# Patient Record
Sex: Male | Born: 1935
Health system: Southern US, Community
[De-identification: ages and names within clinical notes are randomized; demographics above are authoritative.]

## PROBLEM LIST (undated history)

## (undated) ENCOUNTER — Emergency Department (HOSPITAL_COMMUNITY): Payer: Medicare Other | Source: Home / Self Care

## (undated) DIAGNOSIS — R220 Localized swelling, mass and lump, head: Secondary | ICD-10-CM

## (undated) DIAGNOSIS — I4891 Unspecified atrial fibrillation: Secondary | ICD-10-CM

## (undated) DIAGNOSIS — K602 Anal fissure, unspecified: Secondary | ICD-10-CM

## (undated) DIAGNOSIS — C61 Malignant neoplasm of prostate: Secondary | ICD-10-CM

## (undated) DIAGNOSIS — R221 Localized swelling, mass and lump, neck: Secondary | ICD-10-CM

## (undated) DIAGNOSIS — I10 Essential (primary) hypertension: Secondary | ICD-10-CM

## (undated) HISTORY — DX: Localized swelling, mass and lump, head: R22.0

## (undated) HISTORY — PX: PROSTATE BIOPSY: SHX241

## (undated) HISTORY — DX: Localized swelling, mass and lump, neck: R22.1

## (undated) HISTORY — DX: Anal fissure, unspecified: K60.2

## (undated) HISTORY — DX: Essential (primary) hypertension: I10

## (undated) HISTORY — DX: Unspecified atrial fibrillation: I48.91

## (undated) HISTORY — PX: CARPAL TUNNEL RELEASE: SHX101

## (undated) HISTORY — PX: CYST REMOVAL HAND: SHX6279

---

## 1998-10-17 HISTORY — PX: CYSTOSCOPY: SUR368

## 2007-08-13 ENCOUNTER — Encounter: Payer: Self-pay | Admitting: Family Medicine

## 2009-06-11 ENCOUNTER — Ambulatory Visit: Payer: Self-pay | Admitting: Family Medicine

## 2009-06-11 DIAGNOSIS — K602 Anal fissure, unspecified: Secondary | ICD-10-CM | POA: Insufficient documentation

## 2009-06-11 DIAGNOSIS — I10 Essential (primary) hypertension: Secondary | ICD-10-CM

## 2009-06-11 DIAGNOSIS — I4891 Unspecified atrial fibrillation: Secondary | ICD-10-CM | POA: Insufficient documentation

## 2009-06-11 HISTORY — DX: Unspecified atrial fibrillation: I48.91

## 2009-06-11 HISTORY — DX: Essential (primary) hypertension: I10

## 2009-06-11 HISTORY — DX: Anal fissure, unspecified: K60.2

## 2009-06-17 ENCOUNTER — Encounter: Payer: Self-pay | Admitting: Family Medicine

## 2009-06-29 ENCOUNTER — Ambulatory Visit: Payer: Self-pay | Admitting: Family Medicine

## 2009-07-27 ENCOUNTER — Ambulatory Visit: Payer: Self-pay | Admitting: Family Medicine

## 2009-07-27 LAB — CONVERTED CEMR LAB: Prothrombin Time: 20.7 s

## 2009-08-11 ENCOUNTER — Ambulatory Visit: Payer: Self-pay | Admitting: Family Medicine

## 2009-08-24 ENCOUNTER — Ambulatory Visit: Payer: Self-pay | Admitting: Family Medicine

## 2009-08-24 LAB — CONVERTED CEMR LAB
INR: 2
Prothrombin Time: 17.6 s

## 2009-09-21 ENCOUNTER — Ambulatory Visit: Payer: Self-pay | Admitting: Family Medicine

## 2009-09-21 LAB — CONVERTED CEMR LAB: Prothrombin Time: 17.9 s

## 2009-10-19 ENCOUNTER — Ambulatory Visit: Payer: Self-pay | Admitting: Family Medicine

## 2009-10-19 LAB — CONVERTED CEMR LAB: INR: 1.9

## 2009-11-19 ENCOUNTER — Ambulatory Visit: Payer: Self-pay | Admitting: Family Medicine

## 2009-11-19 LAB — CONVERTED CEMR LAB
INR: 1.8
Prothrombin Time: 16.4 s

## 2009-12-01 ENCOUNTER — Telehealth: Payer: Self-pay | Admitting: Family Medicine

## 2009-12-16 ENCOUNTER — Ambulatory Visit: Payer: Self-pay | Admitting: Family Medicine

## 2009-12-16 LAB — CONVERTED CEMR LAB: Prothrombin Time: 16.7 s

## 2009-12-31 ENCOUNTER — Encounter: Payer: Self-pay | Admitting: Family Medicine

## 2010-01-20 ENCOUNTER — Ambulatory Visit: Payer: Self-pay | Admitting: Family Medicine

## 2010-01-20 LAB — CONVERTED CEMR LAB: INR: 2

## 2010-02-19 ENCOUNTER — Ambulatory Visit: Payer: Self-pay | Admitting: Family Medicine

## 2010-03-24 ENCOUNTER — Ambulatory Visit: Payer: Self-pay | Admitting: Family Medicine

## 2010-04-20 ENCOUNTER — Telehealth: Payer: Self-pay | Admitting: Family Medicine

## 2010-05-03 ENCOUNTER — Ambulatory Visit: Payer: Self-pay | Admitting: Family Medicine

## 2010-05-31 ENCOUNTER — Ambulatory Visit: Payer: Self-pay | Admitting: Family Medicine

## 2010-05-31 LAB — CONVERTED CEMR LAB: INR: 2.1

## 2010-07-01 ENCOUNTER — Ambulatory Visit: Payer: Self-pay | Admitting: Family Medicine

## 2010-07-01 LAB — CONVERTED CEMR LAB: INR: 2.5

## 2010-07-26 ENCOUNTER — Encounter: Payer: Self-pay | Admitting: Family Medicine

## 2010-08-04 ENCOUNTER — Ambulatory Visit: Payer: Self-pay | Admitting: Family Medicine

## 2010-08-04 LAB — CONVERTED CEMR LAB

## 2010-09-02 ENCOUNTER — Ambulatory Visit: Payer: Self-pay | Admitting: Family Medicine

## 2010-09-15 ENCOUNTER — Encounter: Payer: Self-pay | Admitting: Family Medicine

## 2010-09-20 ENCOUNTER — Encounter: Payer: Self-pay | Admitting: Family Medicine

## 2010-09-20 ENCOUNTER — Encounter: Admission: RE | Admit: 2010-09-20 | Discharge: 2010-09-20 | Payer: Self-pay | Admitting: Family Medicine

## 2010-09-22 ENCOUNTER — Encounter
Admission: RE | Admit: 2010-09-22 | Discharge: 2010-09-22 | Payer: Self-pay | Source: Home / Self Care | Admitting: Family Medicine

## 2010-09-22 ENCOUNTER — Encounter: Payer: Self-pay | Admitting: Family Medicine

## 2010-10-01 ENCOUNTER — Ambulatory Visit: Payer: Self-pay | Admitting: Family Medicine

## 2010-10-01 DIAGNOSIS — R221 Localized swelling, mass and lump, neck: Secondary | ICD-10-CM

## 2010-10-01 DIAGNOSIS — R220 Localized swelling, mass and lump, head: Secondary | ICD-10-CM

## 2010-10-01 HISTORY — DX: Localized swelling, mass and lump, head: R22.0

## 2010-10-05 ENCOUNTER — Ambulatory Visit: Payer: Self-pay | Admitting: Family Medicine

## 2010-10-07 ENCOUNTER — Telehealth: Payer: Self-pay | Admitting: Family Medicine

## 2010-10-12 ENCOUNTER — Encounter: Payer: Self-pay | Admitting: Family Medicine

## 2010-10-12 ENCOUNTER — Ambulatory Visit (HOSPITAL_COMMUNITY)
Admission: RE | Admit: 2010-10-12 | Discharge: 2010-10-12 | Payer: Self-pay | Source: Home / Self Care | Attending: Family Medicine | Admitting: Family Medicine

## 2010-10-14 ENCOUNTER — Telehealth: Payer: Self-pay | Admitting: Family Medicine

## 2010-10-27 ENCOUNTER — Ambulatory Visit
Admission: RE | Admit: 2010-10-27 | Discharge: 2010-10-27 | Payer: Self-pay | Source: Home / Self Care | Attending: Family Medicine | Admitting: Family Medicine

## 2010-11-16 NOTE — Assessment & Plan Note (Signed)
Summary: pt/njr------PT [facility] // RS  Nurse Visit   Allergies: No Known Drug Allergies Laboratory Results   Blood Tests   Date/Time Received: January 20, 2010 11:24 AM Date/Time Reported: January 20, 2010 11:24 AM  PT: 17.5 s   (Normal Range: 10.6-13.4)  INR: 2.0   (Normal Range: 0.88-1.12   Therap INR: 2.0-3.5) Comments: Wynona Canes, CMA  January 20, 2010 11:24 AM    Orders Added: 1)  Est. Patient Level II [99212] 2)  Protime [[id number]]   ANTICOAGULATION RECORD PREVIOUS REGIMEN & LAB RESULTS Anticoagulation Diagnosis:  v58.83,58.61,427.31 on  12/16/2009 Previous INR Goal Range:  2.0-3.0 on  12/16/2009 Previous INR:  1.8 on  12/16/2009 Previous Coumadin Dose(mg):  2.5mg  qd on  12/16/2009 Previous Regimen:  same on  11/19/2009  NEW REGIMEN & LAB RESULTS Current INR: 2.0 Current Coumadin Dose(mg): 2.5 mg qd Regimen: same  (no change)       Repeat testing in: 4 wks MEDICATIONS TOPROL XL 50 MG XR24H-TAB (METOPROLOL SUCCINATE) once daily COUMADIN 2.5 MG TABS (WARFARIN SODIUM) one tab at bedtime  NO generic substitutions   Anticoagulation Visit Questionnaire      Coumadin dose missed/changed:  No      Abnormal Bleeding Symptoms:  No   Any diet changes including alcohol intake, vegetables or greens since the last visit:  No Any illnesses or hospitalizations since the last visit:  No Any signs of clotting since the last visit (including chest discomfort, dizziness, shortness of breath, arm tingling, slurred speech, swelling or redness in leg):  No    Laboratory Results   Blood Tests     PT: 17.5 s   (Normal Range: 10.6-13.4)  INR: 2.0   (Normal Range: 0.88-1.12   Therap INR: 2.0-3.5) Comments: Wynona Canes, CMA  January 20, 2010 11:24 AM

## 2010-11-16 NOTE — Assessment & Plan Note (Signed)
Summary: pt//ccm/pt rsc/cjr  Nurse Visit   Allergies: No Known Drug Allergies  Comments:  Provider: pt states missed two doses of coumadin recently. Laboratory Results   Blood Tests   Date/Time Received: December 16, 2009 1:47 PM  Date/Time Reported: December 16, 2009 1:47 PM   PT: 16.7 s   (Normal Range: 10.6-13.4)  INR: 1.8   (Normal Range: 0.88-1.12   Therap INR: 2.0-3.5) Comments: Wynona Canes, CMA  December 16, 2009 1:48 PM     Orders Added: 1)  Est. Patient Level I [84132] 2)  Protime [[id number]]  Laboratory Results   Blood Tests     PT: 16.7 s   (Normal Range: 10.6-13.4)  INR: 1.8   (Normal Range: 0.88-1.12   Therap INR: 2.0-3.5) Comments: Wynona Canes, CMA  December 16, 2009 1:48 PM       ANTICOAGULATION RECORD PREVIOUS REGIMEN & LAB RESULTS   Previous INR:  1.8 on  11/19/2009 Previous Coumadin Dose(mg):  same on  09/21/2009 Previous Regimen:  same on  11/19/2009  NEW REGIMEN & LAB RESULTS Anticoag. Dx: v58.83,58.61,427.31 Current INR Goal Range: 2.0-3.0 Current INR: 1.8 Current Coumadin Dose(mg): 2.5mg  qd Regimen: same  (no change)       Repeat testing in: 4 weeks MEDICATIONS TOPROL XL 50 MG XR24H-TAB (METOPROLOL SUCCINATE) once daily COUMADIN 2.5 MG TABS (WARFARIN SODIUM) one tab at bedtime  NO generic substitutions   Anticoagulation Visit Questionnaire      Coumadin dose missed/changed:  Yes      Coumadin Dose Comments:  one or more missed dose(s)      Abnormal Bleeding Symptoms:  No   Any diet changes including alcohol intake, vegetables or greens since the last visit:  No Any illnesses or hospitalizations since the last visit:  No Any signs of clotting since the last visit (including chest discomfort, dizziness, shortness of breath, arm tingling, slurred speech, swelling or redness in leg):  No

## 2010-11-16 NOTE — Assessment & Plan Note (Signed)
Summary: PT/CJR  Nurse Visit   Allergies: No Known Drug Allergies Laboratory Results   Blood Tests     PT: 16.4 s   (Normal Range: 10.6-13.4)  INR: 1.8   (Normal Range: 0.88-1.12   Therap INR: 2.0-3.5) Comments: Rita Ohara  November 19, 2009 11:17 AM     Orders Added: 1)  Est. Patient Level I [99211] 2)  Protime [[id number]]   ANTICOAGULATION RECORD PREVIOUS REGIMEN & LAB RESULTS   Previous INR:  1.9 on  10/19/2009 Previous Coumadin Dose(mg):  same on  09/21/2009 Previous Regimen:  same on  07/27/2009  NEW REGIMEN & LAB RESULTS Current INR: 1.8 Regimen: same  Repeat testing in: 1 month  Anticoagulation Visit Questionnaire Coumadin dose missed/changed:  Yes Coumadin Dose Comments:  one or more missed dose(s) Abnormal Bleeding Symptoms:  No  Any diet changes including alcohol intake, vegetables or greens since the last visit:  No Any illnesses or hospitalizations since the last visit:  No Any signs of clotting since the last visit (including chest discomfort, dizziness, shortness of breath, arm tingling, slurred speech, swelling or redness in leg):  No  MEDICATIONS TOPROL XL 50 MG XR24H-TAB (METOPROLOL SUCCINATE) once daily COUMADIN 2.5 MG TABS (WARFARIN SODIUM) one tab at bedtime  NO generic substitutions

## 2010-11-16 NOTE — Letter (Signed)
Summary: [facility]  [facility]   Imported By: Maryln Gottron 07/29/2010 14:59:58  _____________________________________________________________________  External Attachment:    Type:   Image     Comment:   External Document

## 2010-11-16 NOTE — Assessment & Plan Note (Signed)
Summary: pt/njr  Nurse Visit   Allergies: No Known Drug Allergies Laboratory Results   Blood Tests      INR: 2.5   (Normal Range: 0.88-1.12   Therap INR: 2.0-3.5) Comments: Rita Ohara  July 01, 2010 10:18 AM     Orders Added: 1)  Est. Patient Level I [99211] 2)  Protime [[id number]]   ANTICOAGULATION RECORD PREVIOUS REGIMEN & LAB RESULTS Anticoagulation Diagnosis:  v58.83,58.61,427.31 on  12/16/2009 Previous INR Goal Range:  2.0-3.0 on  12/16/2009 Previous INR:  2.1 on  05/31/2010 Previous Coumadin Dose(mg):  2.5 mg qd on  01/20/2010 Previous Regimen:  same on  05/31/2010  NEW REGIMEN & LAB RESULTS Current INR: 2.5 Regimen: same  Repeat testing in: 4 weeks  Anticoagulation Visit Questionnaire Coumadin dose missed/changed:  No Abnormal Bleeding Symptoms:  No  Any diet changes including alcohol intake, vegetables or greens since the last visit:  No Any illnesses or hospitalizations since the last visit:  No Any signs of clotting since the last visit (including chest discomfort, dizziness, shortness of breath, arm tingling, slurred speech, swelling or redness in leg):  No  MEDICATIONS TOPROL XL 50 MG XR24H-TAB (METOPROLOL SUCCINATE) once daily COUMADIN 2.5 MG TABS (WARFARIN SODIUM) one tab at bedtime  NO generic substitutions

## 2010-11-16 NOTE — Assessment & Plan Note (Signed)
Summary: pt will come in fasting/also pt/njr   Vital Signs:  Patient profile:   77 year old male Height:      67.25 inches Weight:      179 pounds BMI:     27.93 Temp:     98.0 degrees F oral Pulse rate:   60 / minute Pulse rhythm:   regular Resp:     12 per minute BP sitting:   130 / 82  (left arm) Cuff size:   regular  Vitals Entered By: Sid Falcon LPN (August 04, 2010 10:16 AM)  Nutrition Counseling: Patient's BMI is greater than 25 and therefore counseled on weight management options.  History of Present Illness: Patient here for Medicare wellness visit and followup chronic medical problems. History of atrial fibrillation and hypertension. Chronic Coumadin therapy. No recent bleeding complications. compliant with all medications. Did have one recent fall couple months ago when he tripped on his feet. No instability. No recent chest pains, dizziness, headaches, or dyspnea  Here for Medicare AWV:  1.   Risk factors based on Past M, S, F history:  Hx hypertension and a fib on chronic coumadin therapy.  Hx macular degeneration.  Remote hx of kidney stones.  Ex-smoker.  Hx elevated PSA followed by urology.  Pos FH heart disease but no premature CAD. 2.   Physical Activities: very active,  active gardener. 3.   Depression/mood: No mood issues.  No depression or anxiety issues. 4.   Hearing:  scheduled to see audiologist next month in [place].  Subjective hearing loss. 6.   Fall Risk: No risk factors for fall.  No orthopedic problems.  Does have some macular degeneration and followed by ophthalmology. 7.   Home Safety: No risks identified.  No throw rugs. 8.   Height, weight, &visual acuity:  height and weight stable. Vision followed very closely by ophthalmologist so no rechecked today. 9.   Counseling: Needs flu vaccine.  He states prior pneumovax after age 49. 10.   Labs ordered based on risk factors: protime 11.           Referral Coordination  No referrals needed at this time.   Needs to continue with eye dr follow up. 12.           Care Plan  PVX up to date.  Cont monthly protimes and repeat today.  Flu vaccine. 13.            Cognitive Assessment  No defecits in short or long term memory.  No problems with reasoning skills.   Hypertension History:      He denies headache, chest pain, palpitations, dyspnea with exertion, orthopnea, PND, peripheral edema, visual symptoms, neurologic problems, syncope, and side effects from treatment.        Positive major cardiovascular risk factors include male age 91 years old or older and hypertension.  Negative major cardiovascular risk factors include non-tobacco-user status.     Clinical Review Panels:  Prevention   Last Colonoscopy:  normal (05/17/1994)  Immunizations   Last Flu Vaccine:  Fluvax 3+ (08/04/2010)  Diabetes Management   Last Flu Vaccine:  Fluvax 3+ (08/04/2010)   Allergies (verified): No Known Drug Allergies  Past History:  Past Medical History: Last updated: 06/11/2009 Blood in stool Chicken pox A fib Hypertension kidney stones  Past Surgical History: Last updated: 06/11/2009 Left hand cyst 2000  Family History: Last updated: 08/04/2010 Heart disease  Social History: Last updated: 06/11/2009 Retired  [employer] Married Previous  Smoker Alcohol use-no  Risk Factors: Smoking Status: never (06/11/2009) PMH-FH-SH reviewed for relevance  Family History: Heart disease  Review of Systems  The patient denies anorexia, fever, weight loss, weight gain, vision loss, decreased hearing, chest pain, syncope, dyspnea on exertion, peripheral edema, prolonged cough, headaches, hemoptysis, abdominal pain, melena, hematochezia, severe indigestion/heartburn, hematuria, muscle weakness, difficulty walking, and depression.    Physical Exam  General:  Well-developed,well-nourished,in no acute distress; alert,appropriate and cooperative throughout examination Head:  Normocephalic and  atraumatic without obvious abnormalities. No apparent alopecia or balding. Eyes:  pupils equal, pupils round, and pupils reactive to light.   Ears:  cerumen impaction bil. Mouth:  Oral mucosa and oropharynx without lesions or exudates.  Teeth in good repair. Neck:  No deformities, masses, or tenderness noted. Lungs:  Normal respiratory effort, chest expands symmetrically. Lungs are clear to auscultation, no crackles or wheezes. Heart:  Normal rate and regular rhythm. S1 and S2 normal without gallop, murmur, click, rub or other extra sounds. Abdomen:  Bowel sounds positive,abdomen soft and non-tender without masses, organomegaly or hernias noted. Rectal:  per urology last month. Msk:  No deformity or scoliosis noted of thoracic or lumbar spine.   Extremities:  No clubbing, cyanosis, edema, or deformity noted with normal full range of motion of all joints.   Neurologic:  alert & oriented X3, cranial nerves II-XII intact, and strength normal in all extremities.   Skin:  no rashes and no suspicious lesions.   Cervical Nodes:  No lymphadenopathy noted Psych:  normally interactive, good eye contact, not anxious appearing, and not depressed appearing.     Impression & Recommendations:  Problem # 1:  Preventive Health Care (ICD-V70.0) flu vaccine given.  Problem # 2:  HYPERTENSION (ICD-401.9)  His updated medication list for this problem includes:    Toprol Xl 50 Mg Xr24h-tab (Metoprolol succinate) ..... Once daily  Problem # 3:  ATRIAL FIBRILLATION (ICD-427.31)  His updated medication list for this problem includes:    Toprol Xl 50 Mg Xr24h-tab (Metoprolol succinate) ..... Once daily    Coumadin 2.5 Mg Tabs (Warfarin sodium) ..... One tab at bedtime  no generic substitutions  Orders: Protime (74259DG)  Complete Medication List: 1)  Toprol Xl 50 Mg Xr24h-tab (Metoprolol succinate) .... Once daily 2)  Coumadin 2.5 Mg Tabs (Warfarin sodium) .... One tab at bedtime  no generic  substitutions  Other Orders: Medicare -1st Annual Wellness Visit [phone]) Flu Vaccine 1yrs + MEDICARE PATIENTS (E3329) Administration Flu vaccine - MCR (G0008)  Hypertension Assessment/Plan:      The patient's hypertensive risk group is category B: At least one risk factor (excluding diabetes) with no target organ damage.  Today's blood pressure is 130/82.    Patient Instructions: 1)  remember to continue with monthly protimes   Orders Added: 1)  Medicare -1st Annual Wellness Visit [G0438] 2)  Est. Patient Level III [51884] 3)  Protime [[id number]] 4)  Flu Vaccine 23yrs + MEDICARE PATIENTS [Q2039] 5)  Administration Flu vaccine - MCR [G0008]      Flu Vaccine Consent Questions     Do you have a history of severe allergic reactions to this vaccine? no    Any prior history of allergic reactions to egg and/or gelatin? no    Do you have a sensitivity to the preservative Thimersol? no    Do you have a past history of Guillan-Barre Syndrome? no    Do you currently have an acute febrile illness? no    Have you ever  had a severe reaction to latex? no    Vaccine information given and explained to patient? yes    Are you currently pregnant? no    Lot Number:[id number]   Exp Date:04/16/2011   Site Given  Left Deltoid [email or web address]   ANTICOAGULATION RECORD PREVIOUS REGIMEN & LAB RESULTS Anticoagulation Diagnosis:  v58.83,58.61,427.31 on  12/16/2009 Previous INR Goal Range:  2.0-3.0 on  12/16/2009 Previous INR:  2.5 on  07/01/2010 Previous Coumadin Dose(mg):  2.5 mg qd on  01/20/2010 Previous Regimen:  same on  07/01/2010  NEW REGIMEN & LAB RESULTS Current INR: 2.2. Regimen: same  Repeat testing in: 4 weeks  Anticoagulation Visit Questionnaire Coumadin dose missed/changed:  No Abnormal Bleeding Symptoms:  No  Any diet changes including alcohol intake, vegetables or greens since the last visit:  No Any illnesses or hospitalizations since the last visit:  No Any signs of clotting since  the last visit (including chest discomfort, dizziness, shortness of breath, arm tingling, slurred speech, swelling or redness in leg):  No  MEDICATIONS TOPROL XL 50 MG XR24H-TAB (METOPROLOL SUCCINATE) once daily COUMADIN 2.5 MG TABS (WARFARIN SODIUM) one tab at bedtime  NO generic substitutions    Laboratory Results   Blood Tests      INR: 2.2.   (Normal Range: 0.88-1.12   Therap INR: 2.0-3.5)

## 2010-11-16 NOTE — Assessment & Plan Note (Signed)
Summary: PT/NJR  Nurse Visit   Allergies: No Known Drug Allergies Laboratory Results   Blood Tests     PT: 16.8 s   (Normal Range: 10.6-13.4)  INR: 1.9   (Normal Range: 0.88-1.12   Therap INR: 2.0-3.5) Comments: Joanne Chars CMA  October 19, 2009 10:56 AM     Orders Added: 1)  Est. Patient Level I [99211] 2)  Protime [[id number]]   ANTICOAGULATION RECORD PREVIOUS REGIMEN & LAB RESULTS   Previous INR:  2.1 on  09/21/2009 Previous Coumadin Dose(mg):  same on  09/21/2009 Previous Regimen:  same on  07/27/2009  NEW REGIMEN & LAB RESULTS Current INR: 1.9 Regimen: same  (no change)   Anticoagulation Visit Questionnaire Coumadin dose missed/changed:  Yes Coumadin Dose Comments:  one or more missed dose(s) Abnormal Bleeding Symptoms:  No  Any diet changes including alcohol intake, vegetables or greens since the last visit:  No Any illnesses or hospitalizations since the last visit:  Yes      Recent Illness/Hospitalizations:  "eye injections" Pt. has been taken Warfarin and not coumadin since 07/31/09 until 10/21/09 due to mail-order mix up. Any signs of clotting since the last visit (including chest discomfort, dizziness, shortness of breath, arm tingling, slurred speech, swelling or redness in leg):  No  MEDICATIONS TOPROL XL 50 MG XR24H-TAB (METOPROLOL SUCCINATE) once daily COUMADIN 2.5 MG TABS (WARFARIN SODIUM) one tab at bedtime  NO generic substitutions

## 2010-11-16 NOTE — Letter (Signed)
Summary: [facility]  [facility]   Imported By: Maryln Gottron 01/06/2010 15:33:44  _____________________________________________________________________  External Attachment:    Type:   Image     Comment:   External Document

## 2010-11-16 NOTE — Progress Notes (Signed)
Summary: Pt changed mail order RX to [facility] - refill of Toprol  Phone Note Call from Patient Call back at Home Phone [phone]   Caller: Patient Summary of Call: Pt called and is changing mail order pharmacies to [facility]. Pt is req refill of Toprol 50mg . Please send to [facility].  Initial call taken by: Lucy Antigua,  December 01, 2009 11:48 AM    Prescriptions: TOPROL XL 50 MG XR24H-TAB (METOPROLOL SUCCINATE) once daily  #90 x 3   Entered by:   Sid Falcon LPN   Authorized by:   Evelena Peat MD   Signed by:   Sid Falcon LPN on 14/78/2956   Method used:   Faxed to ...       [facility] Environmental education officer)       [address] [address]       [place], [place]  [address]       Ph: [phone]       Fax: [phone]   RxID:   [id number]

## 2010-11-16 NOTE — Assessment & Plan Note (Signed)
Summary: pt/njr  Nurse Visit   Allergies: No Known Drug Allergies Laboratory Results   Blood Tests      INR: 2.6   (Normal Range: 0.88-1.12   Therap INR: 2.0-3.5)    Orders Added: 1)  Est. Patient Level I [32671] 2)  Protime [[id number]]   ANTICOAGULATION RECORD PREVIOUS REGIMEN & LAB RESULTS Anticoagulation Diagnosis:  v58.83,58.61,427.31 on  12/16/2009 Previous INR Goal Range:  2.0-3.0 on  12/16/2009 Previous INR:  2.1 on  02/19/2010 Previous Coumadin Dose(mg):  2.5 mg qd on  01/20/2010 Previous Regimen:  same on  11/19/2009  NEW REGIMEN & LAB RESULTS Current INR: 2.6 Regimen: same  (no change)       Repeat testing in: 4 weeks MEDICATIONS TOPROL XL 50 MG XR24H-TAB (METOPROLOL SUCCINATE) once daily COUMADIN 2.5 MG TABS (WARFARIN SODIUM) one tab at bedtime  NO generic substitutions   Anticoagulation Visit Questionnaire      Coumadin dose missed/changed:  No      Abnormal Bleeding Symptoms:  No   Any diet changes including alcohol intake, vegetables or greens since the last visit:  No Any illnesses or hospitalizations since the last visit:  No Any signs of clotting since the last visit (including chest discomfort, dizziness, shortness of breath, arm tingling, slurred speech, swelling or redness in leg):  No

## 2010-11-16 NOTE — Assessment & Plan Note (Signed)
Summary: PT/NJR  Nurse Visit   Allergies: No Known Drug Allergies Laboratory Results   Blood Tests   Date/Time Received: Feb 19, 2010 1:37 PM  Date/Time Reported: Feb 19, 2010 1:37 PM   PT: 17.9 s   (Normal Range: 10.6-13.4)  INR: 2.1   (Normal Range: 0.88-1.12   Therap INR: 2.0-3.5) Comments: Wynona Canes, CMA  Feb 19, 2010 1:37 PM     Orders Added: 1)  Est. Patient Level I [61607] 2)  Protime [[id number]]  Laboratory Results   Blood Tests     PT: 17.9 s   (Normal Range: 10.6-13.4)  INR: 2.1   (Normal Range: 0.88-1.12   Therap INR: 2.0-3.5) Comments: Wynona Canes, CMA  Feb 19, 2010 1:37 PM       ANTICOAGULATION RECORD PREVIOUS REGIMEN & LAB RESULTS Anticoagulation Diagnosis:  v58.83,58.61,427.31 on  12/16/2009 Previous INR Goal Range:  2.0-3.0 on  12/16/2009 Previous INR:  2.0 on  01/20/2010 Previous Coumadin Dose(mg):  2.5 mg qd on  01/20/2010 Previous Regimen:  same on  11/19/2009  NEW REGIMEN & LAB RESULTS Current INR: 2.1 Regimen: same  (no change)       Repeat testing in: 4 weeks MEDICATIONS TOPROL XL 50 MG XR24H-TAB (METOPROLOL SUCCINATE) once daily COUMADIN 2.5 MG TABS (WARFARIN SODIUM) one tab at bedtime  NO generic substitutions   Anticoagulation Visit Questionnaire      Coumadin dose missed/changed:  No      Abnormal Bleeding Symptoms:  No   Any diet changes including alcohol intake, vegetables or greens since the last visit:  No Any illnesses or hospitalizations since the last visit:  No Any signs of clotting since the last visit (including chest discomfort, dizziness, shortness of breath, arm tingling, slurred speech, swelling or redness in leg):  No

## 2010-11-16 NOTE — Assessment & Plan Note (Signed)
Summary: pt//ccm wife rsc/njr  Nurse Visit   Allergies: No Known Drug Allergies Laboratory Results   Blood Tests      INR: 2.1   (Normal Range: 0.88-1.12   Therap INR: 2.0-3.5) Comments: Rita Ohara  May 03, 2010 9:59 AM     Orders Added: 1)  Est. Patient Level I [99211] 2)  Protime [[id number]]   ANTICOAGULATION RECORD PREVIOUS REGIMEN & LAB RESULTS Anticoagulation Diagnosis:  v58.83,58.61,427.31 on  12/16/2009 Previous INR Goal Range:  2.0-3.0 on  12/16/2009 Previous INR:  2.6 on  03/24/2010 Previous Coumadin Dose(mg):  2.5 mg qd on  01/20/2010 Previous Regimen:  same on  11/19/2009  NEW REGIMEN & LAB RESULTS Current INR: 2.1 Regimen: same  Repeat testing in: 4 weeks  Anticoagulation Visit Questionnaire Coumadin dose missed/changed:  No Abnormal Bleeding Symptoms:  No  Any diet changes including alcohol intake, vegetables or greens since the last visit:  No Any illnesses or hospitalizations since the last visit:  No Any signs of clotting since the last visit (including chest discomfort, dizziness, shortness of breath, arm tingling, slurred speech, swelling or redness in leg):  No  MEDICATIONS TOPROL XL 50 MG XR24H-TAB (METOPROLOL SUCCINATE) once daily COUMADIN 2.5 MG TABS (WARFARIN SODIUM) one tab at bedtime  NO generic substitutions

## 2010-11-16 NOTE — Assessment & Plan Note (Signed)
Summary: pt labs//ccm  Nurse Visit   Allergies: No Known Drug Allergies Laboratory Results   Blood Tests      INR: 2.1   (Normal Range: 0.88-1.12   Therap INR: 2.0-3.5) Comments: Kathrynn Speed CMA  May 31, 2010 10:30 AM     Orders Added: 1)  Est. Patient Level I [99211] 2)  Protime [[id number]]   ANTICOAGULATION RECORD PREVIOUS REGIMEN & LAB RESULTS Anticoagulation Diagnosis:  v58.83,58.61,427.31 on  12/16/2009 Previous INR Goal Range:  2.0-3.0 on  12/16/2009 Previous INR:  2.1 on  05/03/2010 Previous Coumadin Dose(mg):  2.5 mg qd on  01/20/2010 Previous Regimen:  same on  05/03/2010  NEW REGIMEN & LAB RESULTS Current INR: 2.1 Regimen: same  Repeat testing in: 4 weeks  Anticoagulation Visit Questionnaire Coumadin dose missed/changed:  Yes Coumadin Dose Comments:  Maybe one Abnormal Bleeding Symptoms:  No  Any diet changes including alcohol intake, vegetables or greens since the last visit:  No Any illnesses or hospitalizations since the last visit:  No Any signs of clotting since the last visit (including chest discomfort, dizziness, shortness of breath, arm tingling, slurred speech, swelling or redness in leg):  No  MEDICATIONS TOPROL XL 50 MG XR24H-TAB (METOPROLOL SUCCINATE) once daily COUMADIN 2.5 MG TABS (WARFARIN SODIUM) one tab at bedtime  NO generic substitutions

## 2010-11-16 NOTE — Progress Notes (Signed)
Summary: Pt req Brand Name Only Coumadin  Phone Note Refill Request   Refills Requested: Medication #1:  COUMADIN 2.5 MG TABS one tab at bedtime  NO generic substitutions.   Dosage confirmed as above?Dosage Confirmed   Brand Name Necessary? Yes Pt called and wants to be sure that Coumadin is Brand Name Only. Please call in to [facility] in [place] [phone]    Method Requested: Telephone to Pharmacy Initial call taken by: Lucy Antigua,  April 20, 2010 10:28 AM    Prescriptions: COUMADIN 2.5 MG TABS (WARFARIN SODIUM) one tab at bedtime  NO generic substitutions  #30 x 6   Entered by:   Sid Falcon LPN   Authorized by:   Evelena Peat MD   Signed by:   Sid Falcon LPN on 09/81/1914   Method used:   Telephoned to ...       [facility]* (retail)       [address]       [place], [place]  [address]       Ph: [phone]       Fax: [phone]   RxID:   [id number]

## 2010-11-18 NOTE — Assessment & Plan Note (Signed)
Summary: lump on shoulder/njr   Vital Signs:  Patient profile:   77 year old male Weight:      179 pounds Temp:     97.7 degrees F oral BP sitting:   150 / 80  (left arm) Cuff size:   regular  Vitals Entered By: Sid Falcon LPN (October 01, 2010 10:07 AM)  History of Present Illness: Patient seen with left lower neck and supraclavicular mass.  History is that he fell on 09/15/2000 against the door frame. Had left shoulder pain. Plain x-rays revealed degenerative arthritis at the a.c. joint but no acute finding. Patient was noted to incidentally have mass as above. Was sent for ultrasound of the neck which revealed 4 cm cystic and solid left neck mass. MRI recommended with and without contrast to further evaluate.  MRI scan revealed probably cystic mass which is indeterminate. Differential included lymphocele, atypical brachial cleft cyst, cystic hygroma or cystic neoplasm. Fine-needle aspiration for cytology recommended to further evaluate.  Patient not had any other adenopathy or other masses. No appetite or weight changes. No fevers, chills, or night sweats. No cough.  Allergies (verified): No Known Drug Allergies  Past History:  Past Medical History: Last updated: 06/11/2009 Blood in stool Chicken pox A fib Hypertension kidney stones  Past Surgical History: Last updated: 06/11/2009 Left hand cyst 2000  Family History: Last updated: 08/04/2010 Heart disease  Social History: Last updated: 06/11/2009 Retired  [employer] Married Previous Smoker Alcohol use-no  Risk Factors: Smoking Status: never (06/11/2009)  Review of Systems  The patient denies anorexia, fever, weight loss, hoarseness, chest pain, syncope, dyspnea on exertion, peripheral edema, prolonged cough, headaches, hemoptysis, abdominal pain, melena, hematochezia, severe indigestion/heartburn, and muscle weakness.    Physical Exam  General:  Well-developed,well-nourished,in no acute  distress; alert,appropriate and cooperative throughout examination Mouth:  Oral mucosa and oropharynx without lesions or exudates.  Teeth in good repair. Neck:  no adenopathy appreciated.  approximately 4 cm movable nontender well rounded mass left lower neck Lungs:  Normal respiratory effort, chest expands symmetrically. Lungs are clear to auscultation, no crackles or wheezes. Heart:  Normal rate and regular rhythm. S1 and S2 normal without gallop, murmur, click, rub or other extra sounds. Abdomen:  soft and non-tender.   Extremities:  no edema   Impression & Recommendations:  Problem # 1:  NECK MASS (ICD-784.2) Assessment New Hopefully benign.  Since indeterminate from MRI, set up FNA. set up fine needle aspiration for cytology  Orders: Radiology Referral (Radiology)  Problem # 2:  ATRIAL FIBRILLATION (ICD-427.31)  His updated medication list for this problem includes:    Toprol Xl 50 Mg Xr24h-tab (Metoprolol succinate) ..... Once daily    Coumadin 2.5 Mg Tabs (Warfarin sodium) ..... One tab at bedtime  no generic substitutions  Complete Medication List: 1)  Toprol Xl 50 Mg Xr24h-tab (Metoprolol succinate) .... Once daily 2)  Coumadin 2.5 Mg Tabs (Warfarin sodium) .... One tab at bedtime  no generic substitutions  Patient Instructions: 1)  We will call you regarding tissue biopsy of L neck mass.   Orders Added: 1)  Radiology Referral [Radiology] 2)  Est. Patient Level IV [04540]

## 2010-11-18 NOTE — Progress Notes (Signed)
  Phone Note Outgoing Call   Summary of Call: spoke with pt.  L supraclav cyst bxed and path received.  No malignant cells. pt notified.  He is back on Coumadin at this time and will repeat INR 2 weeks. Initial call taken by: Evelena Peat MD,  October 14, 2010 5:20 PM

## 2010-11-18 NOTE — Progress Notes (Signed)
Summary: D/C coumadin order request  Phone Note From Other Clinic   Caller: Referral Coordinator Call For: Burchette Summary of Call: Anna from [facility] called, tenatively has scheduled left neck mas tissue sampling for Tues, 12/27.  Pt needs to be off Coumadin X 4 days.  If approved, requesting written order faxed to her today.  Pt is scheduled for coumadin lab check Tuesday 12/27.  Should this be cancelled for now? Stop Coumadin 12/23, may resume 12/27 evening (pt reports taking coumadin before bedtime)  Fax # [phone], to talk to [name], call [phone] Initial call taken by: Sid Falcon LPN,  October 07, 2010 11:41 AM  Follow-up for Phone Call        written to stop 4 days prior. Follow-up by: Evelena Peat MD,  October 07, 2010 11:54 AM  Additional Follow-up for Phone Call Additional follow up Details #1::        Faxed, confirmation received. Pt informed he is not to come to Lab appt on 12/27 for coumadin check.  He must wait 2 weeks and then check-it Additional Follow-up by: Sid Falcon LPN,  October 07, 2010 5:41 PM

## 2010-11-18 NOTE — Assessment & Plan Note (Signed)
Summary: pt/njr  Nurse Visit   Allergies: No Known Drug Allergies Laboratory Results   Blood Tests   Date/Time Received: October 27, 2010 11:09 AM  Date/Time Reported: October 27, 2010 11:09 AM    INR: 2.0   (Normal Range: 0.88-1.12   Therap INR: 2.0-3.5) Comments: Wynona Canes, CMA  October 27, 2010 11:10 AM     Orders Added: 1)  New Patient Level I [99201] 2)  Protime [60454UJ]  Laboratory Results   Blood Tests      INR: 2.0   (Normal Range: 0.88-1.12   Therap INR: 2.0-3.5) Comments: Wynona Canes, CMA  October 27, 2010 11:10 AM       ANTICOAGULATION RECORD PREVIOUS REGIMEN & LAB RESULTS Anticoagulation Diagnosis:  v58.83,58.61,427.31 on  12/16/2009 Previous INR Goal Range:  2.0-3.0 on  12/16/2009 Previous INR:  1.9 on  09/02/2010 Previous Coumadin Dose(mg):  2.5 mg qd on  01/20/2010 Previous Regimen:  same on  09/02/2010  NEW REGIMEN & LAB RESULTS Current INR: 2.0 Regimen: same  (no change) Coagulation Comments: Missed 5 days due to a procedure performed.      Repeat testing in: 4 weeks MEDICATIONS TOPROL XL 50 MG XR24H-TAB (METOPROLOL SUCCINATE) once daily COUMADIN 2.5 MG TABS (WARFARIN SODIUM) one tab at bedtime  NO generic substitutions   Anticoagulation Visit Questionnaire      Coumadin dose missed/changed:  Yes      Coumadin Dose Comments:  one or more missed dose(s)      Abnormal Bleeding Symptoms:  No   Any diet changes including alcohol intake, vegetables or greens since the last visit:  No Any illnesses or hospitalizations since the last visit:  No Any signs of clotting since the last visit (including chest discomfort, dizziness, shortness of breath, arm tingling, slurred speech, swelling or redness in leg):  No

## 2010-11-18 NOTE — Letter (Signed)
Summary: [facility]  [facility]   Imported By: Maryln Gottron 10/22/2010 15:30:09  _____________________________________________________________________  External Attachment:    Type:   Image     Comment:   External Document

## 2010-11-26 ENCOUNTER — Other Ambulatory Visit: Payer: Self-pay

## 2010-12-01 ENCOUNTER — Other Ambulatory Visit (INDEPENDENT_AMBULATORY_CARE_PROVIDER_SITE_OTHER): Payer: Medicare Other | Admitting: Family Medicine

## 2010-12-01 DIAGNOSIS — I4891 Unspecified atrial fibrillation: Secondary | ICD-10-CM

## 2010-12-01 NOTE — Patient Instructions (Signed)
.  aticoag

## 2010-12-27 LAB — CBC
HCT: 38.3 % — ABNORMAL LOW (ref 39.0–52.0)
Hemoglobin: 12.6 g/dL — ABNORMAL LOW (ref 13.0–17.0)
MCH: 26.2 pg (ref 26.0–34.0)
RBC: 4.81 MIL/uL (ref 4.22–5.81)
WBC: 7 10*3/uL (ref 4.0–10.5)

## 2010-12-27 LAB — APTT: aPTT: 33 seconds (ref 24–37)

## 2010-12-29 ENCOUNTER — Ambulatory Visit: Payer: Medicare Other

## 2010-12-29 ENCOUNTER — Other Ambulatory Visit: Payer: Self-pay | Admitting: *Deleted

## 2010-12-29 DIAGNOSIS — I4891 Unspecified atrial fibrillation: Secondary | ICD-10-CM

## 2010-12-29 DIAGNOSIS — I10 Essential (primary) hypertension: Secondary | ICD-10-CM

## 2010-12-29 LAB — POCT INR: INR: 1.8

## 2010-12-29 MED ORDER — METOPROLOL SUCCINATE ER 50 MG PO TB24: 50.0000 mg | ORAL_TABLET | Freq: Every day | ORAL | Status: DC

## 2010-12-30 ENCOUNTER — Other Ambulatory Visit: Payer: Self-pay | Admitting: *Deleted

## 2010-12-30 DIAGNOSIS — I4891 Unspecified atrial fibrillation: Secondary | ICD-10-CM

## 2010-12-30 MED ORDER — WARFARIN SODIUM 2.5 MG PO TABS: 2.5000 mg | ORAL_TABLET | Freq: Every day | ORAL | Status: DC

## 2010-12-31 ENCOUNTER — Other Ambulatory Visit: Payer: Self-pay | Admitting: Family Medicine

## 2010-12-31 ENCOUNTER — Telehealth: Payer: Self-pay | Admitting: Family Medicine

## 2010-12-31 NOTE — Telephone Encounter (Signed)
Refill coumadin at [facility] pharmacy.

## 2010-12-31 NOTE — Telephone Encounter (Signed)
Rx was sent electronically yesterday, Rx called in today as well, pt informed

## 2011-01-31 ENCOUNTER — Other Ambulatory Visit: Payer: Self-pay | Admitting: *Deleted

## 2011-01-31 ENCOUNTER — Other Ambulatory Visit (INDEPENDENT_AMBULATORY_CARE_PROVIDER_SITE_OTHER): Payer: Medicare Other

## 2011-01-31 DIAGNOSIS — I4891 Unspecified atrial fibrillation: Secondary | ICD-10-CM

## 2011-01-31 LAB — POCT INR: INR: 2.2

## 2011-01-31 MED ORDER — WARFARIN SODIUM 2.5 MG PO TABS: 2.5000 mg | ORAL_TABLET | Freq: Every day | ORAL | Status: DC

## 2011-01-31 MED ORDER — COUMADIN 2.5 MG PO TABS: 2.5000 mg | ORAL_TABLET | Freq: Every day | ORAL | Status: DC

## 2011-01-31 NOTE — Patient Instructions (Signed)
Same dose, 2.5 mg Everyday, check in 4 weeks

## 2011-01-31 NOTE — Telephone Encounter (Signed)
Pt here for PT/INR. Requesting non generic Coumadin 2.5 from now on, sent new Rx for Coumadin DAW per pt request sent to [facility]

## 2011-03-02 ENCOUNTER — Ambulatory Visit (INDEPENDENT_AMBULATORY_CARE_PROVIDER_SITE_OTHER): Payer: Medicare Other | Admitting: Family Medicine

## 2011-03-02 DIAGNOSIS — I4891 Unspecified atrial fibrillation: Secondary | ICD-10-CM

## 2011-03-02 NOTE — Patient Instructions (Signed)
Same dose, 2.5 mg Everyday, check in 4 weeks  

## 2011-03-30 ENCOUNTER — Ambulatory Visit (INDEPENDENT_AMBULATORY_CARE_PROVIDER_SITE_OTHER): Payer: Medicare Other | Admitting: Family Medicine

## 2011-03-30 DIAGNOSIS — I4891 Unspecified atrial fibrillation: Secondary | ICD-10-CM

## 2011-03-30 LAB — POCT INR: INR: 1.8

## 2011-03-30 NOTE — Patient Instructions (Signed)
Same dose 

## 2011-04-26 ENCOUNTER — Ambulatory Visit (INDEPENDENT_AMBULATORY_CARE_PROVIDER_SITE_OTHER): Payer: Medicare Other | Admitting: Family Medicine

## 2011-04-26 DIAGNOSIS — I4891 Unspecified atrial fibrillation: Secondary | ICD-10-CM

## 2011-04-26 NOTE — Patient Instructions (Signed)
Same dose Check 4 weeks

## 2011-05-27 ENCOUNTER — Ambulatory Visit (INDEPENDENT_AMBULATORY_CARE_PROVIDER_SITE_OTHER): Payer: Medicare Other | Admitting: Family Medicine

## 2011-05-27 DIAGNOSIS — I4891 Unspecified atrial fibrillation: Secondary | ICD-10-CM

## 2011-05-27 MED ORDER — WARFARIN SODIUM 2.5 MG PO TABS: 2.5000 mg | ORAL_TABLET | Freq: Every day | ORAL | Status: DC

## 2011-06-27 ENCOUNTER — Ambulatory Visit: Payer: Medicare Other

## 2011-06-28 ENCOUNTER — Ambulatory Visit: Payer: Medicare Other

## 2011-06-28 DIAGNOSIS — I4891 Unspecified atrial fibrillation: Secondary | ICD-10-CM

## 2011-06-28 LAB — POCT INR: INR: 2.9

## 2011-06-28 NOTE — Patient Instructions (Signed)
Same dose, 2.5 mg Everyday, check in 4 weeks

## 2011-08-01 ENCOUNTER — Ambulatory Visit: Payer: Medicare Other

## 2011-08-08 ENCOUNTER — Ambulatory Visit (INDEPENDENT_AMBULATORY_CARE_PROVIDER_SITE_OTHER): Payer: Medicare Other | Admitting: Family Medicine

## 2011-08-08 ENCOUNTER — Encounter: Payer: Self-pay | Admitting: Family Medicine

## 2011-08-08 VITALS — BP 140/82 | HR 60 | Temp 98.4°F | Resp 12 | Ht 67.0 in | Wt 182.0 lb

## 2011-08-08 DIAGNOSIS — Z0000 Encounter for general adult medical examination without abnormal findings: Secondary | ICD-10-CM

## 2011-08-08 DIAGNOSIS — I4891 Unspecified atrial fibrillation: Secondary | ICD-10-CM

## 2011-08-08 DIAGNOSIS — Z23 Encounter for immunization: Secondary | ICD-10-CM

## 2011-08-08 DIAGNOSIS — I10 Essential (primary) hypertension: Secondary | ICD-10-CM

## 2011-08-08 DIAGNOSIS — E785 Hyperlipidemia, unspecified: Secondary | ICD-10-CM

## 2011-08-08 DIAGNOSIS — H9190 Unspecified hearing loss, unspecified ear: Secondary | ICD-10-CM | POA: Insufficient documentation

## 2011-08-08 DIAGNOSIS — H3530 Unspecified macular degeneration: Secondary | ICD-10-CM | POA: Insufficient documentation

## 2011-08-08 LAB — CBC WITH DIFFERENTIAL/PLATELET
Basophils Relative: 0.3 % (ref 0.0–3.0)
Eosinophils Absolute: 0.2 10*3/uL (ref 0.0–0.7)
Eosinophils Relative: 3.4 % (ref 0.0–5.0)
HCT: 38.4 % — ABNORMAL LOW (ref 39.0–52.0)
Hemoglobin: 12.6 g/dL — ABNORMAL LOW (ref 13.0–17.0)
MCHC: 32.8 g/dL (ref 30.0–36.0)
MCV: 79.6 fl (ref 78.0–100.0)
Monocytes Absolute: 0.7 10*3/uL (ref 0.1–1.0)
Neutro Abs: 3.4 10*3/uL (ref 1.4–7.7)
RBC: 4.82 Mil/uL (ref 4.22–5.81)
WBC: 5.8 10*3/uL (ref 4.5–10.5)

## 2011-08-08 LAB — BASIC METABOLIC PANEL
BUN: 12 mg/dL (ref 6–23)
Calcium: 9 mg/dL (ref 8.4–10.5)
Creatinine, Ser: 1.2 mg/dL (ref 0.4–1.5)
GFR: 62.66 mL/min (ref >60.00)
Potassium: 4.8 mEq/L (ref 3.5–5.1)

## 2011-08-08 LAB — LIPID PANEL: Cholesterol: 141 mg/dL (ref 0–200)

## 2011-08-08 MED ORDER — CLOTRIMAZOLE-BETAMETHASONE 1-0.05 % EX CREA: TOPICAL_CREAM | Freq: Two times a day (BID) | CUTANEOUS | Status: DC

## 2011-08-08 NOTE — Patient Instructions (Signed)
  Latest dosing instructions   Total Sun Mon Tue Wed Thu Fri Sat   17.5 2.5 mg 2.5 mg 2.5 mg 2.5 mg 2.5 mg 2.5 mg 2.5 mg    (2.5 mg1) (2.5 mg1) (2.5 mg1) (2.5 mg1) (2.5 mg1) (2.5 mg1) (2.5 mg1)        

## 2011-08-08 NOTE — Progress Notes (Signed)
Subjective:    Patient ID: Mark Wu, male    DOB: [birth year], 77 y.o.   MRN: [id number]  HPI  Patient here for Medicare wellness exam and followup chronic medical problems. Past medical history, social history, and family history reviewed as below. He has history of atrial fibrillation maintained on chronic Coumadin. Takes metoprolol 50 mg daily. No recent bleeding problems. Needs flu vaccine. Pneumovax he estimates 7 years ago. Colonoscopy back in the 1990s and he does not wish to have repeated this time. Prior history of elevated PSA followed by urology. Macular degeneration followed by ophthalmology. Recent hearing aids which are helping.  1.  Risk factors based on Past Medical , Social, and Family history  reviewed and as below 2.  Limitations in physical activities  very active physically. Low risk for falls 3.  Depression/mood no depression or anxiety issues 4.  Hearing chronic bilateral sensorineural hearing loss. Recent hearing aids and has seen the audiologist 5.  ADLs fully independent in all 6.  Cognitive function (orientation to time and place, language, writing, speech,memory) no short or long term memory impairment. Judgment intact. 7.  Home Safety no issues identified 8.  Height, weight, and visual acuity. Height and weight stable. Vision followed closely by ophthalmologist. He sees ophthalmologist every 5 weeks for his macular degeneration 9.  Counseling discussed the importance of ongoing exercise 10. Recommendation of preventive services. Flu vaccine given. Discussed pros and cons of repeat colonoscopy. He is aware he would have to be off Coumadin for this. He declines at this time 25. Labs based on risk factors lipid panel, basic metabolic panel, CBC, and INR 12. Care Plan as above  Past Medical History  Diagnosis Date  . HYPERTENSION 06/11/2009  . Atrial fibrillation 06/11/2009  . Anal fissure 06/11/2009  . NECK MASS 10/01/2010   Past Surgical History  Procedure  Date  . Cystoscopy 2000    hand    reports that he quit smoking about 20 years ago. His smoking use included Cigars. He does not have any smokeless tobacco history on file. His alcohol and drug histories not on file. family history is not on file. No Known Allergies     Review of Systems  Constitutional: Negative for fever, chills, appetite change, fatigue and unexpected weight change.  HENT: Positive for hearing loss. Negative for ear pain, trouble swallowing and tinnitus.   Eyes: Negative for pain.  Respiratory: Negative for cough and shortness of breath.   Cardiovascular: Negative for chest pain, palpitations and leg swelling.  Gastrointestinal: Negative for nausea, vomiting, abdominal pain, diarrhea and blood in stool.  Genitourinary: Negative for dysuria.  Musculoskeletal: Negative for back pain.  Skin: Negative for rash.  Neurological: Negative for dizziness, weakness and headaches.  Hematological: Negative for adenopathy. Does not bruise/bleed easily.  Psychiatric/Behavioral: Negative for confusion and dysphoric mood.       Objective:   Physical Exam  Constitutional: He is oriented to person, place, and time. He appears well-developed and well-nourished.  HENT:  Mouth/Throat: Oropharynx is clear and moist.       Moderate cerumen right canal. Left is clear  Eyes: Right eye exhibits no discharge. Left eye exhibits no discharge.  Neck: Neck supple. No thyromegaly present.  Cardiovascular: Normal rate and regular rhythm.  Exam reveals no gallop.        Appears to be in sinus rhythm today with regular rhythm  Pulmonary/Chest: Effort normal and breath sounds normal. No respiratory distress. He has no wheezes. He has no  rales.  Abdominal: Soft. He exhibits no distension. There is no tenderness. There is no rebound and no guarding.  Genitourinary:       Per urology  Musculoskeletal: He exhibits no edema.  Lymphadenopathy:    He has no cervical adenopathy.  Neurological: He  is alert and oriented to person, place, and time. No cranial nerve deficit.  Skin: No rash noted.  Psychiatric: He has a normal mood and affect. His behavior is normal.          Assessment & Plan:  #1 health maintenance. Flu vaccine given. Confirm date of last tetanus. Discussed colonoscopy and patient declines. #2 history of atrial fibrillation on Coumadin. Recheck INR. #3 hypertension stable continue metoprolol.

## 2011-08-10 NOTE — Progress Notes (Signed)
Quick Note:  Pt informed ______ 

## 2011-09-13 ENCOUNTER — Ambulatory Visit (INDEPENDENT_AMBULATORY_CARE_PROVIDER_SITE_OTHER): Payer: Medicare Other | Admitting: Family Medicine

## 2011-09-13 DIAGNOSIS — I4891 Unspecified atrial fibrillation: Secondary | ICD-10-CM

## 2011-09-13 NOTE — Patient Instructions (Signed)
  Latest dosing instructions   Total Sun Mon Tue Wed Thu Fri Sat   17.5 2.5 mg 2.5 mg 2.5 mg 2.5 mg 2.5 mg 2.5 mg 2.5 mg    (2.5 mg1) (2.5 mg1) (2.5 mg1) (2.5 mg1) (2.5 mg1) (2.5 mg1) (2.5 mg1)        

## 2011-10-13 ENCOUNTER — Ambulatory Visit: Payer: Medicare Other

## 2011-10-13 DIAGNOSIS — I4891 Unspecified atrial fibrillation: Secondary | ICD-10-CM

## 2011-10-13 NOTE — Patient Instructions (Signed)
  Latest dosing instructions   Total Sun Mon Tue Wed Thu Fri Sat   17.5 2.5 mg 2.5 mg 2.5 mg 2.5 mg 2.5 mg 2.5 mg 2.5 mg    (2.5 mg1) (2.5 mg1) (2.5 mg1) (2.5 mg1) (2.5 mg1) (2.5 mg1) (2.5 mg1)        

## 2011-10-19 ENCOUNTER — Ambulatory Visit: Payer: Medicare Other

## 2011-10-19 DIAGNOSIS — H35059 Retinal neovascularization, unspecified, unspecified eye: Secondary | ICD-10-CM | POA: Diagnosis not present

## 2011-10-19 DIAGNOSIS — H353290 Exudative age-related macular degeneration, unspecified eye, stage unspecified: Secondary | ICD-10-CM | POA: Diagnosis not present

## 2011-11-14 ENCOUNTER — Other Ambulatory Visit: Payer: Self-pay | Admitting: *Deleted

## 2011-11-14 ENCOUNTER — Ambulatory Visit (INDEPENDENT_AMBULATORY_CARE_PROVIDER_SITE_OTHER): Payer: Medicare Other | Admitting: *Deleted

## 2011-11-14 DIAGNOSIS — I4891 Unspecified atrial fibrillation: Secondary | ICD-10-CM

## 2011-11-14 DIAGNOSIS — I10 Essential (primary) hypertension: Secondary | ICD-10-CM

## 2011-11-14 DIAGNOSIS — Z7901 Long term (current) use of anticoagulants: Secondary | ICD-10-CM

## 2011-11-14 MED ORDER — METOPROLOL SUCCINATE ER 50 MG PO TB24: 50.0000 mg | ORAL_TABLET | Freq: Every day | ORAL | Status: DC

## 2011-11-24 DIAGNOSIS — H35059 Retinal neovascularization, unspecified, unspecified eye: Secondary | ICD-10-CM | POA: Diagnosis not present

## 2011-11-24 DIAGNOSIS — H353290 Exudative age-related macular degeneration, unspecified eye, stage unspecified: Secondary | ICD-10-CM | POA: Diagnosis not present

## 2011-12-12 ENCOUNTER — Ambulatory Visit (INDEPENDENT_AMBULATORY_CARE_PROVIDER_SITE_OTHER): Payer: Medicare Other | Admitting: *Deleted

## 2011-12-12 ENCOUNTER — Telehealth: Payer: Self-pay | Admitting: *Deleted

## 2011-12-12 DIAGNOSIS — Z7901 Long term (current) use of anticoagulants: Secondary | ICD-10-CM | POA: Diagnosis not present

## 2011-12-12 DIAGNOSIS — I4891 Unspecified atrial fibrillation: Secondary | ICD-10-CM

## 2011-12-12 LAB — POCT INR: INR: 2.3

## 2011-12-12 NOTE — Telephone Encounter (Signed)
Pt here for PT/INR, he had a question about coumadin.  He is having 4 teeth extracted on Friday, March 8.  When should he stop taking coumadin? When to resume?

## 2011-12-12 NOTE — Patient Instructions (Signed)
  Latest dosing instructions   Total Sun Mon Tue Wed Thu Fri Sat   17.5 2.5 mg 2.5 mg 2.5 mg 2.5 mg 2.5 mg 2.5 mg 2.5 mg    (2.5 mg1) (2.5 mg1) (2.5 mg1) (2.5 mg1) (2.5 mg1) (2.5 mg1) (2.5 mg1)        

## 2011-12-12 NOTE — Telephone Encounter (Signed)
Pt informed on home personally identified VM 

## 2011-12-12 NOTE — Telephone Encounter (Signed)
Stop 2 days prior to dental work and start back as soon as possible after teeth pulled if approved by dentist

## 2011-12-27 DIAGNOSIS — H35059 Retinal neovascularization, unspecified, unspecified eye: Secondary | ICD-10-CM | POA: Diagnosis not present

## 2011-12-27 DIAGNOSIS — H353290 Exudative age-related macular degeneration, unspecified eye, stage unspecified: Secondary | ICD-10-CM | POA: Diagnosis not present

## 2012-01-10 ENCOUNTER — Ambulatory Visit: Payer: Medicare Other

## 2012-01-11 ENCOUNTER — Ambulatory Visit (INDEPENDENT_AMBULATORY_CARE_PROVIDER_SITE_OTHER): Payer: Medicare Other

## 2012-01-11 DIAGNOSIS — I4891 Unspecified atrial fibrillation: Secondary | ICD-10-CM

## 2012-01-11 DIAGNOSIS — Z7901 Long term (current) use of anticoagulants: Secondary | ICD-10-CM | POA: Diagnosis not present

## 2012-01-11 LAB — POCT INR: INR: 2.3

## 2012-01-11 NOTE — Patient Instructions (Signed)
  Latest dosing instructions   Total Sun Mon Tue Wed Thu Fri Sat   17.5 2.5 mg 2.5 mg 2.5 mg 2.5 mg 2.5 mg 2.5 mg 2.5 mg    (2.5 mg1) (2.5 mg1) (2.5 mg1) (2.5 mg1) (2.5 mg1) (2.5 mg1) (2.5 mg1)        

## 2012-01-31 DIAGNOSIS — H353290 Exudative age-related macular degeneration, unspecified eye, stage unspecified: Secondary | ICD-10-CM | POA: Diagnosis not present

## 2012-01-31 DIAGNOSIS — H35059 Retinal neovascularization, unspecified, unspecified eye: Secondary | ICD-10-CM | POA: Diagnosis not present

## 2012-02-10 ENCOUNTER — Ambulatory Visit: Payer: Medicare Other

## 2012-02-15 ENCOUNTER — Ambulatory Visit (INDEPENDENT_AMBULATORY_CARE_PROVIDER_SITE_OTHER): Payer: Medicare Other | Admitting: Family Medicine

## 2012-02-15 DIAGNOSIS — I4891 Unspecified atrial fibrillation: Secondary | ICD-10-CM | POA: Diagnosis not present

## 2012-02-15 LAB — POCT INR: INR: 2.2

## 2012-02-15 NOTE — Patient Instructions (Signed)
  Latest dosing instructions   Total Sun Mon Tue Wed Thu Fri Sat   17.5 2.5 mg 2.5 mg 2.5 mg 2.5 mg 2.5 mg 2.5 mg 2.5 mg    (2.5 mg1) (2.5 mg1) (2.5 mg1) (2.5 mg1) (2.5 mg1) (2.5 mg1) (2.5 mg1)        

## 2012-03-14 DIAGNOSIS — H35059 Retinal neovascularization, unspecified, unspecified eye: Secondary | ICD-10-CM | POA: Diagnosis not present

## 2012-03-14 DIAGNOSIS — H353290 Exudative age-related macular degeneration, unspecified eye, stage unspecified: Secondary | ICD-10-CM | POA: Diagnosis not present

## 2012-03-20 ENCOUNTER — Ambulatory Visit (INDEPENDENT_AMBULATORY_CARE_PROVIDER_SITE_OTHER): Payer: Medicare Other | Admitting: Family

## 2012-03-20 DIAGNOSIS — I4891 Unspecified atrial fibrillation: Secondary | ICD-10-CM

## 2012-03-20 NOTE — Patient Instructions (Signed)
  Latest dosing instructions   Total Sun Mon Tue Wed Thu Fri Sat   17.5 2.5 mg 2.5 mg 2.5 mg 2.5 mg 2.5 mg 2.5 mg 2.5 mg    (2.5 mg1) (2.5 mg1) (2.5 mg1) (2.5 mg1) (2.5 mg1) (2.5 mg1) (2.5 mg1)        

## 2012-04-12 DIAGNOSIS — L821 Other seborrheic keratosis: Secondary | ICD-10-CM | POA: Diagnosis not present

## 2012-04-12 DIAGNOSIS — D235 Other benign neoplasm of skin of trunk: Secondary | ICD-10-CM | POA: Diagnosis not present

## 2012-04-12 DIAGNOSIS — L570 Actinic keratosis: Secondary | ICD-10-CM | POA: Diagnosis not present

## 2012-04-12 DIAGNOSIS — Z85828 Personal history of other malignant neoplasm of skin: Secondary | ICD-10-CM | POA: Diagnosis not present

## 2012-04-17 ENCOUNTER — Ambulatory Visit (INDEPENDENT_AMBULATORY_CARE_PROVIDER_SITE_OTHER): Payer: Medicare Other | Admitting: Family

## 2012-04-17 DIAGNOSIS — I4891 Unspecified atrial fibrillation: Secondary | ICD-10-CM | POA: Diagnosis not present

## 2012-04-17 LAB — POCT INR: INR: 2.9

## 2012-04-17 NOTE — Patient Instructions (Addendum)
Continue same dose, 2.5 mg everyday, check in 6 weeks.    Latest dosing instructions   Total Sun Mon Tue Wed Thu Fri Sat   17.5 2.5 mg 2.5 mg 2.5 mg 2.5 mg 2.5 mg 2.5 mg 2.5 mg    (2.5 mg1) (2.5 mg1) (2.5 mg1) (2.5 mg1) (2.5 mg1) (2.5 mg1) (2.5 mg1)        

## 2012-04-24 DIAGNOSIS — H353290 Exudative age-related macular degeneration, unspecified eye, stage unspecified: Secondary | ICD-10-CM | POA: Diagnosis not present

## 2012-04-24 DIAGNOSIS — H35059 Retinal neovascularization, unspecified, unspecified eye: Secondary | ICD-10-CM | POA: Diagnosis not present

## 2012-04-24 DIAGNOSIS — H35359 Cystoid macular degeneration, unspecified eye: Secondary | ICD-10-CM | POA: Diagnosis not present

## 2012-05-29 ENCOUNTER — Ambulatory Visit (INDEPENDENT_AMBULATORY_CARE_PROVIDER_SITE_OTHER): Payer: Medicare Other | Admitting: Family

## 2012-05-29 DIAGNOSIS — I4891 Unspecified atrial fibrillation: Secondary | ICD-10-CM | POA: Diagnosis not present

## 2012-05-29 MED ORDER — COUMADIN 2.5 MG PO TABS: 2.5000 mg | ORAL_TABLET | Freq: Every day | ORAL | Status: DC

## 2012-05-29 NOTE — Patient Instructions (Addendum)
Continue same dose, 2.5 mg everyday, check in 6 weeks.    Latest dosing instructions   Total Sun Mon Tue Wed Thu Fri Sat   17.5 2.5 mg 2.5 mg 2.5 mg 2.5 mg 2.5 mg 2.5 mg 2.5 mg    (2.5 mg1) (2.5 mg1) (2.5 mg1) (2.5 mg1) (2.5 mg1) (2.5 mg1) (2.5 mg1)        

## 2012-06-05 DIAGNOSIS — H353290 Exudative age-related macular degeneration, unspecified eye, stage unspecified: Secondary | ICD-10-CM | POA: Diagnosis not present

## 2012-06-05 DIAGNOSIS — H35059 Retinal neovascularization, unspecified, unspecified eye: Secondary | ICD-10-CM | POA: Diagnosis not present

## 2012-06-26 DIAGNOSIS — H43819 Vitreous degeneration, unspecified eye: Secondary | ICD-10-CM | POA: Diagnosis not present

## 2012-06-26 DIAGNOSIS — Z961 Presence of intraocular lens: Secondary | ICD-10-CM | POA: Diagnosis not present

## 2012-06-26 DIAGNOSIS — H3530 Unspecified macular degeneration: Secondary | ICD-10-CM | POA: Diagnosis not present

## 2012-07-11 ENCOUNTER — Ambulatory Visit (INDEPENDENT_AMBULATORY_CARE_PROVIDER_SITE_OTHER): Payer: Medicare Other | Admitting: Family

## 2012-07-11 DIAGNOSIS — I4891 Unspecified atrial fibrillation: Secondary | ICD-10-CM | POA: Diagnosis not present

## 2012-07-11 NOTE — Patient Instructions (Signed)
Continue same dose, 2.5 mg everyday, check in 6 weeks.    Latest dosing instructions   Total Sun Mon Tue Wed Thu Fri Sat   17.5 2.5 mg 2.5 mg 2.5 mg 2.5 mg 2.5 mg 2.5 mg 2.5 mg    (2.5 mg1) (2.5 mg1) (2.5 mg1) (2.5 mg1) (2.5 mg1) (2.5 mg1) (2.5 mg1)

## 2012-07-20 DIAGNOSIS — H353290 Exudative age-related macular degeneration, unspecified eye, stage unspecified: Secondary | ICD-10-CM | POA: Diagnosis not present

## 2012-07-20 DIAGNOSIS — H35059 Retinal neovascularization, unspecified, unspecified eye: Secondary | ICD-10-CM | POA: Diagnosis not present

## 2012-07-28 ENCOUNTER — Other Ambulatory Visit: Payer: Self-pay | Admitting: Family

## 2012-08-17 DIAGNOSIS — L738 Other specified follicular disorders: Secondary | ICD-10-CM | POA: Diagnosis not present

## 2012-08-17 DIAGNOSIS — H6120 Impacted cerumen, unspecified ear: Secondary | ICD-10-CM | POA: Diagnosis not present

## 2012-08-21 ENCOUNTER — Encounter: Payer: Medicare Other | Admitting: Family

## 2012-08-24 ENCOUNTER — Other Ambulatory Visit: Payer: Self-pay | Admitting: Otolaryngology

## 2012-08-24 DIAGNOSIS — D485 Neoplasm of uncertain behavior of skin: Secondary | ICD-10-CM | POA: Diagnosis not present

## 2012-08-24 DIAGNOSIS — C44319 Basal cell carcinoma of skin of other parts of face: Secondary | ICD-10-CM | POA: Diagnosis not present

## 2012-08-28 DIAGNOSIS — H353290 Exudative age-related macular degeneration, unspecified eye, stage unspecified: Secondary | ICD-10-CM | POA: Diagnosis not present

## 2012-08-28 DIAGNOSIS — H35059 Retinal neovascularization, unspecified, unspecified eye: Secondary | ICD-10-CM | POA: Diagnosis not present

## 2012-08-29 ENCOUNTER — Ambulatory Visit (INDEPENDENT_AMBULATORY_CARE_PROVIDER_SITE_OTHER): Payer: Medicare Other | Admitting: Family

## 2012-08-29 ENCOUNTER — Encounter: Payer: Self-pay | Admitting: Family Medicine

## 2012-08-29 ENCOUNTER — Ambulatory Visit (INDEPENDENT_AMBULATORY_CARE_PROVIDER_SITE_OTHER): Payer: Medicare Other | Admitting: Family Medicine

## 2012-08-29 VITALS — BP 150/70 | Temp 97.6°F | Wt 184.0 lb

## 2012-08-29 DIAGNOSIS — Z23 Encounter for immunization: Secondary | ICD-10-CM

## 2012-08-29 DIAGNOSIS — E785 Hyperlipidemia, unspecified: Secondary | ICD-10-CM | POA: Diagnosis not present

## 2012-08-29 DIAGNOSIS — I4891 Unspecified atrial fibrillation: Secondary | ICD-10-CM

## 2012-08-29 DIAGNOSIS — I10 Essential (primary) hypertension: Secondary | ICD-10-CM | POA: Diagnosis not present

## 2012-08-29 DIAGNOSIS — Z0000 Encounter for general adult medical examination without abnormal findings: Secondary | ICD-10-CM | POA: Diagnosis not present

## 2012-08-29 DIAGNOSIS — H3530 Unspecified macular degeneration: Secondary | ICD-10-CM

## 2012-08-29 LAB — LIPID PANEL
Total CHOL/HDL Ratio: 4
Triglycerides: 69 mg/dL (ref 0.0–149.0)

## 2012-08-29 LAB — BASIC METABOLIC PANEL
CO2: 25 mEq/L (ref 19–32)
Calcium: 8.9 mg/dL (ref 8.4–10.5)
Creatinine, Ser: 1.1 mg/dL (ref 0.4–1.5)

## 2012-08-29 LAB — POCT INR: INR: 1.7

## 2012-08-29 NOTE — Patient Instructions (Addendum)
Continue with yearly flu vaccine 

## 2012-08-29 NOTE — Progress Notes (Signed)
Subjective:    Patient ID: Mark Wu, male    DOB: 10-30-1925, 78 y.o.   MRN: [id number]  HPI  Patient here for Medicare wellness exam and followup medical problems  .  He has history of hypertension, atrial fibrillation, chronic bilateral hearing loss, and macular degeneration.He remains on Coumadin for atrial fibrillation. No recent dizziness or chest pains. No bleeding complications. Getting Coumadin monitored regularly. Takes metoprolol for hypertension. Not monitoring blood pressures recently. No recent chest pains.  Patient estimates tetanus less than 10 years ago. Needs flu vaccine. He has hearing aids. He is followed regularly by retina specialist  Past Medical History  Diagnosis Date  . HYPERTENSION 06/11/2009  . Atrial fibrillation 06/11/2009  . Anal fissure 06/11/2009  . NECK MASS 10/01/2010   Past Surgical History  Procedure Date  . Cystoscopy 2000    hand    reports that he quit smoking about 21 years ago. His smoking use included Cigars. He does not have any smokeless tobacco history on file. His alcohol and drug histories not on file. family history is not on file. No Known Allergies  1.  Risk factors based on Past Medical , Social, and Family history reviewed and as indicated above 2.  Limitations in physical activities low risk for fall. 3.  Depression/mood no depression or anxiety issues 4.  Hearing chronic bilateral hearing loss with hearing aids 5.  ADLs independent in all 6.  Cognitive function (orientation to time and place, language, writing, speech,memory) no memory deficits. Judgment and language intact 7.  Home Safety no issues 8.  Height, weight, and visual acuity. Height and weight stable. Vision followed closely by ophthalmologist 9.  Counseling discussed regular exercise 10. Recommendation of preventive services. Discussed colonoscopy and is not interested at this time. Confirm date of last tetanus shot. Flu vaccine given 11. Labs based on risk  factors lipid panel and basic metabolic panel 12. Care Plan as above    Review of Systems  Constitutional: Negative for fever, activity change, appetite change, fatigue and unexpected weight change.  HENT: Negative for ear pain, congestion and trouble swallowing.   Eyes: Negative for pain and visual disturbance.  Respiratory: Negative for cough, shortness of breath and wheezing.   Cardiovascular: Negative for chest pain and palpitations.  Gastrointestinal: Negative for nausea, vomiting, abdominal pain, diarrhea, constipation, blood in stool, abdominal distention and rectal pain.  Genitourinary: Negative for dysuria, hematuria and testicular pain.  Musculoskeletal: Negative for joint swelling and arthralgias.  Skin: Negative for rash.  Neurological: Negative for dizziness, syncope and headaches.  Hematological: Negative for adenopathy.  Psychiatric/Behavioral: Negative for confusion and dysphoric mood.       Objective:   Physical Exam  Constitutional: He is oriented to person, place, and time. He appears well-developed and well-nourished. No distress.  HENT:  Head: Normocephalic and atraumatic.  Right Ear: External ear normal.  Left Ear: External ear normal.  Mouth/Throat: Oropharynx is clear and moist.  Eyes: Conjunctivae normal and EOM are normal. Pupils are equal, round, and reactive to light.  Neck: Normal range of motion. Neck supple. No thyromegaly present.  Cardiovascular: Normal rate, regular rhythm and normal heart sounds.   No murmur heard. Pulmonary/Chest: No respiratory distress. He has no wheezes. He has no rales.  Abdominal: Soft. Bowel sounds are normal. He exhibits no distension and no mass. There is no tenderness. There is no rebound and no guarding.  Musculoskeletal: He exhibits no edema.  Lymphadenopathy:    He has no cervical adenopathy.  Neurological: He is alert and oriented to person, place, and time. He displays normal reflexes. No cranial nerve deficit.    Skin: No rash noted.  Psychiatric: He has a normal mood and affect.         Assessment & Plan:   #1 health maintenance. Flu vaccine given. Discussed colonoscopy and not interested at this time. Confirm date of last tetanus. Patient will consider checking on coverage for shingles vaccine #2 history of atrial fibrillation. Appears to be in sinus rhythm today. Continue Coumadin through Coumadin clinic #3 hypertension. Mildly elevated reading today. Check closely at home. If consistently over 140/90 touch base next month or 2

## 2012-08-29 NOTE — Patient Instructions (Addendum)
Take an extra 1/2 tablet today only. Recheck in 4 weeks.     Latest dosing instructions   Total Sun Mon Tue Wed Thu Fri Sat   17.5 2.5 mg 2.5 mg 2.5 mg 2.5 mg 2.5 mg 2.5 mg 2.5 mg    (2.5 mg1) (2.5 mg1) (2.5 mg1) (2.5 mg1) (2.5 mg1) (2.5 mg1) (2.5 mg1)

## 2012-09-24 ENCOUNTER — Other Ambulatory Visit: Payer: Self-pay | Admitting: Family Medicine

## 2012-09-26 ENCOUNTER — Encounter: Payer: Medicare Other | Admitting: Family

## 2012-09-28 ENCOUNTER — Ambulatory Visit (INDEPENDENT_AMBULATORY_CARE_PROVIDER_SITE_OTHER): Payer: Medicare Other | Admitting: Family

## 2012-09-28 DIAGNOSIS — I4891 Unspecified atrial fibrillation: Secondary | ICD-10-CM

## 2012-09-28 NOTE — Patient Instructions (Signed)
Recheck in 4 weeks.     Latest dosing instructions   Total Sun Mon Tue Wed Thu Fri Sat   17.5 2.5 mg 2.5 mg 2.5 mg 2.5 mg 2.5 mg 2.5 mg 2.5 mg    (2.5 mg1) (2.5 mg1) (2.5 mg1) (2.5 mg1) (2.5 mg1) (2.5 mg1) (2.5 mg1)

## 2012-09-29 ENCOUNTER — Other Ambulatory Visit: Payer: Self-pay | Admitting: Family

## 2012-10-03 DIAGNOSIS — H353290 Exudative age-related macular degeneration, unspecified eye, stage unspecified: Secondary | ICD-10-CM | POA: Diagnosis not present

## 2012-10-03 DIAGNOSIS — H35059 Retinal neovascularization, unspecified, unspecified eye: Secondary | ICD-10-CM | POA: Diagnosis not present

## 2012-10-25 ENCOUNTER — Ambulatory Visit (INDEPENDENT_AMBULATORY_CARE_PROVIDER_SITE_OTHER): Payer: Medicare Other | Admitting: Family

## 2012-10-25 DIAGNOSIS — I4891 Unspecified atrial fibrillation: Secondary | ICD-10-CM | POA: Diagnosis not present

## 2012-10-25 NOTE — Patient Instructions (Addendum)
Recheck in 4 weeks.     Latest dosing instructions   Total Sun Mon Tue Wed Thu Fri Sat   17.5 2.5 mg 2.5 mg 2.5 mg 2.5 mg 2.5 mg 2.5 mg 2.5 mg    (2.5 mg1) (2.5 mg1) (2.5 mg1) (2.5 mg1) (2.5 mg1) (2.5 mg1) (2.5 mg1)       

## 2012-11-08 DIAGNOSIS — H353290 Exudative age-related macular degeneration, unspecified eye, stage unspecified: Secondary | ICD-10-CM | POA: Diagnosis not present

## 2012-11-08 DIAGNOSIS — H35059 Retinal neovascularization, unspecified, unspecified eye: Secondary | ICD-10-CM | POA: Diagnosis not present

## 2012-11-08 DIAGNOSIS — H35359 Cystoid macular degeneration, unspecified eye: Secondary | ICD-10-CM | POA: Diagnosis not present

## 2012-11-22 ENCOUNTER — Ambulatory Visit (INDEPENDENT_AMBULATORY_CARE_PROVIDER_SITE_OTHER): Payer: Medicare Other | Admitting: Family

## 2012-11-22 DIAGNOSIS — I4891 Unspecified atrial fibrillation: Secondary | ICD-10-CM

## 2012-11-22 LAB — POCT INR: INR: 2.4

## 2012-11-22 NOTE — Patient Instructions (Addendum)
Recheck in 6 weeks.     Latest dosing instructions   Total Sun Mon Tue Wed Thu Fri Sat   17.5 2.5 mg 2.5 mg 2.5 mg 2.5 mg 2.5 mg 2.5 mg 2.5 mg    (2.5 mg1) (2.5 mg1) (2.5 mg1) (2.5 mg1) (2.5 mg1) (2.5 mg1) (2.5 mg1)

## 2012-12-13 DIAGNOSIS — H353290 Exudative age-related macular degeneration, unspecified eye, stage unspecified: Secondary | ICD-10-CM | POA: Diagnosis not present

## 2012-12-13 DIAGNOSIS — H35359 Cystoid macular degeneration, unspecified eye: Secondary | ICD-10-CM | POA: Diagnosis not present

## 2012-12-13 DIAGNOSIS — H35059 Retinal neovascularization, unspecified, unspecified eye: Secondary | ICD-10-CM | POA: Diagnosis not present

## 2013-01-02 ENCOUNTER — Ambulatory Visit (INDEPENDENT_AMBULATORY_CARE_PROVIDER_SITE_OTHER): Payer: Medicare Other | Admitting: Family

## 2013-01-02 DIAGNOSIS — I4891 Unspecified atrial fibrillation: Secondary | ICD-10-CM | POA: Diagnosis not present

## 2013-01-02 MED ORDER — COUMADIN 2.5 MG PO TABS: 2.5000 mg | ORAL_TABLET | Freq: Every day | ORAL | Status: DC

## 2013-01-02 NOTE — Patient Instructions (Signed)
2.5mg  once a day. Recheck in 6 weeks.   Anticoagulation Dose Instructions as of 01/02/2013     Mark Wu Tue Wed Thu Fri Sat   New Dose 2.5 mg 2.5 mg 2.5 mg 2.5 mg 2.5 mg 2.5 mg 2.5 mg    Description       2.5mg  once a day. Recheck in 6 weeks.

## 2013-01-23 DIAGNOSIS — H353290 Exudative age-related macular degeneration, unspecified eye, stage unspecified: Secondary | ICD-10-CM | POA: Diagnosis not present

## 2013-01-23 DIAGNOSIS — H35059 Retinal neovascularization, unspecified, unspecified eye: Secondary | ICD-10-CM | POA: Diagnosis not present

## 2013-02-13 ENCOUNTER — Encounter: Payer: Medicare Other | Admitting: Family

## 2013-02-14 ENCOUNTER — Ambulatory Visit (INDEPENDENT_AMBULATORY_CARE_PROVIDER_SITE_OTHER): Payer: Medicare Other | Admitting: Family

## 2013-02-14 DIAGNOSIS — I4891 Unspecified atrial fibrillation: Secondary | ICD-10-CM

## 2013-02-14 NOTE — Patient Instructions (Addendum)
2.5mg  once a day. Recheck in 6 weeks.  Anticoagulation Dose Instructions as of 02/14/2013     Mark Wu Tue Wed Thu Fri Sat   New Dose 2.5 mg 2.5 mg 2.5 mg 2.5 mg 2.5 mg 2.5 mg 2.5 mg    Description       2.5mg  once a day. Recheck in 6 weeks.

## 2013-02-26 ENCOUNTER — Ambulatory Visit: Payer: Medicare Other | Admitting: Family Medicine

## 2013-03-01 DIAGNOSIS — H353290 Exudative age-related macular degeneration, unspecified eye, stage unspecified: Secondary | ICD-10-CM | POA: Diagnosis not present

## 2013-03-01 DIAGNOSIS — H35059 Retinal neovascularization, unspecified, unspecified eye: Secondary | ICD-10-CM | POA: Diagnosis not present

## 2013-03-06 ENCOUNTER — Encounter: Payer: Self-pay | Admitting: Family Medicine

## 2013-03-06 ENCOUNTER — Ambulatory Visit (INDEPENDENT_AMBULATORY_CARE_PROVIDER_SITE_OTHER): Payer: Medicare Other | Admitting: Family Medicine

## 2013-03-06 VITALS — BP 139/74 | Temp 97.5°F | Wt 186.0 lb

## 2013-03-06 DIAGNOSIS — I4891 Unspecified atrial fibrillation: Secondary | ICD-10-CM

## 2013-03-06 DIAGNOSIS — I10 Essential (primary) hypertension: Secondary | ICD-10-CM

## 2013-03-06 MED ORDER — METOPROLOL SUCCINATE ER 50 MG PO TB24: 50.0000 mg | ORAL_TABLET | Freq: Every day | ORAL | Status: DC

## 2013-03-06 NOTE — Progress Notes (Signed)
  Subjective:    Patient ID: Mark Wu, male    DOB: 10-31-1925, 79 y.o.   MRN: [id number]  HPI  Long history of atrial fibrillation For many years he has been on Coumadin and also takes metoprolol Denies any recent chest pains, dyspnea, dizziness, or palpitations. Protimes been stable. No recent bleeding complications.  He has chronic hearing loss and also macular degeneration followed closely by ophthalmology He is declined preventative issues such as shingles vaccine and colonoscopy. Stays quite active with farming  Past Medical History  Diagnosis Date  . HYPERTENSION 06/11/2009  . Atrial fibrillation 06/11/2009  . Anal fissure 06/11/2009  . NECK MASS 10/01/2010   Past Surgical History  Procedure Laterality Date  . Cystoscopy  2000    hand    reports that he quit smoking about 21 years ago. His smoking use included Cigars. He does not have any smokeless tobacco history on file. His alcohol and drug histories are not on file. family history is not on file. No Known Allergies   Review of Systems  Constitutional: Negative for fatigue and unexpected weight change.  Eyes: Negative for visual disturbance.  Respiratory: Negative for cough, chest tightness and shortness of breath.   Cardiovascular: Negative for chest pain, palpitations and leg swelling.  Neurological: Negative for dizziness, syncope, weakness, light-headedness and headaches.       Objective:   Physical Exam  Constitutional: He appears well-developed and well-nourished.  Cardiovascular: Normal rate and regular rhythm.   Pulmonary/Chest: Effort normal and breath sounds normal. No respiratory distress. He has no wheezes. He has no rales.  Musculoskeletal: He exhibits no edema.          Assessment & Plan:  #1 hypertension. Stable. Refill metoprolol for one year #2 history of atrial fibrillation. Clinically, appears to be in sinus rhythm today. Continue close followup with Coumadin clinic. Routine followup  6 months

## 2013-03-27 ENCOUNTER — Ambulatory Visit (INDEPENDENT_AMBULATORY_CARE_PROVIDER_SITE_OTHER): Payer: Medicare Other | Admitting: Family

## 2013-03-27 DIAGNOSIS — I4891 Unspecified atrial fibrillation: Secondary | ICD-10-CM

## 2013-03-27 LAB — POCT INR: INR: 2.7

## 2013-03-27 NOTE — Patient Instructions (Addendum)
2.5mg  once a day. Recheck in 6 weeks.  Anticoagulation Dose Instructions as of 03/27/2013     Glynis Smiles Tue Wed Thu Fri Sat   New Dose 2.5 mg 2.5 mg 2.5 mg 2.5 mg 2.5 mg 2.5 mg 2.5 mg    Description       2.5mg  once a day. Recheck in 6 weeks.

## 2013-04-04 DIAGNOSIS — L821 Other seborrheic keratosis: Secondary | ICD-10-CM | POA: Diagnosis not present

## 2013-04-04 DIAGNOSIS — D235 Other benign neoplasm of skin of trunk: Secondary | ICD-10-CM | POA: Diagnosis not present

## 2013-04-04 DIAGNOSIS — Z85828 Personal history of other malignant neoplasm of skin: Secondary | ICD-10-CM | POA: Diagnosis not present

## 2013-04-06 ENCOUNTER — Other Ambulatory Visit: Payer: Self-pay | Admitting: Family

## 2013-04-11 DIAGNOSIS — H353290 Exudative age-related macular degeneration, unspecified eye, stage unspecified: Secondary | ICD-10-CM | POA: Diagnosis not present

## 2013-04-11 DIAGNOSIS — H35059 Retinal neovascularization, unspecified, unspecified eye: Secondary | ICD-10-CM | POA: Diagnosis not present

## 2013-04-25 ENCOUNTER — Ambulatory Visit (INDEPENDENT_AMBULATORY_CARE_PROVIDER_SITE_OTHER): Payer: Medicare Other | Admitting: Family Medicine

## 2013-04-25 ENCOUNTER — Encounter: Payer: Self-pay | Admitting: Family Medicine

## 2013-04-25 VITALS — BP 140/68 | HR 60 | Temp 97.9°F | Ht 69.0 in | Wt 188.0 lb

## 2013-04-25 DIAGNOSIS — S3992XA Unspecified injury of lower back, initial encounter: Secondary | ICD-10-CM

## 2013-04-25 DIAGNOSIS — IMO0002 Reserved for concepts with insufficient information to code with codable children: Secondary | ICD-10-CM | POA: Diagnosis not present

## 2013-04-25 MED ORDER — CYCLOBENZAPRINE HCL 5 MG PO TABS: 5.0000 mg | ORAL_TABLET | Freq: Three times a day (TID) | ORAL | Status: DC | PRN

## 2013-04-25 NOTE — Progress Notes (Signed)
  Subjective:    Patient ID: Mark Wu, male    DOB: 1926-08-06, 79 y.o.   MRN: [id number]  HPI Acute visit for low back pain. Onset 2 days ago after bending over to dry off after getting out of shower. Pain is bilateral lower lumbar without radiation. Quality is dull ache. Severity is mild. Worse with position change but not at rest. No associated radiculopathy symptoms. No numbness or weakness. Taken some Tylenol with minimal relief. No heat or ice. No history of any chronic back problems. Avoids nonsteroidals because of chronic Coumadin.  Past Medical History  Diagnosis Date  . HYPERTENSION 06/11/2009  . Atrial fibrillation 06/11/2009  . Anal fissure 06/11/2009  . NECK MASS 10/01/2010   Past Surgical History  Procedure Laterality Date  . Cystoscopy  2000    hand    reports that he quit smoking about 21 years ago. His smoking use included Cigars. He does not have any smokeless tobacco history on file. His alcohol and drug histories are not on file. family history is not on file. No Known Allergies    Review of Systems  Constitutional: Negative for fever, activity change, appetite change and unexpected weight change.  Respiratory: Negative for cough and shortness of breath.   Cardiovascular: Negative for chest pain and leg swelling.  Gastrointestinal: Negative for vomiting and abdominal pain.  Genitourinary: Negative for dysuria, hematuria and flank pain.  Musculoskeletal: Positive for back pain. Negative for joint swelling.  Neurological: Negative for weakness and numbness.       Objective:   Physical Exam  Constitutional: He appears well-developed and well-nourished.  Cardiovascular: Normal rate.   Pulmonary/Chest: Effort normal and breath sounds normal. No respiratory distress. He has no wheezes. He has no rales.  Musculoskeletal:  Straight leg raises are negative No edema  Neurological:  Full-strength lower extremities. Trace knee and trace Achilles bilaterally.  Normal sensory function          Assessment & Plan:  Acute lumbar back strain. Recommend trial of heat or ice and topical rubs. Trial of Flexeril 5 mg each bedtime as needed. Continue Tylenol. Consider physical therapy if no better in 2-3 weeks

## 2013-04-25 NOTE — Patient Instructions (Addendum)

## 2013-05-09 ENCOUNTER — Ambulatory Visit (INDEPENDENT_AMBULATORY_CARE_PROVIDER_SITE_OTHER): Payer: Medicare Other | Admitting: General Practice

## 2013-05-09 ENCOUNTER — Ambulatory Visit: Payer: Medicare Other

## 2013-05-09 ENCOUNTER — Encounter: Payer: Medicare Other | Admitting: Family

## 2013-05-09 DIAGNOSIS — I4891 Unspecified atrial fibrillation: Secondary | ICD-10-CM

## 2013-05-09 DIAGNOSIS — Z7901 Long term (current) use of anticoagulants: Secondary | ICD-10-CM

## 2013-05-09 LAB — POCT INR: INR: 2.6

## 2013-05-17 DIAGNOSIS — H903 Sensorineural hearing loss, bilateral: Secondary | ICD-10-CM | POA: Diagnosis not present

## 2013-05-22 DIAGNOSIS — H35059 Retinal neovascularization, unspecified, unspecified eye: Secondary | ICD-10-CM | POA: Diagnosis not present

## 2013-05-22 DIAGNOSIS — H353290 Exudative age-related macular degeneration, unspecified eye, stage unspecified: Secondary | ICD-10-CM | POA: Diagnosis not present

## 2013-06-18 ENCOUNTER — Other Ambulatory Visit: Payer: Self-pay | Admitting: Family

## 2013-06-20 ENCOUNTER — Ambulatory Visit (INDEPENDENT_AMBULATORY_CARE_PROVIDER_SITE_OTHER): Payer: Medicare Other | Admitting: General Practice

## 2013-06-20 ENCOUNTER — Ambulatory Visit: Payer: Medicare Other

## 2013-06-20 DIAGNOSIS — I4891 Unspecified atrial fibrillation: Secondary | ICD-10-CM | POA: Diagnosis not present

## 2013-07-04 DIAGNOSIS — H353290 Exudative age-related macular degeneration, unspecified eye, stage unspecified: Secondary | ICD-10-CM | POA: Diagnosis not present

## 2013-07-04 DIAGNOSIS — H35059 Retinal neovascularization, unspecified, unspecified eye: Secondary | ICD-10-CM | POA: Diagnosis not present

## 2013-07-11 ENCOUNTER — Encounter: Payer: Self-pay | Admitting: Family Medicine

## 2013-07-11 ENCOUNTER — Ambulatory Visit (INDEPENDENT_AMBULATORY_CARE_PROVIDER_SITE_OTHER): Payer: Medicare Other | Admitting: Family Medicine

## 2013-07-11 VITALS — BP 140/70 | HR 58 | Temp 98.0°F | Wt 183.0 lb

## 2013-07-11 DIAGNOSIS — Z23 Encounter for immunization: Secondary | ICD-10-CM | POA: Diagnosis not present

## 2013-07-11 DIAGNOSIS — I10 Essential (primary) hypertension: Secondary | ICD-10-CM

## 2013-07-11 DIAGNOSIS — K5900 Constipation, unspecified: Secondary | ICD-10-CM

## 2013-07-11 LAB — TSH: TSH: 0.52 u[IU]/mL (ref 0.35–5.50)

## 2013-07-11 NOTE — Patient Instructions (Addendum)
Constipation, Adult Constipation is when a person has fewer than 3 bowel movements a week; has difficulty having a bowel movement; or has stools that are dry, hard, or larger than normal. As people grow older, constipation is more common. If you try to fix constipation with medicines that make you have a bowel movement (laxatives), the problem may get worse. Long-term laxative use may cause the muscles of the colon to become weak. A low-fiber diet, not taking in enough fluids, and taking certain medicines may make constipation worse. CAUSES   Certain medicines, such as antidepressants, pain medicine, iron supplements, antacids, and water pills.   Certain diseases, such as diabetes, irritable bowel syndrome (IBS), thyroid disease, or depression.   Not drinking enough water.   Not eating enough fiber-rich foods.   Stress or travel.  Lack of physical activity or exercise.  Not going to the restroom when there is the urge to have a bowel movement.  Ignoring the urge to have a bowel movement.  Using laxatives too much. SYMPTOMS   Having fewer than 3 bowel movements a week.   Straining to have a bowel movement.   Having hard, dry, or larger than normal stools.   Feeling full or bloated.   Pain in the lower abdomen.  Not feeling relief after having a bowel movement. DIAGNOSIS  Your caregiver will take a medical history and perform a physical exam. Further testing may be done for severe constipation. Some tests may include:   A barium enema X-ray to examine your rectum, colon, and sometimes, your small intestine.  A sigmoidoscopy to examine your lower colon.  A colonoscopy to examine your entire colon. TREATMENT  Treatment will depend on the severity of your constipation and what is causing it. Some dietary treatments include drinking more fluids and eating more fiber-rich foods. Lifestyle treatments may include regular exercise. If these diet and lifestyle recommendations  do not help, your caregiver may recommend taking over-the-counter laxative medicines to help you have bowel movements. Prescription medicines may be prescribed if over-the-counter medicines do not work.  HOME CARE INSTRUCTIONS   Increase dietary fiber in your diet, such as fruits, vegetables, whole grains, and beans. Limit high-fat and processed sugars in your diet, such as [place] fries, hamburgers, cookies, candies, and soda.   A fiber supplement may be added to your diet if you cannot get enough fiber from foods.   Drink enough fluids to keep your urine clear or pale yellow.   Exercise regularly or as directed by your caregiver.   Go to the restroom when you have the urge to go. Do not hold it.  Only take medicines as directed by your caregiver. Do not take other medicines for constipation without talking to your caregiver first. SEEK IMMEDIATE MEDICAL CARE IF:   You have bright red blood in your stool.   Your constipation lasts for more than 4 days or gets worse.   You have abdominal or rectal pain.   You have thin, pencil-like stools.  You have unexplained weight loss. MAKE SURE YOU:   Understand these instructions.  Will watch your condition.  Will get help right away if you are not doing well or get worse. Document Released: 07/01/2004 Document Revised: 12/26/2011 Document Reviewed: 09/06/2011 [facility] Patient Information 2014 [place], [place].  Drink more water Try to get back on stool softener. If no luck with that, try Miralax (but don't take regularly).

## 2013-07-11 NOTE — Progress Notes (Signed)
  Subjective:    Patient ID: Mark Wu, male    DOB: 1926-02-14, 79 y.o.   MRN: [id number]  HPI Patient seen with chief complaint of constipation Noted for approximately one month. He still goes fairly regular had to strain more. Has not noted any blood. He had remote history of colonoscopy this was way back in 1995. Has not noted a change in shape of stools. Has previously use stool softener which helps somewhat.  Denies any appetite or weight changes. No cold intolerance. Has never had thyroid assessed.  Past Medical History  Diagnosis Date  . HYPERTENSION 06/11/2009  . Atrial fibrillation 06/11/2009  . Anal fissure 06/11/2009  . NECK MASS 10/01/2010   Past Surgical History  Procedure Laterality Date  . Cystoscopy  2000    hand    reports that he quit smoking about 21 years ago. His smoking use included Cigars. He does not have any smokeless tobacco history on file. His alcohol and drug histories are not on file. family history is not on file. No Known Allergies    Review of Systems  Constitutional: Negative for appetite change and unexpected weight change.  Respiratory: Negative for shortness of breath.   Cardiovascular: Negative for chest pain.  Gastrointestinal: Positive for constipation. Negative for nausea, vomiting, abdominal pain and blood in stool.  Endocrine: Negative for cold intolerance.       Objective:   Physical Exam  Constitutional: He appears well-developed and well-nourished.  Cardiovascular: Normal rate.   Pulmonary/Chest: Effort normal and breath sounds normal. No respiratory distress. He has no wheezes. He has no rales.  Abdominal: Soft. Bowel sounds are normal. He exhibits no distension and no mass. There is no tenderness. There is no rebound and no guarding.          Assessment & Plan:  Constipation. Check TSH. Drink more water. 25-30 g of fiber per day. We discussed the importance of regular exercise such as walking. Try stool softener one  to 2 daily. If no luck with that, consider MiraLax. Touch base if none of the above working

## 2013-07-12 ENCOUNTER — Encounter: Payer: Self-pay | Admitting: Family Medicine

## 2013-08-01 ENCOUNTER — Ambulatory Visit (INDEPENDENT_AMBULATORY_CARE_PROVIDER_SITE_OTHER): Payer: Medicare Other | Admitting: General Practice

## 2013-08-01 DIAGNOSIS — I4891 Unspecified atrial fibrillation: Secondary | ICD-10-CM | POA: Diagnosis not present

## 2013-08-01 LAB — POCT INR: INR: 2.3

## 2013-08-15 DIAGNOSIS — H35369 Drusen (degenerative) of macula, unspecified eye: Secondary | ICD-10-CM | POA: Diagnosis not present

## 2013-08-15 DIAGNOSIS — H35359 Cystoid macular degeneration, unspecified eye: Secondary | ICD-10-CM | POA: Diagnosis not present

## 2013-08-15 DIAGNOSIS — H353190 Nonexudative age-related macular degeneration, unspecified eye, stage unspecified: Secondary | ICD-10-CM | POA: Diagnosis not present

## 2013-08-15 DIAGNOSIS — H353290 Exudative age-related macular degeneration, unspecified eye, stage unspecified: Secondary | ICD-10-CM | POA: Diagnosis not present

## 2013-08-26 ENCOUNTER — Ambulatory Visit (INDEPENDENT_AMBULATORY_CARE_PROVIDER_SITE_OTHER): Payer: Medicare Other | Admitting: Family Medicine

## 2013-08-26 ENCOUNTER — Encounter: Payer: Self-pay | Admitting: Family Medicine

## 2013-08-26 VITALS — BP 140/68 | HR 60 | Temp 97.4°F | Wt 183.0 lb

## 2013-08-26 DIAGNOSIS — N390 Urinary tract infection, site not specified: Secondary | ICD-10-CM | POA: Diagnosis not present

## 2013-08-26 LAB — POCT URINALYSIS DIPSTICK
Nitrite, UA: NEGATIVE
pH, UA: 5

## 2013-08-26 MED ORDER — CIPROFLOXACIN HCL 500 MG PO TABS: 500.0000 mg | ORAL_TABLET | Freq: Two times a day (BID) | ORAL | Status: DC

## 2013-08-26 NOTE — Progress Notes (Signed)
  Subjective:    Patient ID: Mark Wu, male    DOB: 09-21-1926, 79 y.o.   MRN: [id number]  HPI Acute visit Patient 2 days ago developed burning with urination and a little bit of gross hematuria. Possible low-grade fever Saturday but none since then. He also has some urine frequency. Does have prior history of kidney stones. He also takes Coumadin. He's not had any similar pain with prior kidney stones. No flank pain. No abdominal pain. Denies any nausea or vomiting. No known drug allergies.  Past Medical History  Diagnosis Date  . HYPERTENSION 06/11/2009  . Atrial fibrillation 06/11/2009  . Anal fissure 06/11/2009  . NECK MASS 10/01/2010   Past Surgical History  Procedure Laterality Date  . Cystoscopy  2000    hand    reports that he quit smoking about 22 years ago. His smoking use included Cigars. He does not have any smokeless tobacco history on file. His alcohol and drug histories are not on file. family history is not on file. No Known Allergies    Review of Systems  Constitutional: Positive for fatigue. Negative for chills.  Gastrointestinal: Negative for abdominal pain.  Genitourinary: Positive for dysuria, frequency and hematuria. Negative for flank pain.       Objective:   Physical Exam  Constitutional: He appears well-developed and well-nourished.  HENT:  Mouth/Throat: Oropharynx is clear and moist.  Cardiovascular: Normal rate.   Pulmonary/Chest: Effort normal and breath sounds normal. No respiratory distress. He has no wheezes. He has no rales.  Abdominal: Soft. There is no tenderness.          Assessment & Plan:  Probable UTI.  Urine cx sent.  Start Cipro 500 mg po bid for 7 days.  He already has coumadin check in 10 days and repeat UA then.

## 2013-08-26 NOTE — Patient Instructions (Signed)
Urinary Tract Infection  Urinary tract infections (UTIs) can develop anywhere along your urinary tract. Your urinary tract is your body's drainage system for removing wastes and extra water. Your urinary tract includes two kidneys, two ureters, a bladder, and a urethra. Your kidneys are a pair of bean-shaped organs. Each kidney is about the size of your fist. They are located below your ribs, one on each side of your spine.  CAUSES  Infections are caused by microbes, which are microscopic organisms, including fungi, viruses, and bacteria. These organisms are so small that they can only be seen through a microscope. Bacteria are the microbes that most commonly cause UTIs.  SYMPTOMS   Symptoms of UTIs may vary by age and gender of the patient and by the location of the infection. Symptoms in young women typically include a frequent and intense urge to urinate and a painful, burning feeling in the bladder or urethra during urination. Older women and men are more likely to be tired, shaky, and weak and have muscle aches and abdominal pain. A fever may mean the infection is in your kidneys. Other symptoms of a kidney infection include pain in your back or sides below the ribs, nausea, and vomiting.  DIAGNOSIS  To diagnose a UTI, your caregiver will ask you about your symptoms. Your caregiver also will ask to provide a urine sample. The urine sample will be tested for bacteria and white blood cells. White blood cells are made by your body to help fight infection.  TREATMENT   Typically, UTIs can be treated with medication. Because most UTIs are caused by a bacterial infection, they usually can be treated with the use of antibiotics. The choice of antibiotic and length of treatment depend on your symptoms and the type of bacteria causing your infection.  HOME CARE INSTRUCTIONS   If you were prescribed antibiotics, take them exactly as your caregiver instructs you. Finish the medication even if you feel better after you  have only taken some of the medication.   Drink enough water and fluids to keep your urine clear or pale yellow.   Avoid caffeine, tea, and carbonated beverages. They tend to irritate your bladder.   Empty your bladder often. Avoid holding urine for long periods of time.   Empty your bladder before and after sexual intercourse.   After a bowel movement, women should cleanse from front to back. Use each tissue only once.  SEEK MEDICAL CARE IF:    You have back pain.   You develop a fever.   Your symptoms do not begin to resolve within 3 days.  SEEK IMMEDIATE MEDICAL CARE IF:    You have severe back pain or lower abdominal pain.   You develop chills.   You have nausea or vomiting.   You have continued burning or discomfort with urination.  MAKE SURE YOU:    Understand these instructions.   Will watch your condition.   Will get help right away if you are not doing well or get worse.  Document Released: 07/13/2005 Document Revised: 04/03/2012 Document Reviewed: 11/11/2011  [employer] Patient Information 2014 [employer].

## 2013-08-29 ENCOUNTER — Other Ambulatory Visit: Payer: Self-pay | Admitting: Family

## 2013-08-29 LAB — URINE CULTURE: Culture: >=100000

## 2013-09-05 ENCOUNTER — Ambulatory Visit (INDEPENDENT_AMBULATORY_CARE_PROVIDER_SITE_OTHER): Payer: Medicare Other | Admitting: General Practice

## 2013-09-05 ENCOUNTER — Encounter: Payer: Self-pay | Admitting: Family Medicine

## 2013-09-05 ENCOUNTER — Ambulatory Visit (INDEPENDENT_AMBULATORY_CARE_PROVIDER_SITE_OTHER): Payer: Medicare Other | Admitting: Family Medicine

## 2013-09-05 VITALS — BP 138/70 | HR 73 | Temp 97.3°F | Wt 180.0 lb

## 2013-09-05 DIAGNOSIS — Z23 Encounter for immunization: Secondary | ICD-10-CM | POA: Diagnosis not present

## 2013-09-05 DIAGNOSIS — I4891 Unspecified atrial fibrillation: Secondary | ICD-10-CM | POA: Diagnosis not present

## 2013-09-05 DIAGNOSIS — K5900 Constipation, unspecified: Secondary | ICD-10-CM | POA: Diagnosis not present

## 2013-09-05 DIAGNOSIS — I10 Essential (primary) hypertension: Secondary | ICD-10-CM | POA: Diagnosis not present

## 2013-09-05 DIAGNOSIS — N390 Urinary tract infection, site not specified: Secondary | ICD-10-CM

## 2013-09-05 LAB — POCT URINALYSIS DIPSTICK
Blood, UA: NEGATIVE
Ketones, UA: NEGATIVE
Protein, UA: NEGATIVE
Spec Grav, UA: 1.015
Urobilinogen, UA: 0.2
pH, UA: 6.5

## 2013-09-05 NOTE — Progress Notes (Signed)
Pre visit review using our clinic review tool, if applicable. No additional management support is needed unless otherwise documented below in the visit note. 

## 2013-09-05 NOTE — Patient Instructions (Signed)

## 2013-09-05 NOTE — Progress Notes (Signed)
  Subjective:    Patient ID: Mark Wu, male    DOB: Oct 05, 1926, 79 y.o.   MRN: [id number]  HPI Patient seen for routine medical follow up He was seen just last week with acute issue of dysuria and urine culture grew out Escherichia coli. Treated with Cipro and is asymptomatic at this time.  He is having new problems with constipation. Generally good fluid intake. No abdominal pain. Last bowel movement 2 days ago. He tried Dulcolax with minimal improvement. He does not take any anti-cholinergic medications.  Atrial fibrillation history. Remains on Coumadin and metoprolol. No recent dizziness. No bleeding issues. No chest pains.  Patient has had previous pneumococcal polysaccharide vaccine but not Prevnar.  Past Medical History  Diagnosis Date  . HYPERTENSION 06/11/2009  . Atrial fibrillation 06/11/2009  . Anal fissure 06/11/2009  . NECK MASS 10/01/2010   Past Surgical History  Procedure Laterality Date  . Cystoscopy  2000    hand    reports that he quit smoking about 22 years ago. His smoking use included Cigars. He does not have any smokeless tobacco history on file. His alcohol and drug histories are not on file. family history is not on file. No Known Allergies    Review of Systems  Constitutional: Negative for fatigue.  Eyes: Negative for visual disturbance.  Respiratory: Negative for cough, chest tightness and shortness of breath.   Cardiovascular: Negative for chest pain, palpitations and leg swelling.  Gastrointestinal: Positive for constipation. Negative for abdominal pain.  Endocrine: Negative for polydipsia and polyuria.  Genitourinary: Negative for dysuria.  Neurological: Negative for dizziness, syncope, weakness, light-headedness and headaches.       Objective:   Physical Exam  Constitutional: He appears well-developed and well-nourished.  HENT:  Right Ear: External ear normal.  Left Ear: External ear normal.  Neck: Neck supple.  Cardiovascular: Normal  rate and regular rhythm.   Pulmonary/Chest: Effort normal and breath sounds normal. No respiratory distress. He has no wheezes. He has no rales.  Abdominal: Soft. There is no tenderness.  Musculoskeletal: He exhibits no edema.          Assessment & Plan:  #1 history of atrial fibrillation. Clinically, he is in sinus rhythm today. Continue metoprolol. INR 2.1. Continue monthly followup for that #2 recent UTI. Symptomatically improved after treatment with Cipro. Repeat urinalysis today #3 constipation. We discussed measures to reduce. Trial of MiraLax short-term #4 health maintenance. Prevnar given

## 2013-09-05 NOTE — Addendum Note (Signed)
Addended by: Thomasena Edis on: 09/05/2013 10:22 AM   Modules accepted: Orders

## 2013-09-05 NOTE — Progress Notes (Signed)
Pre-visit discussion using our clinic review tool. No additional management support is needed unless otherwise documented below in the visit note.  

## 2013-09-06 ENCOUNTER — Ambulatory Visit: Payer: Medicare Other | Admitting: Family Medicine

## 2013-10-03 DIAGNOSIS — H353290 Exudative age-related macular degeneration, unspecified eye, stage unspecified: Secondary | ICD-10-CM | POA: Diagnosis not present

## 2013-10-03 DIAGNOSIS — H35059 Retinal neovascularization, unspecified, unspecified eye: Secondary | ICD-10-CM | POA: Diagnosis not present

## 2013-10-21 ENCOUNTER — Ambulatory Visit: Payer: Medicare Other

## 2013-10-24 ENCOUNTER — Ambulatory Visit (INDEPENDENT_AMBULATORY_CARE_PROVIDER_SITE_OTHER): Payer: Medicare Other | Admitting: General Practice

## 2013-10-24 DIAGNOSIS — I4891 Unspecified atrial fibrillation: Secondary | ICD-10-CM | POA: Diagnosis not present

## 2013-10-24 LAB — POCT INR: INR: 2.3

## 2013-10-24 NOTE — Progress Notes (Signed)
Pre-visit discussion using our clinic review tool. No additional management support is needed unless otherwise documented below in the visit note.  

## 2013-11-09 ENCOUNTER — Other Ambulatory Visit: Payer: Self-pay | Admitting: Family Medicine

## 2013-11-11 ENCOUNTER — Other Ambulatory Visit: Payer: Self-pay | Admitting: General Practice

## 2013-11-11 MED ORDER — WARFARIN SODIUM 2.5 MG PO TABS: ORAL_TABLET | ORAL | Status: DC

## 2013-11-11 MED ORDER — COUMADIN 2.5 MG PO TABS: ORAL_TABLET | ORAL | Status: DC

## 2013-11-12 ENCOUNTER — Other Ambulatory Visit: Payer: Self-pay | Admitting: General Practice

## 2013-11-12 MED ORDER — WARFARIN SODIUM 2.5 MG PO TABS: ORAL_TABLET | ORAL | Status: DC

## 2013-11-21 DIAGNOSIS — H353290 Exudative age-related macular degeneration, unspecified eye, stage unspecified: Secondary | ICD-10-CM | POA: Diagnosis not present

## 2013-11-21 DIAGNOSIS — H35059 Retinal neovascularization, unspecified, unspecified eye: Secondary | ICD-10-CM | POA: Diagnosis not present

## 2013-12-05 ENCOUNTER — Ambulatory Visit: Payer: Medicare Other

## 2013-12-12 ENCOUNTER — Ambulatory Visit: Payer: Medicare Other

## 2013-12-16 ENCOUNTER — Ambulatory Visit (INDEPENDENT_AMBULATORY_CARE_PROVIDER_SITE_OTHER): Payer: Medicare Other | Admitting: Family

## 2013-12-16 DIAGNOSIS — I4891 Unspecified atrial fibrillation: Secondary | ICD-10-CM | POA: Diagnosis not present

## 2013-12-16 LAB — POCT INR: INR: 2.2

## 2013-12-16 NOTE — Patient Instructions (Signed)
2.5mg  once a day. Recheck in 6 weeks   Anticoagulation Dose Instructions as of 12/16/2013     Mark Wu Tue Wed Thu Fri Sat   New Dose 2.5 mg 2.5 mg 2.5 mg 2.5 mg 2.5 mg 2.5 mg 2.5 mg    Description       2.5mg  once a day. Recheck in 6 weeks.

## 2013-12-16 NOTE — Progress Notes (Signed)
Pre visit review using our clinic review tool, if applicable. No additional management support is needed unless otherwise documented below in the visit note. 

## 2014-01-07 ENCOUNTER — Other Ambulatory Visit: Payer: Self-pay | Admitting: General Practice

## 2014-01-07 MED ORDER — WARFARIN SODIUM 2.5 MG PO TABS: ORAL_TABLET | ORAL | Status: DC

## 2014-01-16 DIAGNOSIS — H353290 Exudative age-related macular degeneration, unspecified eye, stage unspecified: Secondary | ICD-10-CM | POA: Diagnosis not present

## 2014-01-16 DIAGNOSIS — H35059 Retinal neovascularization, unspecified, unspecified eye: Secondary | ICD-10-CM | POA: Diagnosis not present

## 2014-01-22 ENCOUNTER — Other Ambulatory Visit: Payer: Self-pay | Admitting: Family Medicine

## 2014-01-27 ENCOUNTER — Ambulatory Visit (INDEPENDENT_AMBULATORY_CARE_PROVIDER_SITE_OTHER): Payer: Medicare Other | Admitting: General Practice

## 2014-01-27 DIAGNOSIS — I4891 Unspecified atrial fibrillation: Secondary | ICD-10-CM | POA: Diagnosis not present

## 2014-01-27 LAB — POCT INR: INR: 2.1

## 2014-01-27 NOTE — Progress Notes (Signed)
Pre visit review using our clinic review tool, if applicable. No additional management support is needed unless otherwise documented below in the visit note. 

## 2014-03-10 ENCOUNTER — Ambulatory Visit: Payer: Medicare Other

## 2014-03-13 ENCOUNTER — Ambulatory Visit (INDEPENDENT_AMBULATORY_CARE_PROVIDER_SITE_OTHER): Payer: Medicare Other | Admitting: Family

## 2014-03-13 DIAGNOSIS — I4891 Unspecified atrial fibrillation: Secondary | ICD-10-CM

## 2014-03-13 LAB — POCT INR: INR: 2.7

## 2014-03-13 NOTE — Patient Instructions (Signed)
2.5mg  once a day. Recheck in 6 weeks.   Anticoagulation Dose Instructions as of 03/13/2014     Dorene Grebe Tue Wed Thu Fri Sat   New Dose 2.5 mg 2.5 mg 2.5 mg 2.5 mg 2.5 mg 2.5 mg 2.5 mg    Description       2.5mg  once a day. Recheck in 6 weeks.

## 2014-03-19 DIAGNOSIS — H35059 Retinal neovascularization, unspecified, unspecified eye: Secondary | ICD-10-CM | POA: Diagnosis not present

## 2014-03-19 DIAGNOSIS — H353290 Exudative age-related macular degeneration, unspecified eye, stage unspecified: Secondary | ICD-10-CM | POA: Diagnosis not present

## 2014-04-21 ENCOUNTER — Ambulatory Visit (INDEPENDENT_AMBULATORY_CARE_PROVIDER_SITE_OTHER): Payer: Medicare Other | Admitting: General Practice

## 2014-04-21 DIAGNOSIS — I4891 Unspecified atrial fibrillation: Secondary | ICD-10-CM

## 2014-04-21 LAB — POCT INR: INR: 2.5

## 2014-04-21 NOTE — Progress Notes (Signed)
Pre visit review using our clinic review tool, if applicable. No additional management support is needed unless otherwise documented below in the visit note. 

## 2014-04-22 DIAGNOSIS — H353190 Nonexudative age-related macular degeneration, unspecified eye, stage unspecified: Secondary | ICD-10-CM | POA: Diagnosis not present

## 2014-04-22 DIAGNOSIS — Z961 Presence of intraocular lens: Secondary | ICD-10-CM | POA: Diagnosis not present

## 2014-04-22 DIAGNOSIS — H353290 Exudative age-related macular degeneration, unspecified eye, stage unspecified: Secondary | ICD-10-CM | POA: Diagnosis not present

## 2014-05-01 ENCOUNTER — Encounter: Payer: Self-pay | Admitting: Family Medicine

## 2014-05-01 ENCOUNTER — Ambulatory Visit (INDEPENDENT_AMBULATORY_CARE_PROVIDER_SITE_OTHER): Payer: Medicare Other | Admitting: Family Medicine

## 2014-05-01 VITALS — BP 132/72 | HR 60 | Temp 97.6°F | Wt 186.0 lb

## 2014-05-01 DIAGNOSIS — T63443A Toxic effect of venom of bees, assault, initial encounter: Secondary | ICD-10-CM

## 2014-05-01 DIAGNOSIS — T6391XA Toxic effect of contact with unspecified venomous animal, accidental (unintentional), initial encounter: Secondary | ICD-10-CM

## 2014-05-01 DIAGNOSIS — T65893A Toxic effect of other specified substances, assault, initial encounter: Secondary | ICD-10-CM

## 2014-05-01 NOTE — Patient Instructions (Signed)

## 2014-05-01 NOTE — Progress Notes (Signed)
Pre visit review using our clinic review tool, if applicable. No additional management support is needed unless otherwise documented below in the visit note. 

## 2014-05-01 NOTE — Progress Notes (Signed)
   Subjective:    Patient ID: Mark Wu, male    DOB: 11/09/1925, 80 y.o.   MRN: [id number]  HPI Several bee stings yesterday. These were yellow jackets. Sustained to right shoulder, left abdomen, and left hand. No shortness of breath. No generalized hives. He took some Benadryl last night. These are pruritic and nonpainful. No fevers or chills.  Past Medical History  Diagnosis Date  . HYPERTENSION 06/11/2009  . Atrial fibrillation 06/11/2009  . Anal fissure 06/11/2009  . NECK MASS 10/01/2010   Past Surgical History  Procedure Laterality Date  . Cystoscopy  2000    hand    reports that he quit smoking about 22 years ago. His smoking use included Cigars. He does not have any smokeless tobacco history on file. His alcohol and drug histories are not on file. family history is not on file. No Known Allergies    Review of Systems  Constitutional: Negative for fever and chills.  Neurological: Negative for dizziness.       Objective:   Physical Exam  Constitutional: He appears well-developed and well-nourished.  HENT:  Mouth/Throat: Oropharynx is clear and moist.  Cardiovascular: Normal rate.   Pulmonary/Chest: Effort normal and breath sounds normal. No respiratory distress. He has no wheezes. He has no rales.  Skin:  Patient has some local erythema involving dorsum left hand, left lower abdomen and right shoulder. These correspond to sites of bee sting. No tenderness. Minimal warmth.          Assessment & Plan:  Bee sting with yellow jacket. Multiple sites as above with local allergic reaction. Continue Benadryl at night and consider Allegra or Claritin for day use.

## 2014-05-21 DIAGNOSIS — H353290 Exudative age-related macular degeneration, unspecified eye, stage unspecified: Secondary | ICD-10-CM | POA: Diagnosis not present

## 2014-05-21 DIAGNOSIS — H35059 Retinal neovascularization, unspecified, unspecified eye: Secondary | ICD-10-CM | POA: Diagnosis not present

## 2014-06-02 ENCOUNTER — Ambulatory Visit: Payer: Medicare Other

## 2014-06-06 ENCOUNTER — Other Ambulatory Visit: Payer: Self-pay | Admitting: Family Medicine

## 2014-06-09 ENCOUNTER — Telehealth: Payer: Self-pay | Admitting: Family

## 2014-06-09 ENCOUNTER — Ambulatory Visit (INDEPENDENT_AMBULATORY_CARE_PROVIDER_SITE_OTHER): Payer: Medicare Other | Admitting: Family

## 2014-06-09 DIAGNOSIS — I4891 Unspecified atrial fibrillation: Secondary | ICD-10-CM

## 2014-06-09 LAB — POCT INR: INR: 2.4

## 2014-06-09 NOTE — Patient Instructions (Signed)
2.5mg  once a day. Recheck in 6 weeks.   Anticoagulation Dose Instructions as of 06/09/2014     Mark Wu Tue Wed Thu Fri Sat   New Dose 2.5 mg 2.5 mg 2.5 mg 2.5 mg 2.5 mg 2.5 mg 2.5 mg    Description       2.5mg  once a day. Recheck in 6 weeks.

## 2014-06-09 NOTE — Telephone Encounter (Signed)
Left message on personally identified voicemail to advise pt of note and asked to call office to reschedule appt if it does not work for him.

## 2014-06-09 NOTE — Telephone Encounter (Signed)
2.5mg  once a day. Recheck in 6 weeks. I have scheduled for Oct 5, at 9:30. Please see if this is ok with him.

## 2014-07-01 DIAGNOSIS — H6120 Impacted cerumen, unspecified ear: Secondary | ICD-10-CM | POA: Diagnosis not present

## 2014-07-21 ENCOUNTER — Ambulatory Visit (INDEPENDENT_AMBULATORY_CARE_PROVIDER_SITE_OTHER): Payer: Medicare Other | Admitting: Family Medicine

## 2014-07-21 ENCOUNTER — Ambulatory Visit: Payer: Medicare Other | Admitting: Family

## 2014-07-21 ENCOUNTER — Encounter: Payer: Self-pay | Admitting: Family Medicine

## 2014-07-21 ENCOUNTER — Ambulatory Visit: Payer: Medicare Other

## 2014-07-21 VITALS — BP 154/80 | HR 58 | Temp 97.4°F | Wt 181.0 lb

## 2014-07-21 DIAGNOSIS — I482 Chronic atrial fibrillation, unspecified: Secondary | ICD-10-CM

## 2014-07-21 DIAGNOSIS — Z23 Encounter for immunization: Secondary | ICD-10-CM | POA: Diagnosis not present

## 2014-07-21 DIAGNOSIS — I10 Essential (primary) hypertension: Secondary | ICD-10-CM | POA: Diagnosis not present

## 2014-07-21 NOTE — Patient Instructions (Signed)
Monitor blood pressure and be in touch if consistently > 150/90 

## 2014-07-21 NOTE — Progress Notes (Signed)
Pre visit review using our clinic review tool, if applicable. No additional management support is needed unless otherwise documented below in the visit note. 

## 2014-07-21 NOTE — Progress Notes (Signed)
   Subjective:    Patient ID: Mark Wu, male    DOB: 07-09-1926, 80 y.o.   MRN: [id number]  HPI Here for routine medical follow up.  He has history of intermittent atrial fibrillation and hypertension. Has been on Coumadin and metoprolol for many years  Coumadin has generally been very stable. He is very compliant with therapy. Recently lost a brother to acute MI at age 51. Patient does not have chest pain. No dyspnea. He stays very active. No recent falls. Still needs flu vaccine.  Past Medical History  Diagnosis Date  . HYPERTENSION 06/11/2009  . Atrial fibrillation 06/11/2009  . Anal fissure 06/11/2009  . NECK MASS 10/01/2010   Past Surgical History  Procedure Laterality Date  . Cystoscopy  2000    hand    reports that he quit smoking about 22 years ago. His smoking use included Cigars. He does not have any smokeless tobacco history on file. His alcohol and drug histories are not on file. family history is not on file. No Known Allergies   Review of Systems  Constitutional: Negative for fatigue.  Eyes: Negative for visual disturbance.  Respiratory: Negative for cough, chest tightness and shortness of breath.   Cardiovascular: Negative for chest pain, palpitations and leg swelling.  Neurological: Negative for dizziness, syncope, weakness, light-headedness and headaches.       Objective:   Physical Exam  Constitutional: He appears well-developed and well-nourished.  Neck: Neck supple. No thyromegaly present.  Cardiovascular: Normal rate and regular rhythm.   Pulmonary/Chest: Effort normal and breath sounds normal. No respiratory distress. He has no wheezes. He has no rales.  Musculoskeletal: He exhibits no edema.          Assessment & Plan:  History of atrial fibrillation. Clinically, in sinus rhythm today. Continue Coumadin with close monitoring and low-dose metoprolol. Monitor blood pressure closely. Be in touch if consistently greater than 150/90  Hypertension.  Elevated blood pressure today 154/80 repeat left arm seated. Patient has not had similar readings recently. Check home blood pressures and if consistently over 150 be in touch.

## 2014-07-22 ENCOUNTER — Telehealth: Payer: Self-pay | Admitting: Family Medicine

## 2014-07-22 DIAGNOSIS — H3532 Exudative age-related macular degeneration: Secondary | ICD-10-CM | POA: Diagnosis not present

## 2014-07-22 DIAGNOSIS — H35352 Cystoid macular degeneration, left eye: Secondary | ICD-10-CM | POA: Diagnosis not present

## 2014-07-22 NOTE — Telephone Encounter (Signed)
emmi mailed  °

## 2014-07-23 ENCOUNTER — Telehealth: Payer: Self-pay | Admitting: Family Medicine

## 2014-07-23 MED ORDER — AMLODIPINE BESYLATE 5 MG PO TABS: 5.0000 mg | ORAL_TABLET | Freq: Every day | ORAL | Status: DC

## 2014-07-23 NOTE — Telephone Encounter (Signed)
Pt saw dr Elease Hashimoto Monday and states he was to let  know if his blood pressure did not get any better. Pt states it is still elevated. Last night it was 174/84.  This am 168/67. Do you need pt to come back in?  Pls advise.

## 2014-07-23 NOTE — Telephone Encounter (Signed)
Start amlodipine 5 mg once daily and office followup to reassess blood pressure in 3 weeks

## 2014-07-23 NOTE — Telephone Encounter (Signed)
Rx sent to pharmacy and pt is aware. Appointment is set.

## 2014-07-28 ENCOUNTER — Ambulatory Visit (INDEPENDENT_AMBULATORY_CARE_PROVIDER_SITE_OTHER): Payer: Medicare Other | Admitting: Family

## 2014-07-28 DIAGNOSIS — I482 Chronic atrial fibrillation, unspecified: Secondary | ICD-10-CM

## 2014-07-28 LAB — POCT INR: INR: 2.5

## 2014-07-28 NOTE — Patient Instructions (Signed)
2.5mg  once a day. Recheck in 6 weeks.   Anticoagulation Dose Instructions as of 07/28/2014     Dorene Grebe Tue Wed Thu Fri Sat   New Dose 2.5 mg 2.5 mg 2.5 mg 2.5 mg 2.5 mg 2.5 mg 2.5 mg    Description       2.5mg  once a day. Recheck in 6 weeks.

## 2014-08-13 ENCOUNTER — Encounter: Payer: Self-pay | Admitting: Family Medicine

## 2014-08-13 ENCOUNTER — Ambulatory Visit (INDEPENDENT_AMBULATORY_CARE_PROVIDER_SITE_OTHER): Payer: Medicare Other | Admitting: Family Medicine

## 2014-08-13 VITALS — BP 150/68 | HR 58 | Temp 97.2°F | Wt 185.0 lb

## 2014-08-13 DIAGNOSIS — I10 Essential (primary) hypertension: Secondary | ICD-10-CM | POA: Diagnosis not present

## 2014-08-13 MED ORDER — AMLODIPINE BESYLATE 5 MG PO TABS: 5.0000 mg | ORAL_TABLET | Freq: Every day | ORAL | Status: DC

## 2014-08-13 NOTE — Progress Notes (Signed)
   Subjective:    Patient ID: Mark Wu, male    DOB: 1926-04-15, 80 y.o.   MRN: [id number]  Hypertension Pertinent negatives include no chest pain, headaches, palpitations or shortness of breath.   Patient seen for follow-up hypertension. He has long-standing history of hypertension and atrial fibrillation for years been taking just metoprolol. Had a couple of recent blood pressure readings over 440 systolic and we added amlodipine 5 mg daily. He is tolerated without side effects. He is on chronic Coumadin for atrial fibrillation. Log of blood pressures from home reviewed. These are consistently below 347 systolic.  Past Medical History  Diagnosis Date  . HYPERTENSION 06/11/2009  . Atrial fibrillation 06/11/2009  . Anal fissure 06/11/2009  . NECK MASS 10/01/2010   Past Surgical History  Procedure Laterality Date  . Cystoscopy  2000    hand    reports that he quit smoking about 23 years ago. His smoking use included Cigars. He does not have any smokeless tobacco history on file. His alcohol and drug histories are not on file. family history is not on file. No Known Allergies    Review of Systems  Constitutional: Negative for fatigue.  Eyes: Negative for visual disturbance.  Respiratory: Negative for cough, chest tightness and shortness of breath.   Cardiovascular: Negative for chest pain, palpitations and leg swelling.  Neurological: Negative for dizziness, syncope, weakness, light-headedness and headaches.       Objective:   Physical Exam  Constitutional: He appears well-developed and well-nourished.  Cardiovascular: Normal rate.   Pulmonary/Chest: Effort normal and breath sounds normal. No respiratory distress. He has no wheezes. He has no rales.  Musculoskeletal: He exhibits no edema.          Assessment & Plan:  Hypertension-predominately isolated systolic improved on amlodipine. Refill for 1 year. Reassess blood pressure 4 months. Continue monthly checks for  Coumadin.

## 2014-08-13 NOTE — Progress Notes (Signed)
Pre visit review using our clinic review tool, if applicable. No additional management support is needed unless otherwise documented below in the visit note. 

## 2014-08-18 ENCOUNTER — Telehealth: Payer: Self-pay | Admitting: Family Medicine

## 2014-08-18 MED ORDER — COUMADIN 2.5 MG PO TABS: ORAL_TABLET | ORAL | Status: DC

## 2014-08-18 NOTE — Telephone Encounter (Signed)
[facility] [place] is requesting re-fill on COUMADIN 2.5 MG tablet

## 2014-08-18 NOTE — Telephone Encounter (Signed)
Done

## 2014-09-08 ENCOUNTER — Ambulatory Visit (INDEPENDENT_AMBULATORY_CARE_PROVIDER_SITE_OTHER): Payer: Medicare Other | Admitting: Family

## 2014-09-08 DIAGNOSIS — I482 Chronic atrial fibrillation, unspecified: Secondary | ICD-10-CM

## 2014-09-08 LAB — POCT INR: INR: 2.1

## 2014-09-08 NOTE — Patient Instructions (Signed)
2.5mg  once a day. Recheck in 6 weeks.   Anticoagulation Dose Instructions as of 09/08/2014      Dorene Grebe Tue Wed Thu Fri Sat   New Dose 2.5 mg 2.5 mg 2.5 mg 2.5 mg 2.5 mg 2.5 mg 2.5 mg    Description        2.5mg  once a day. Recheck in 6 weeks.

## 2014-09-12 ENCOUNTER — Other Ambulatory Visit: Payer: Self-pay | Admitting: Family Medicine

## 2014-09-16 DIAGNOSIS — H35052 Retinal neovascularization, unspecified, left eye: Secondary | ICD-10-CM | POA: Diagnosis not present

## 2014-09-16 DIAGNOSIS — H3532 Exudative age-related macular degeneration: Secondary | ICD-10-CM | POA: Diagnosis not present

## 2014-10-23 ENCOUNTER — Ambulatory Visit (INDEPENDENT_AMBULATORY_CARE_PROVIDER_SITE_OTHER): Payer: Medicare Other | Admitting: Family

## 2014-10-23 DIAGNOSIS — I482 Chronic atrial fibrillation, unspecified: Secondary | ICD-10-CM

## 2014-10-23 LAB — POCT INR: INR: 2.4

## 2014-10-23 NOTE — Patient Instructions (Signed)
2.5mg  once a day. Recheck in 6 weeks.   Anticoagulation Dose Instructions as of 10/23/2014      Mark Wu Tue Wed Thu Fri Sat   New Dose 2.5 mg 2.5 mg 2.5 mg 2.5 mg 2.5 mg 2.5 mg 2.5 mg    Description        2.5mg  once a day. Recheck in 6 weeks.

## 2014-10-28 DIAGNOSIS — H35052 Retinal neovascularization, unspecified, left eye: Secondary | ICD-10-CM | POA: Diagnosis not present

## 2014-10-28 DIAGNOSIS — H3532 Exudative age-related macular degeneration: Secondary | ICD-10-CM | POA: Diagnosis not present

## 2014-10-29 ENCOUNTER — Other Ambulatory Visit: Payer: Self-pay | Admitting: Family

## 2014-11-03 DIAGNOSIS — X32XXXD Exposure to sunlight, subsequent encounter: Secondary | ICD-10-CM | POA: Diagnosis not present

## 2014-11-03 DIAGNOSIS — Z1289 Encounter for screening for malignant neoplasm of other sites: Secondary | ICD-10-CM | POA: Diagnosis not present

## 2014-11-03 DIAGNOSIS — Z08 Encounter for follow-up examination after completed treatment for malignant neoplasm: Secondary | ICD-10-CM | POA: Diagnosis not present

## 2014-11-03 DIAGNOSIS — L570 Actinic keratosis: Secondary | ICD-10-CM | POA: Diagnosis not present

## 2014-11-03 DIAGNOSIS — Z85828 Personal history of other malignant neoplasm of skin: Secondary | ICD-10-CM | POA: Diagnosis not present

## 2014-12-04 ENCOUNTER — Ambulatory Visit (INDEPENDENT_AMBULATORY_CARE_PROVIDER_SITE_OTHER): Payer: Medicare Other | Admitting: Family Medicine

## 2014-12-04 ENCOUNTER — Encounter: Payer: Self-pay | Admitting: Family Medicine

## 2014-12-04 ENCOUNTER — Ambulatory Visit (INDEPENDENT_AMBULATORY_CARE_PROVIDER_SITE_OTHER): Payer: Medicare Other | Admitting: General Practice

## 2014-12-04 VITALS — BP 144/64 | HR 56 | Temp 97.3°F | Wt 182.0 lb

## 2014-12-04 DIAGNOSIS — I10 Essential (primary) hypertension: Secondary | ICD-10-CM

## 2014-12-04 DIAGNOSIS — I4891 Unspecified atrial fibrillation: Secondary | ICD-10-CM

## 2014-12-04 LAB — POCT INR: INR: 3.4

## 2014-12-04 MED ORDER — CLOTRIMAZOLE-BETAMETHASONE 1-0.05 % EX CREA: TOPICAL_CREAM | CUTANEOUS | Status: DC | PRN

## 2014-12-04 NOTE — Progress Notes (Signed)
Pre visit review using our clinic review tool, if applicable. No additional management support is needed unless otherwise documented below in the visit note. 

## 2014-12-04 NOTE — Progress Notes (Signed)
   Subjective:    Patient ID: Mark Wu, male    DOB: [birth year], 81 y.o.   MRN: [id number]  HPI Patient seen for follow-up hypertension. We added amlodipine back in the fall. He remains on metoprolol and Coumadin. Chronic atrial fibrillation. Blood pressures been controlled by home readings. Mostly 668D to 594 systolic. No orthostatic symptoms. No peripheral edema. No headaches. No recent bleeding complications.  Past Medical History  Diagnosis Date  . HYPERTENSION 06/11/2009  . Atrial fibrillation 06/11/2009  . Anal fissure 06/11/2009  . NECK MASS 10/01/2010   Past Surgical History  Procedure Laterality Date  . Cystoscopy  2000    hand    reports that he quit smoking about 23 years ago. His smoking use included Cigars. He does not have any smokeless tobacco history on file. His alcohol and drug histories are not on file. family history is not on file. No Known Allergies    Review of Systems  Constitutional: Negative for fatigue.  Eyes: Negative for visual disturbance.  Respiratory: Negative for cough, chest tightness and shortness of breath.   Cardiovascular: Negative for chest pain, palpitations and leg swelling.  Neurological: Negative for dizziness, syncope, weakness, light-headedness and headaches.       Objective:   Physical Exam  Constitutional: He is oriented to person, place, and time. He appears well-developed and well-nourished.  HENT:  Right Ear: External ear normal.  Left Ear: External ear normal.  Mouth/Throat: Oropharynx is clear and moist.  Eyes: Pupils are equal, round, and reactive to light.  Neck: Neck supple. No thyromegaly present.  Cardiovascular: Normal rate.   Pulmonary/Chest: Effort normal and breath sounds normal. No respiratory distress. He has no wheezes. He has no rales.  Musculoskeletal: He exhibits no edema.  Neurological: He is alert and oriented to person, place, and time.          Assessment & Plan:  Hypertension. Controlled and  at goal. Continue current medications. Routine follow-up 6 months. He will get INR checked today through the Coumadin clinic

## 2014-12-09 DIAGNOSIS — H35052 Retinal neovascularization, unspecified, left eye: Secondary | ICD-10-CM | POA: Diagnosis not present

## 2014-12-09 DIAGNOSIS — H3532 Exudative age-related macular degeneration: Secondary | ICD-10-CM | POA: Diagnosis not present

## 2014-12-31 DIAGNOSIS — H6120 Impacted cerumen, unspecified ear: Secondary | ICD-10-CM | POA: Diagnosis not present

## 2015-01-01 ENCOUNTER — Ambulatory Visit (INDEPENDENT_AMBULATORY_CARE_PROVIDER_SITE_OTHER): Payer: Medicare Other | Admitting: General Practice

## 2015-01-01 DIAGNOSIS — I4891 Unspecified atrial fibrillation: Secondary | ICD-10-CM | POA: Diagnosis not present

## 2015-01-01 LAB — POCT INR: INR: 3.3

## 2015-01-15 ENCOUNTER — Other Ambulatory Visit: Payer: Self-pay | Admitting: Family

## 2015-01-20 DIAGNOSIS — H3532 Exudative age-related macular degeneration: Secondary | ICD-10-CM | POA: Diagnosis not present

## 2015-01-26 ENCOUNTER — Ambulatory Visit (INDEPENDENT_AMBULATORY_CARE_PROVIDER_SITE_OTHER): Payer: Medicare Other | Admitting: General Practice

## 2015-01-26 DIAGNOSIS — I4891 Unspecified atrial fibrillation: Secondary | ICD-10-CM

## 2015-01-26 LAB — POCT INR: INR: 1.9

## 2015-01-26 NOTE — Progress Notes (Signed)
Pre visit review using our clinic review tool, if applicable. No additional management support is needed unless otherwise documented below in the visit note. 

## 2015-02-01 ENCOUNTER — Other Ambulatory Visit: Payer: Self-pay | Admitting: Family Medicine

## 2015-02-23 ENCOUNTER — Ambulatory Visit (INDEPENDENT_AMBULATORY_CARE_PROVIDER_SITE_OTHER): Payer: Medicare Other | Admitting: General Practice

## 2015-02-23 DIAGNOSIS — I4891 Unspecified atrial fibrillation: Secondary | ICD-10-CM | POA: Diagnosis not present

## 2015-02-23 DIAGNOSIS — Z5181 Encounter for therapeutic drug level monitoring: Secondary | ICD-10-CM | POA: Insufficient documentation

## 2015-02-23 LAB — POCT INR: INR: 2.5

## 2015-02-23 NOTE — Progress Notes (Signed)
Pre visit review using our clinic review tool, if applicable. No additional management support is needed unless otherwise documented below in the visit note. 

## 2015-03-10 DIAGNOSIS — H3532 Exudative age-related macular degeneration: Secondary | ICD-10-CM | POA: Diagnosis not present

## 2015-03-30 ENCOUNTER — Ambulatory Visit (INDEPENDENT_AMBULATORY_CARE_PROVIDER_SITE_OTHER): Payer: Medicare Other | Admitting: General Practice

## 2015-03-30 ENCOUNTER — Ambulatory Visit: Payer: Medicare Other | Admitting: General Practice

## 2015-03-30 DIAGNOSIS — Z5181 Encounter for therapeutic drug level monitoring: Secondary | ICD-10-CM

## 2015-03-30 DIAGNOSIS — I4891 Unspecified atrial fibrillation: Secondary | ICD-10-CM | POA: Diagnosis not present

## 2015-03-30 LAB — POCT INR: INR: 2.4

## 2015-03-30 NOTE — Progress Notes (Signed)
Pre visit review using our clinic review tool, if applicable. No additional management support is needed unless otherwise documented below in the visit note. 

## 2015-04-28 DIAGNOSIS — H3532 Exudative age-related macular degeneration: Secondary | ICD-10-CM | POA: Diagnosis not present

## 2015-05-04 ENCOUNTER — Ambulatory Visit (INDEPENDENT_AMBULATORY_CARE_PROVIDER_SITE_OTHER): Payer: Medicare Other | Admitting: General Practice

## 2015-05-04 DIAGNOSIS — Z5181 Encounter for therapeutic drug level monitoring: Secondary | ICD-10-CM

## 2015-05-04 DIAGNOSIS — I4891 Unspecified atrial fibrillation: Secondary | ICD-10-CM | POA: Diagnosis not present

## 2015-05-04 LAB — POCT INR: INR: 2.5

## 2015-05-04 NOTE — Progress Notes (Signed)
Pre visit review using our clinic review tool, if applicable. No additional management support is needed unless otherwise documented below in the visit note. 

## 2015-06-02 ENCOUNTER — Ambulatory Visit (INDEPENDENT_AMBULATORY_CARE_PROVIDER_SITE_OTHER): Payer: Medicare Other | Admitting: Family Medicine

## 2015-06-02 ENCOUNTER — Encounter: Payer: Self-pay | Admitting: Family Medicine

## 2015-06-02 VITALS — BP 136/72 | HR 60 | Temp 97.3°F | Wt 178.0 lb

## 2015-06-02 DIAGNOSIS — I10 Essential (primary) hypertension: Secondary | ICD-10-CM | POA: Diagnosis not present

## 2015-06-02 DIAGNOSIS — I4891 Unspecified atrial fibrillation: Secondary | ICD-10-CM

## 2015-06-02 DIAGNOSIS — S46111A Strain of muscle, fascia and tendon of long head of biceps, right arm, initial encounter: Secondary | ICD-10-CM | POA: Diagnosis not present

## 2015-06-02 DIAGNOSIS — S46211A Strain of muscle, fascia and tendon of other parts of biceps, right arm, initial encounter: Secondary | ICD-10-CM

## 2015-06-02 NOTE — Progress Notes (Signed)
Pre visit review using our clinic review tool, if applicable. No additional management support is needed unless otherwise documented below in the visit note. 

## 2015-06-02 NOTE — Patient Instructions (Signed)
Biceps Tendon Disruption (Proximal) with Rehab The biceps tendon attaches the biceps muscle to the bones of the elbow and the shoulder. A proximal biceps tendon disruption is a tear of the long head of the tendon that attaches near the shoulder (more common) or a tear in the muscle near the muscle tendon junction (less common). These injuries usually involve a complete tear of the tendon from the bone. However, partial tears are also possible. The biceps muscle works with other muscles to bend the elbow and rotate the palm upward (supinate). A complete biceps rupture will result in about a 10% decrease in elbow bending strength and a 20% decrease in your ability to turn the palm upward, using the wrist. SYMPTOMS   Pain, tenderness, swelling, warmth, or redness over the front of the shoulder.  Pain that gets worse with shoulder and elbow use, especially against resistance (lifting or carrying).  Bruising (contusion) in the arm or elbow after 24 to 48 hours.  Bulge can be seen and felt in the arm.  Limited motion of the shoulder or elbow.  Weakness with attempted elbow bending or rotation of the wrist, such as when using a screwdriver.  Crackling sound (crepitation) when the tendon or shoulder is moved or touched. CAUSES  A biceps tendon rupture occurs when the tendon is subjected to a force that is greater than it can withstand. For example, a sudden force straightening the elbow while the biceps is flexed, or a direct hit (trauma) (rare). RISK INCREASES WITH:   Sports that involve contact, or throwing and overhead activities (racquet sports, gymnastics, weightlifting, bodybuilding).  Heavy labor.  Poor strength and flexibility.  Failure to warm up properly before activity. PREVENTION  Warm up and stretch properly before activity.  Maintain physical fitness:  Strength, flexibility, and endurance.  Cardiovascular fitness.  Allow your body to recover between practices and  competition.  Learn and use proper exercise technique. PROGNOSIS  If treated properly, the symptoms of a proximal biceps tendon disruption usually go away within 12 weeks of injury.  RELATED COMPLICATIONS  Longer healing time if not properly treated, or if not given enough time to heal.  Recurring symptoms, especially if activity is resumed too soon.  Weakness of elbow bending and forearm rotation.  Prolonged disability (uncommon). TREATMENT Treatment first involves the use of ice and medicine, to reduce pain and inflammation. It is also important to perform strengthening and stretching exercises and to modify activities that cause symptoms to get worse. These exercises may be performed at home or with a therapist. It is not possible to surgically fix the tendon (sew it together). However, surgery may be performed to reinsert the tendon into the arm bone. This will make the arm look normal again. Surgery is most often advised for younger, active individuals, especially those who require strength of wrist rotation.  MEDICATION  If pain medicine is needed, nonsteroidal anti-inflammatory medicines (aspirin and ibuprofen), or other minor pain relievers (acetaminophen), are often advised.  Do not take pain medicine for 7 days before surgery.  Prescription pain relievers may be given if your caregiver thinks they are needed. Use only as directed and only as much as you need.  Corticosteroid injections may be given to help reduce inflammation, but are not usually recommended for this injury. HEAT AND COLD  Cold treatment (icing) should be applied for 10 to 15 minutes every 2 to 3 hours for inflammation and pain, and immediately after activity that aggravates your symptoms. Use ice packs  or an ice massage.  Heat treatment may be used before performing stretching and strengthening activities prescribed by your caregiver, physical therapist, or athletic trainer. Use a heat pack or a warm water  soak. SEEK MEDICAL CARE IF:   Symptoms get worse or do not improve in 2 weeks, despite treatment.  New, unexplained symptoms develop. (Drugs used in treatment may produce side effects.) EXERCISES RANGE OF MOTION (ROM) AND STRETCHING EXERCISES - Biceps Tendon Disruption (Proximal) These exercises may help you when beginning to rehabilitate your injury. Your symptoms may go away with or without further involvement from your physician, physical therapist or athletic trainer. While completing these exercises, remember:   Restoring tissue flexibility helps normal motion to return to the joints. This allows healthier, less painful movement and activity.  An effective stretch should be held for at least 30 seconds.  A stretch should never be painful. You should only feel a gentle lengthening or release in the stretched tissue. STRETCH - Flexion, Standing  Stand with good posture. With an underhand grip on your right / left hand and an overhand grip on the opposite hand, grasp a broomstick or cane so that your hands are a little more than shoulder width apart.  Keeping your right / left elbow straight and shoulder muscles relaxed, push the stick with your opposite hand to raise your right / left arm in front of your body and then overhead. Raise your arm until you feel a stretch in your right / left shoulder, but before you have increased shoulder pain.  Try to avoid shrugging your right / left shoulder as your arm rises by keeping your shoulder blade tucked down and toward your mid-back spine. Hold __________ seconds.  Slowly return to the starting position. Repeat __________ times. Complete this exercise __________ times per day. STRETCH - Abduction, Supine  Stand with good posture. With an underhand grip on your right / left hand and an overhand grip on the opposite hand, grasp a broomstick or cane so that your hands are a little more than shoulder-width apart.  Keeping your right / left  elbow straight and shoulder muscles relaxed, push the stick with your opposite hand to raise your right / left arm out to the side of your body and then overhead. Raise your arm until you feel a stretch in your right / left shoulder, but before you have increased shoulder pain.  Try to avoid shrugging your right / left shoulder as your arm rises, by keeping your shoulder blade tucked down and toward your mid-back spine. Hold for __________ seconds.  Slowly return to the starting position. Repeat __________ times. Complete this exercise __________ times per day. ROM - Flexion, Active-Assisted  Lie on your back. You may bend your knees for comfort.  Grasp a broomstick or cane so your hands are about shoulder width apart. Your right / left hand should grip the end of the stick so that your hand is positioned "thumbs-up," as if you were about to shake hands.  Using your healthy arm to lead, raise your right / left arm overhead until you feel a gentle stretch in your shoulder. Hold for right / left seconds.  Use the stick to assist in returning your right / left arm to its starting position. Repeat __________ times. Complete this exercise __________ times per day.  STRETCH - Flexion, Standing   Stand facing a wall. Walk your right / left fingers up the wall until you feel a moderate stretch in your  shoulder. As your hand gets higher, you may need to step closer to the wall or use a door frame to walk through.  Try to avoid shrugging your right / left shoulder as your arm rises, by keeping your shoulder blade tucked down and toward your mid-back spine.  Hold for __________ seconds. Use your other hand, if needed, to ease out of the stretch and return to the starting position. Repeat __________ times. Complete this exercise __________ times per day.  ROM - Internal Rotation   Using underhand grips, grasp a stick behind your back with both hands.  While standing upright with good posture, slide  the stick up your back until you feel a mild stretch in the front of your shoulder.  Hold for __________ seconds. Slowly return to your starting position. Repeat __________ times. Complete this exercise __________ times per day.  STRETCH - Internal Rotation  Place your right / left hand behind your back, palm-up.  Throw a towel or belt over your opposite shoulder. Grasp the towel with your right / left hand.  While keeping an upright posture, gently pull up on the towel until you feel a stretch in the front of your right / left shoulder.  Avoid shrugging your right / left shoulder as your arm rises, by keeping your shoulder blade tucked down and toward your mid-back spine.  Hold for __________ seconds. Release the stretch by lowering your opposite hand. Repeat __________ times. Complete this exercise __________ times per day. STRETCH - Elbow Flexors   Lie on a firm bed or countertop on your back. Be sure that you are in a comfortable position which will allow you to relax your arm muscles.  Place a folded towel under your right / left upper arm, so that your elbow and shoulder are at the same height. Extend your arm; your elbow should not rest on the bed or towel.  Allow the weight of your hand to straighten your elbow. Keep your arm and chest muscles relaxed. Your caregiver may ask you to increase the intensity of your stretch by adding a small wrist or hand weight.  Hold for __________ seconds. You should feel a stretch on the inside of your elbow. Slowly return to the starting position. Repeat __________ times. Complete this exercise __________ times per day. STRENGTHENING EXERCISES - Biceps Tendon Disruption (Proximal) These exercises may help you regain your strength after your physician has discontinued your restraint in a cast or brace. They may resolve your symptoms with or without further involvement from your physician, physical therapist or athletic trainer. While completing  these exercises, remember:   Muscles can gain both the endurance and the strength needed for everyday activities through controlled exercises.  Complete these exercises as instructed by your physician, physical therapist or athletic trainer. Progress the resistance and repetitions only as guided.  You may experience muscle soreness or fatigue, but the pain or discomfort you are trying to eliminate should never worsen during these exercises. If this pain does get worse, stop and make sure you are following the directions exactly. If the pain is still present after adjustments, discontinue the exercise until you can discuss the trouble with your clinician. STRENGTH - Elbow Flexors, Isometric   Stand or sit upright on a firm surface. Place your right / left arm so that your hand is palm-up and at the height of your waist.  Place your opposite hand on top of your forearm. Gently push down as your right / left arm  resists. Push as hard as you can with both arms, without causing any pain or movement at your right / left elbow. Hold this stationary position for __________ seconds.  Gradually release the tension in both arms. Allow your muscles to relax completely before repeating. Repeat __________ times. Complete this exercise __________ times per day. STRENGTH - Shoulder Flexion, Isometric  With good posture and facing a wall, stand or sit about 4-6 inches away.  Keeping your right / left elbow straight, gently press the top of your fist into the wall. Increase the pressure gradually until you are pressing as hard as you can without shrugging your shoulder or increasing any shoulder discomfort.  Hold for __________ seconds.  Release the tension slowly. Relax your shoulder muscles completely before you the next repetition. Repeat __________ times. Complete this exercise __________ times per day.  STRENGTH - Elbow Flexors, Supinated  With good posture, stand or sit on a firm chair without  armrests. Allow your right / left arm to rest at your side with your palm facing forward.  Holding a __________weight or gripping a rubber exercise band or tubing, bring your hand toward your shoulder.  Allow your muscles to control the resistance as your hand returns to your side. Repeat __________ times. Complete this exercise __________ times per day.  STRENGTH - Elbow Flexors, Neutral  With good posture, stand or sit on a firm chair without armrests. Allow your right / left arm to rest at your side with your thumb facing forward.  Holding a __________ weight or gripping a rubber exercise band or tubing, bring your hand toward your shoulder.  Allow your muscles to control the resistance as your hand returns to your side. Repeat __________ times. Complete this exercise __________ times per day.  STRENGTH - Shoulder Flexion   Stand or sit with good posture. Grasp a __________ weight, or an exercise band or tubing, so that your hand is "thumbs-up," like when you shake hands.  Slowly lift your right / left arm as far as you can without increasing any shoulder pain. Initially, many people can only raise their hand to shoulder height.  Avoid shrugging your right / left shoulder as your arm rises, by keeping your shoulder blade tucked down and toward your mid-back spine.  Hold for __________ seconds. Control the descent of your hand as you slowly return to your starting position. Repeat __________ times. Complete this exercise __________ times per day.  STRENGTH - Forearm Supinators   Sit with your right / left forearm supported on a table, keeping your elbow below shoulder height. Rest your hand over the edge, palm down.  Gently grip a hammer or a soup ladle.  Without moving your elbow, slowly turn your palm and hand upward to a "thumbs-up" position.  Hold this position for __________ seconds. Slowly return to the starting position. Repeat __________ times. Complete this exercise  __________ times per day.  STRENGTH - Forearm Pronators   Sit with your right / left forearm supported on a table, keeping your elbow below shoulder height. Rest your hand over the edge, palm up.  Gently grip a hammer or a soup ladle.  Without moving your elbow, slowly turn your palm and hand upward to a "thumbs-up" position.  Hold this position for __________ seconds. Slowly return to the starting position. Repeat __________ times. Complete this exercise __________ times per day.  Document Released: 10/03/2005 Document Revised: 12/26/2011 Document Reviewed: 01/15/2009 [facility] Patient Information 2015 [place], [place]. This information is not  intended to replace advice given to you by your health care provider. Make sure you discuss any questions you have with your health care provider.

## 2015-06-02 NOTE — Progress Notes (Signed)
   Subjective:    Patient ID: Mark Wu, male    DOB: [birth year], 81 y.o.   MRN: [id number]  HPI Patient here for follow-up regarding:  Hypertension. Treated with metoprolol and amlodipine. Blood pressure stable. No dizziness. No headaches. No chest pains. Compliant with therapy.  Atrial fibrillation. Diagnosed many years ago. He takes Coumadin and gets INR monitored regularly. No bleeding complications of any recent bruise right biceps.  2 days ago noticed some swelling of his right biceps and yesterday noticed some bruising. He does not recall specific injury but did have some recent pains upper shoulder region after doing some relatively light lifting. He does not recall hearing any kind of "pop" or tear  Past Medical History  Diagnosis Date  . HYPERTENSION 06/11/2009  . Atrial fibrillation 06/11/2009  . Anal fissure 06/11/2009  . NECK MASS 10/01/2010   Past Surgical History  Procedure Laterality Date  . Cystoscopy  2000    hand    reports that he quit smoking about 23 years ago. His smoking use included Cigars. He does not have any smokeless tobacco history on file. His alcohol and drug histories are not on file. family history is not on file. No Known Allergies     Review of Systems  Constitutional: Negative for fatigue.  Eyes: Negative for visual disturbance.  Respiratory: Negative for cough, chest tightness and shortness of breath.   Cardiovascular: Negative for chest pain, palpitations and leg swelling.  Neurological: Negative for dizziness, syncope, weakness, light-headedness and headaches.       Objective:   Physical Exam  Constitutional: He is oriented to person, place, and time. He appears well-developed and well-nourished.  HENT:  Right Ear: External ear normal.  Left Ear: External ear normal.  Mouth/Throat: Oropharynx is clear and moist.  Eyes: Pupils are equal, round, and reactive to light.  Neck: Neck supple. No thyromegaly present.  Cardiovascular:  Normal rate.   Occasional irregular beat. Rate is around 60  Pulmonary/Chest: Effort normal and breath sounds normal. No respiratory distress. He has no wheezes. He has no rales.  Musculoskeletal: He exhibits no edema.  He has some swelling of the right biceps region distally (suspect shortening of muscle) with some diffuse ecchymosis. Nontender. He has fair strength with flexion of the right forearm  Neurological: He is alert and oriented to person, place, and time.          Assessment & Plan:    #1 hypertension stable and at goal. Continue current medications #2 long-standing history of atrial fibrillation. rate controlled. Continue Coumadin #3 right biceps swelling. Suspect that he has ruptured proximal biceps tendon. Other head is intact. He does not have any associated pain. We explained that functionally he will probably not be limited and at his age would not advise surgical evaluation. We will also reassess in 2-3 weeks and can discuss at that point whether to have him have any further intervention for diagnostics

## 2015-06-15 ENCOUNTER — Ambulatory Visit (INDEPENDENT_AMBULATORY_CARE_PROVIDER_SITE_OTHER): Payer: Medicare Other | Admitting: Family Medicine

## 2015-06-15 ENCOUNTER — Encounter: Payer: Self-pay | Admitting: Family Medicine

## 2015-06-15 ENCOUNTER — Ambulatory Visit (INDEPENDENT_AMBULATORY_CARE_PROVIDER_SITE_OTHER): Payer: Medicare Other | Admitting: General Practice

## 2015-06-15 VITALS — BP 132/70 | HR 60 | Temp 97.9°F | Wt 178.0 lb

## 2015-06-15 DIAGNOSIS — Z5181 Encounter for therapeutic drug level monitoring: Secondary | ICD-10-CM

## 2015-06-15 DIAGNOSIS — S46211D Strain of muscle, fascia and tendon of other parts of biceps, right arm, subsequent encounter: Secondary | ICD-10-CM

## 2015-06-15 DIAGNOSIS — I10 Essential (primary) hypertension: Secondary | ICD-10-CM

## 2015-06-15 DIAGNOSIS — S46111D Strain of muscle, fascia and tendon of long head of biceps, right arm, subsequent encounter: Secondary | ICD-10-CM

## 2015-06-15 DIAGNOSIS — I4891 Unspecified atrial fibrillation: Secondary | ICD-10-CM | POA: Diagnosis not present

## 2015-06-15 LAB — POCT INR: INR: 2.8

## 2015-06-15 NOTE — Patient Instructions (Signed)
Biceps Tendon Disruption (Proximal) with Rehab The biceps tendon attaches the biceps muscle to the bones of the elbow and the shoulder. A proximal biceps tendon disruption is a tear of the long head of the tendon that attaches near the shoulder (more common) or a tear in the muscle near the muscle tendon junction (less common). These injuries usually involve a complete tear of the tendon from the bone. However, partial tears are also possible. The biceps muscle works with other muscles to bend the elbow and rotate the palm upward (supinate). A complete biceps rupture will result in about a 10% decrease in elbow bending strength and a 20% decrease in your ability to turn the palm upward, using the wrist. SYMPTOMS   Pain, tenderness, swelling, warmth, or redness over the front of the shoulder.  Pain that gets worse with shoulder and elbow use, especially against resistance (lifting or carrying).  Bruising (contusion) in the arm or elbow after 24 to 48 hours.  Bulge can be seen and felt in the arm.  Limited motion of the shoulder or elbow.  Weakness with attempted elbow bending or rotation of the wrist, such as when using a screwdriver.  Crackling sound (crepitation) when the tendon or shoulder is moved or touched. CAUSES  A biceps tendon rupture occurs when the tendon is subjected to a force that is greater than it can withstand. For example, a sudden force straightening the elbow while the biceps is flexed, or a direct hit (trauma) (rare). RISK INCREASES WITH:   Sports that involve contact, or throwing and overhead activities (racquet sports, gymnastics, weightlifting, bodybuilding).  Heavy labor.  Poor strength and flexibility.  Failure to warm up properly before activity. PREVENTION  Warm up and stretch properly before activity.  Maintain physical fitness:  Strength, flexibility, and endurance.  Cardiovascular fitness.  Allow your body to recover between practices and  competition.  Learn and use proper exercise technique. PROGNOSIS  If treated properly, the symptoms of a proximal biceps tendon disruption usually go away within 12 weeks of injury.  RELATED COMPLICATIONS  Longer healing time if not properly treated, or if not given enough time to heal.  Recurring symptoms, especially if activity is resumed too soon.  Weakness of elbow bending and forearm rotation.  Prolonged disability (uncommon). TREATMENT Treatment first involves the use of ice and medicine, to reduce pain and inflammation. It is also important to perform strengthening and stretching exercises and to modify activities that cause symptoms to get worse. These exercises may be performed at home or with a therapist. It is not possible to surgically fix the tendon (sew it together). However, surgery may be performed to reinsert the tendon into the arm bone. This will make the arm look normal again. Surgery is most often advised for younger, active individuals, especially those who require strength of wrist rotation.  MEDICATION  If pain medicine is needed, nonsteroidal anti-inflammatory medicines (aspirin and ibuprofen), or other minor pain relievers (acetaminophen), are often advised.  Do not take pain medicine for 7 days before surgery.  Prescription pain relievers may be given if your caregiver thinks they are needed. Use only as directed and only as much as you need.  Corticosteroid injections may be given to help reduce inflammation, but are not usually recommended for this injury. HEAT AND COLD  Cold treatment (icing) should be applied for 10 to 15 minutes every 2 to 3 hours for inflammation and pain, and immediately after activity that aggravates your symptoms. Use ice packs   or an ice massage.  Heat treatment may be used before performing stretching and strengthening activities prescribed by your caregiver, physical therapist, or athletic trainer. Use a heat pack or a warm water  soak. SEEK MEDICAL CARE IF:   Symptoms get worse or do not improve in 2 weeks, despite treatment.  New, unexplained symptoms develop. (Drugs used in treatment may produce side effects.) EXERCISES RANGE OF MOTION (ROM) AND STRETCHING EXERCISES - Biceps Tendon Disruption (Proximal) These exercises may help you when beginning to rehabilitate your injury. Your symptoms may go away with or without further involvement from your physician, physical therapist or athletic trainer. While completing these exercises, remember:   Restoring tissue flexibility helps normal motion to return to the joints. This allows healthier, less painful movement and activity.  An effective stretch should be held for at least 30 seconds.  A stretch should never be painful. You should only feel a gentle lengthening or release in the stretched tissue. STRETCH - Flexion, Standing  Stand with good posture. With an underhand grip on your right / left hand and an overhand grip on the opposite hand, grasp a broomstick or cane so that your hands are a little more than shoulder width apart.  Keeping your right / left elbow straight and shoulder muscles relaxed, push the stick with your opposite hand to raise your right / left arm in front of your body and then overhead. Raise your arm until you feel a stretch in your right / left shoulder, but before you have increased shoulder pain.  Try to avoid shrugging your right / left shoulder as your arm rises by keeping your shoulder blade tucked down and toward your mid-back spine. Hold __________ seconds.  Slowly return to the starting position. Repeat __________ times. Complete this exercise __________ times per day. STRETCH - Abduction, Supine  Stand with good posture. With an underhand grip on your right / left hand and an overhand grip on the opposite hand, grasp a broomstick or cane so that your hands are a little more than shoulder-width apart.  Keeping your right / left  elbow straight and shoulder muscles relaxed, push the stick with your opposite hand to raise your right / left arm out to the side of your body and then overhead. Raise your arm until you feel a stretch in your right / left shoulder, but before you have increased shoulder pain.  Try to avoid shrugging your right / left shoulder as your arm rises, by keeping your shoulder blade tucked down and toward your mid-back spine. Hold for __________ seconds.  Slowly return to the starting position. Repeat __________ times. Complete this exercise __________ times per day. ROM - Flexion, Active-Assisted  Lie on your back. You may bend your knees for comfort.  Grasp a broomstick or cane so your hands are about shoulder width apart. Your right / left hand should grip the end of the stick so that your hand is positioned "thumbs-up," as if you were about to shake hands.  Using your healthy arm to lead, raise your right / left arm overhead until you feel a gentle stretch in your shoulder. Hold for right / left seconds.  Use the stick to assist in returning your right / left arm to its starting position. Repeat __________ times. Complete this exercise __________ times per day.  STRETCH - Flexion, Standing   Stand facing a wall. Walk your right / left fingers up the wall until you feel a moderate stretch in your   shoulder. As your hand gets higher, you may need to step closer to the wall or use a door frame to walk through.  Try to avoid shrugging your right / left shoulder as your arm rises, by keeping your shoulder blade tucked down and toward your mid-back spine.  Hold for __________ seconds. Use your other hand, if needed, to ease out of the stretch and return to the starting position. Repeat __________ times. Complete this exercise __________ times per day.  ROM - Internal Rotation   Using underhand grips, grasp a stick behind your back with both hands.  While standing upright with good posture, slide  the stick up your back until you feel a mild stretch in the front of your shoulder.  Hold for __________ seconds. Slowly return to your starting position. Repeat __________ times. Complete this exercise __________ times per day.  STRETCH - Internal Rotation  Place your right / left hand behind your back, palm-up.  Throw a towel or belt over your opposite shoulder. Grasp the towel with your right / left hand.  While keeping an upright posture, gently pull up on the towel until you feel a stretch in the front of your right / left shoulder.  Avoid shrugging your right / left shoulder as your arm rises, by keeping your shoulder blade tucked down and toward your mid-back spine.  Hold for __________ seconds. Release the stretch by lowering your opposite hand. Repeat __________ times. Complete this exercise __________ times per day. STRETCH - Elbow Flexors   Lie on a firm bed or countertop on your back. Be sure that you are in a comfortable position which will allow you to relax your arm muscles.  Place a folded towel under your right / left upper arm, so that your elbow and shoulder are at the same height. Extend your arm; your elbow should not rest on the bed or towel.  Allow the weight of your hand to straighten your elbow. Keep your arm and chest muscles relaxed. Your caregiver may ask you to increase the intensity of your stretch by adding a small wrist or hand weight.  Hold for __________ seconds. You should feel a stretch on the inside of your elbow. Slowly return to the starting position. Repeat __________ times. Complete this exercise __________ times per day. STRENGTHENING EXERCISES - Biceps Tendon Disruption (Proximal) These exercises may help you regain your strength after your physician has discontinued your restraint in a cast or brace. They may resolve your symptoms with or without further involvement from your physician, physical therapist or athletic trainer. While completing  these exercises, remember:   Muscles can gain both the endurance and the strength needed for everyday activities through controlled exercises.  Complete these exercises as instructed by your physician, physical therapist or athletic trainer. Progress the resistance and repetitions only as guided.  You may experience muscle soreness or fatigue, but the pain or discomfort you are trying to eliminate should never worsen during these exercises. If this pain does get worse, stop and make sure you are following the directions exactly. If the pain is still present after adjustments, discontinue the exercise until you can discuss the trouble with your clinician. STRENGTH - Elbow Flexors, Isometric   Stand or sit upright on a firm surface. Place your right / left arm so that your hand is palm-up and at the height of your waist.  Place your opposite hand on top of your forearm. Gently push down as your right / left arm   resists. Push as hard as you can with both arms, without causing any pain or movement at your right / left elbow. Hold this stationary position for __________ seconds.  Gradually release the tension in both arms. Allow your muscles to relax completely before repeating. Repeat __________ times. Complete this exercise __________ times per day. STRENGTH - Shoulder Flexion, Isometric  With good posture and facing a wall, stand or sit about 4-6 inches away.  Keeping your right / left elbow straight, gently press the top of your fist into the wall. Increase the pressure gradually until you are pressing as hard as you can without shrugging your shoulder or increasing any shoulder discomfort.  Hold for __________ seconds.  Release the tension slowly. Relax your shoulder muscles completely before you the next repetition. Repeat __________ times. Complete this exercise __________ times per day.  STRENGTH - Elbow Flexors, Supinated  With good posture, stand or sit on a firm chair without  armrests. Allow your right / left arm to rest at your side with your palm facing forward.  Holding a __________weight or gripping a rubber exercise band or tubing, bring your hand toward your shoulder.  Allow your muscles to control the resistance as your hand returns to your side. Repeat __________ times. Complete this exercise __________ times per day.  STRENGTH - Elbow Flexors, Neutral  With good posture, stand or sit on a firm chair without armrests. Allow your right / left arm to rest at your side with your thumb facing forward.  Holding a __________ weight or gripping a rubber exercise band or tubing, bring your hand toward your shoulder.  Allow your muscles to control the resistance as your hand returns to your side. Repeat __________ times. Complete this exercise __________ times per day.  STRENGTH - Shoulder Flexion   Stand or sit with good posture. Grasp a __________ weight, or an exercise band or tubing, so that your hand is "thumbs-up," like when you shake hands.  Slowly lift your right / left arm as far as you can without increasing any shoulder pain. Initially, many people can only raise their hand to shoulder height.  Avoid shrugging your right / left shoulder as your arm rises, by keeping your shoulder blade tucked down and toward your mid-back spine.  Hold for __________ seconds. Control the descent of your hand as you slowly return to your starting position. Repeat __________ times. Complete this exercise __________ times per day.  STRENGTH - Forearm Supinators   Sit with your right / left forearm supported on a table, keeping your elbow below shoulder height. Rest your hand over the edge, palm down.  Gently grip a hammer or a soup ladle.  Without moving your elbow, slowly turn your palm and hand upward to a "thumbs-up" position.  Hold this position for __________ seconds. Slowly return to the starting position. Repeat __________ times. Complete this exercise  __________ times per day.  STRENGTH - Forearm Pronators   Sit with your right / left forearm supported on a table, keeping your elbow below shoulder height. Rest your hand over the edge, palm up.  Gently grip a hammer or a soup ladle.  Without moving your elbow, slowly turn your palm and hand upward to a "thumbs-up" position.  Hold this position for __________ seconds. Slowly return to the starting position. Repeat __________ times. Complete this exercise __________ times per day.  Document Released: 10/03/2005 Document Revised: 12/26/2011 Document Reviewed: 01/15/2009 [employer] Patient Information 2015 [employer]. This information is not   intended to replace advice given to you by your health care provider. Make sure you discuss any questions you have with your health care provider.

## 2015-06-15 NOTE — Progress Notes (Signed)
Agree with Coumadin management 

## 2015-06-15 NOTE — Progress Notes (Signed)
Pre visit review using our clinic review tool, if applicable. No additional management support is needed unless otherwise documented below in the visit note. 

## 2015-06-15 NOTE — Progress Notes (Signed)
   Subjective:    Patient ID: Mark Wu, male    DOB: 1926-09-07, 81 y.o.   MRN: [id number]  HPI Patient seen for follow-up regarding recent right biceps injury and hypertension. Blood pressure has been stable at home. No headaches. No dizziness. Has long history of atrial fibrillation. Recent noticed some shortening of the biceps muscle on the right but no associated pain. His bruising is resolving. We suspected proximal biceps rupture with one head of the biceps tendon but other head intact. He has not noted any substantial weakness.  Past Medical History  Diagnosis Date  . HYPERTENSION 06/11/2009  . Atrial fibrillation 06/11/2009  . Anal fissure 06/11/2009  . NECK MASS 10/01/2010   Past Surgical History  Procedure Laterality Date  . Cystoscopy  2000    hand    reports that he quit smoking about 23 years ago. His smoking use included Cigars. He does not have any smokeless tobacco history on file. His alcohol and drug histories are not on file. family history is not on file. No Known Allergies    Review of Systems  Constitutional: Negative for fatigue.  Eyes: Negative for visual disturbance.  Respiratory: Negative for cough, chest tightness and shortness of breath.   Cardiovascular: Negative for chest pain, palpitations and leg swelling.  Neurological: Negative for dizziness, syncope, weakness, light-headedness, numbness and headaches.       Objective:   Physical Exam  Constitutional: He appears well-developed and well-nourished.  Cardiovascular:  Mild bradycardia  Pulmonary/Chest: Effort normal and breath sounds normal. No respiratory distress. He has no wheezes. He has no rales.  Musculoskeletal:  Right biceps nontender. Full strength. Lateral proximal biceps tendon appears to be ruptured. Medial head is intact          Assessment & Plan:  Probable tendon rupture right biceps. Other head intact. At this point, he has no pain and is not interested in surgical  referral.  Hypertension. Well controlled. Continue current medications

## 2015-06-16 DIAGNOSIS — H3532 Exudative age-related macular degeneration: Secondary | ICD-10-CM | POA: Diagnosis not present

## 2015-06-30 ENCOUNTER — Other Ambulatory Visit: Payer: Self-pay | Admitting: Family Medicine

## 2015-07-30 ENCOUNTER — Other Ambulatory Visit: Payer: Self-pay | Admitting: Family Medicine

## 2015-08-03 ENCOUNTER — Ambulatory Visit: Payer: TRICARE For Life (TFL)

## 2015-08-03 ENCOUNTER — Ambulatory Visit: Payer: Medicare Other | Admitting: *Deleted

## 2015-08-03 ENCOUNTER — Ambulatory Visit (INDEPENDENT_AMBULATORY_CARE_PROVIDER_SITE_OTHER): Payer: Medicare Other | Admitting: General Practice

## 2015-08-03 DIAGNOSIS — Z5181 Encounter for therapeutic drug level monitoring: Secondary | ICD-10-CM

## 2015-08-03 DIAGNOSIS — Z23 Encounter for immunization: Secondary | ICD-10-CM

## 2015-08-03 DIAGNOSIS — I4891 Unspecified atrial fibrillation: Secondary | ICD-10-CM | POA: Diagnosis not present

## 2015-08-03 LAB — POCT INR: INR: 2.6

## 2015-08-03 NOTE — Progress Notes (Signed)
Pre visit review using our clinic review tool, if applicable. No additional management support is needed unless otherwise documented below in the visit note. 

## 2015-08-03 NOTE — Progress Notes (Signed)
Agree with Coumadin management 

## 2015-08-11 DIAGNOSIS — H353221 Exudative age-related macular degeneration, left eye, with active choroidal neovascularization: Secondary | ICD-10-CM | POA: Diagnosis not present

## 2015-08-12 DIAGNOSIS — Z961 Presence of intraocular lens: Secondary | ICD-10-CM | POA: Diagnosis not present

## 2015-08-12 DIAGNOSIS — H353122 Nonexudative age-related macular degeneration, left eye, intermediate dry stage: Secondary | ICD-10-CM | POA: Diagnosis not present

## 2015-08-12 DIAGNOSIS — H353114 Nonexudative age-related macular degeneration, right eye, advanced atrophic with subfoveal involvement: Secondary | ICD-10-CM | POA: Diagnosis not present

## 2015-08-12 DIAGNOSIS — H04123 Dry eye syndrome of bilateral lacrimal glands: Secondary | ICD-10-CM | POA: Diagnosis not present

## 2015-09-07 LAB — POCT INR: INR: 2.5

## 2015-09-14 ENCOUNTER — Ambulatory Visit (INDEPENDENT_AMBULATORY_CARE_PROVIDER_SITE_OTHER): Payer: Medicare Other | Admitting: General Practice

## 2015-09-14 DIAGNOSIS — I4891 Unspecified atrial fibrillation: Secondary | ICD-10-CM | POA: Diagnosis not present

## 2015-09-14 DIAGNOSIS — Z5181 Encounter for therapeutic drug level monitoring: Secondary | ICD-10-CM | POA: Diagnosis not present

## 2015-09-14 NOTE — Progress Notes (Signed)
Pre visit review using our clinic review tool, if applicable. No additional management support is needed unless otherwise documented below in the visit note. 

## 2015-09-14 NOTE — Progress Notes (Addendum)
Agree with Coumadin management 

## 2015-09-27 ENCOUNTER — Other Ambulatory Visit: Payer: Self-pay | Admitting: Family Medicine

## 2015-10-21 DIAGNOSIS — H353134 Nonexudative age-related macular degeneration, bilateral, advanced atrophic with subfoveal involvement: Secondary | ICD-10-CM | POA: Diagnosis not present

## 2015-10-21 DIAGNOSIS — H35352 Cystoid macular degeneration, left eye: Secondary | ICD-10-CM | POA: Diagnosis not present

## 2015-10-21 DIAGNOSIS — H353221 Exudative age-related macular degeneration, left eye, with active choroidal neovascularization: Secondary | ICD-10-CM | POA: Diagnosis not present

## 2015-10-21 DIAGNOSIS — H353212 Exudative age-related macular degeneration, right eye, with inactive choroidal neovascularization: Secondary | ICD-10-CM | POA: Diagnosis not present

## 2015-10-26 ENCOUNTER — Ambulatory Visit: Payer: Medicare Other

## 2015-10-30 DIAGNOSIS — K4090 Unilateral inguinal hernia, without obstruction or gangrene, not specified as recurrent: Secondary | ICD-10-CM | POA: Diagnosis not present

## 2015-10-30 DIAGNOSIS — N433 Hydrocele, unspecified: Secondary | ICD-10-CM | POA: Diagnosis not present

## 2015-11-02 ENCOUNTER — Ambulatory Visit (INDEPENDENT_AMBULATORY_CARE_PROVIDER_SITE_OTHER): Payer: Medicare Other | Admitting: General Practice

## 2015-11-02 ENCOUNTER — Other Ambulatory Visit: Payer: Self-pay | Admitting: General Practice

## 2015-11-02 DIAGNOSIS — Z5181 Encounter for therapeutic drug level monitoring: Secondary | ICD-10-CM

## 2015-11-02 DIAGNOSIS — I4891 Unspecified atrial fibrillation: Secondary | ICD-10-CM

## 2015-11-02 LAB — POCT INR: INR: 2.3

## 2015-11-02 MED ORDER — COUMADIN 2.5 MG PO TABS: ORAL_TABLET | ORAL | Status: DC

## 2015-11-02 NOTE — Progress Notes (Signed)
Pre visit review using our clinic review tool, if applicable. No additional management support is needed unless otherwise documented below in the visit note. 

## 2015-11-25 DIAGNOSIS — H353212 Exudative age-related macular degeneration, right eye, with inactive choroidal neovascularization: Secondary | ICD-10-CM | POA: Diagnosis not present

## 2015-11-25 DIAGNOSIS — H353221 Exudative age-related macular degeneration, left eye, with active choroidal neovascularization: Secondary | ICD-10-CM | POA: Diagnosis not present

## 2015-12-14 ENCOUNTER — Ambulatory Visit (INDEPENDENT_AMBULATORY_CARE_PROVIDER_SITE_OTHER): Payer: Medicare Other | Admitting: General Practice

## 2015-12-14 DIAGNOSIS — I4891 Unspecified atrial fibrillation: Secondary | ICD-10-CM | POA: Diagnosis not present

## 2015-12-14 DIAGNOSIS — Z5181 Encounter for therapeutic drug level monitoring: Secondary | ICD-10-CM | POA: Diagnosis not present

## 2015-12-14 LAB — POCT INR: INR: 2.2

## 2015-12-14 NOTE — Progress Notes (Signed)
Pre visit review using our clinic review tool, if applicable. No additional management support is needed unless otherwise documented below in the visit note. 

## 2015-12-31 DIAGNOSIS — H353221 Exudative age-related macular degeneration, left eye, with active choroidal neovascularization: Secondary | ICD-10-CM | POA: Diagnosis not present

## 2015-12-31 DIAGNOSIS — H353212 Exudative age-related macular degeneration, right eye, with inactive choroidal neovascularization: Secondary | ICD-10-CM | POA: Diagnosis not present

## 2015-12-31 DIAGNOSIS — H353211 Exudative age-related macular degeneration, right eye, with active choroidal neovascularization: Secondary | ICD-10-CM | POA: Diagnosis not present

## 2016-01-25 ENCOUNTER — Ambulatory Visit (INDEPENDENT_AMBULATORY_CARE_PROVIDER_SITE_OTHER): Payer: Medicare Other | Admitting: General Practice

## 2016-01-25 DIAGNOSIS — I4891 Unspecified atrial fibrillation: Secondary | ICD-10-CM | POA: Diagnosis not present

## 2016-01-25 DIAGNOSIS — Z5181 Encounter for therapeutic drug level monitoring: Secondary | ICD-10-CM

## 2016-01-25 LAB — POCT INR: INR: 2.3

## 2016-01-25 NOTE — Progress Notes (Signed)
Pre visit review using our clinic review tool, if applicable. No additional management support is needed unless otherwise documented below in the visit note. 

## 2016-01-27 ENCOUNTER — Ambulatory Visit (INDEPENDENT_AMBULATORY_CARE_PROVIDER_SITE_OTHER): Payer: Medicare Other | Admitting: Family Medicine

## 2016-01-27 VITALS — BP 140/80 | HR 52 | Temp 97.4°F | Ht 69.0 in | Wt 180.0 lb

## 2016-01-27 DIAGNOSIS — K219 Gastro-esophageal reflux disease without esophagitis: Secondary | ICD-10-CM

## 2016-01-27 NOTE — Progress Notes (Signed)
Pre visit review using our clinic review tool, if applicable. No additional management support is needed unless otherwise documented below in the visit note. 

## 2016-01-27 NOTE — Progress Notes (Signed)
   Subjective:    Patient ID: Mark Wu, male    DOB: 07-Mar-1926, 82 y.o.   MRN: [id number]  HPI  Patient seen with possible GERD symptoms. He has noted over recent months frequent belching after eating. Sometimes notices a sour taste in his mouth. He has occasional burning esophageal region. No pain with swallowing. Only rare episodes of mild dysphagia. No appetite or weight changes. He does drink a lot of caffeine in the form of tea. Currently does not taking anything for reflux symptoms. No exertional chest pains. No dyspnea.  Past Medical History  Diagnosis Date  . HYPERTENSION 06/11/2009  . Atrial fibrillation 06/11/2009  . Anal fissure 06/11/2009  . NECK MASS 10/01/2010   Past Surgical History  Procedure Laterality Date  . Cystoscopy  2000    hand    reports that he quit smoking about 24 years ago. His smoking use included Cigars. He does not have any smokeless tobacco history on file. His alcohol and drug histories are not on file. family history is not on file. No Known Allergies   Review of Systems  Constitutional: Negative for appetite change and unexpected weight change.  Respiratory: Negative for shortness of breath.   Cardiovascular: Negative for chest pain.  Gastrointestinal: Negative for abdominal pain.       Objective:   Physical Exam  Constitutional: He appears well-developed and well-nourished.  Neck: Neck supple. No thyromegaly present.  Cardiovascular: Normal rate.   Pulmonary/Chest: Effort normal and breath sounds normal. No respiratory distress. He has no wheezes. He has no rales.  Abdominal: Soft. Bowel sounds are normal. He exhibits no distension and no mass. There is no tenderness. There is no rebound and no guarding.  Lymphadenopathy:    He has no cervical adenopathy.          Assessment & Plan:  GERD. Lifestyle management discussed. Dietary factors discussed. Gradually reduce caffeine use. Avoid eating within 2-3 hours of bedtime.  Handout given. Try over-the-counter Zantac or Pepcid twice daily. Reassess one month. If not improved at that point consider trial of proton pump inhibitor. He does not have any red flags such as appetite or weight changes.

## 2016-01-27 NOTE — Patient Instructions (Signed)
Food Choices for Gastroesophageal Reflux Disease, Adult When you have gastroesophageal reflux disease (GERD), the foods you eat and your eating habits are very important. Choosing the right foods can help ease the discomfort of GERD. WHAT GENERAL GUIDELINES DO I NEED TO FOLLOW?  Choose fruits, vegetables, whole grains, low-fat dairy products, and low-fat meat, fish, and poultry.  Limit fats such as oils, salad dressings, butter, nuts, and avocado.  Keep a food diary to identify foods that cause symptoms.  Avoid foods that cause reflux. These may be different for different people.  Eat frequent small meals instead of three large meals each day.  Eat your meals slowly, in a relaxed setting.  Limit fried foods.  Cook foods using methods other than frying.  Avoid drinking alcohol.  Avoid drinking large amounts of liquids with your meals.  Avoid bending over or lying down until 2-3 hours after eating. WHAT FOODS ARE NOT RECOMMENDED? The following are some foods and drinks that may worsen your symptoms: Vegetables Tomatoes. Tomato juice. Tomato and spaghetti sauce. Chili peppers. Onion and garlic. Horseradish. Fruits Oranges, grapefruit, and lemon (fruit and juice). Meats High-fat meats, fish, and poultry. This includes hot dogs, ribs, ham, sausage, salami, and bacon. Dairy Whole milk and chocolate milk. Sour cream. Cream. Butter. Ice cream. Cream cheese.  Beverages Coffee and tea, with or without caffeine. Carbonated beverages or energy drinks. Condiments Hot sauce. Barbecue sauce.  Sweets/Desserts Chocolate and cocoa. Donuts. Peppermint and spearmint. Fats and Oils High-fat foods, including [place] fries and potato chips. Other Vinegar. Strong spices, such as black pepper, white pepper, red pepper, cayenne, curry powder, cloves, ginger, and chili powder. The items listed above may not be a complete list of foods and beverages to avoid. Contact your dietitian for more  information.   This information is not intended to replace advice given to you by your health care provider. Make sure you discuss any questions you have with your health care provider.   Document Released: 10/03/2005 Document Revised: 10/24/2014 Document Reviewed: 08/07/2013 Elsevier Interactive Patient Education 2016 [employer].  Avoid eating within 2-3 hours of sleep Try over the counter Zantac 150 mg twice daily or Pepcid 20 mg twice daily.

## 2016-02-04 DIAGNOSIS — H353221 Exudative age-related macular degeneration, left eye, with active choroidal neovascularization: Secondary | ICD-10-CM | POA: Diagnosis not present

## 2016-02-04 DIAGNOSIS — H353212 Exudative age-related macular degeneration, right eye, with inactive choroidal neovascularization: Secondary | ICD-10-CM | POA: Diagnosis not present

## 2016-02-23 ENCOUNTER — Other Ambulatory Visit: Payer: Self-pay | Admitting: Family Medicine

## 2016-03-07 ENCOUNTER — Ambulatory Visit (INDEPENDENT_AMBULATORY_CARE_PROVIDER_SITE_OTHER): Payer: Medicare Other | Admitting: General Practice

## 2016-03-07 ENCOUNTER — Ambulatory Visit: Payer: Medicare Other

## 2016-03-07 DIAGNOSIS — I4891 Unspecified atrial fibrillation: Secondary | ICD-10-CM

## 2016-03-07 DIAGNOSIS — Z5181 Encounter for therapeutic drug level monitoring: Secondary | ICD-10-CM | POA: Diagnosis not present

## 2016-03-07 LAB — POCT INR: INR: 2

## 2016-03-07 NOTE — Progress Notes (Signed)
Pre visit review using our clinic review tool, if applicable. No additional management support is needed unless otherwise documented below in the visit note. 

## 2016-03-10 DIAGNOSIS — H353221 Exudative age-related macular degeneration, left eye, with active choroidal neovascularization: Secondary | ICD-10-CM | POA: Diagnosis not present

## 2016-03-16 ENCOUNTER — Other Ambulatory Visit: Payer: Self-pay | Admitting: General Practice

## 2016-03-16 ENCOUNTER — Telehealth: Payer: Self-pay | Admitting: Family Medicine

## 2016-03-16 MED ORDER — COUMADIN 2.5 MG PO TABS: ORAL_TABLET | ORAL | Status: DC

## 2016-03-16 NOTE — Telephone Encounter (Signed)
Pt need refill on coumadin 2.5 mg #90 w/[facility]ills [facility]

## 2016-03-26 ENCOUNTER — Other Ambulatory Visit: Payer: Self-pay | Admitting: Family Medicine

## 2016-04-14 DIAGNOSIS — H35352 Cystoid macular degeneration, left eye: Secondary | ICD-10-CM | POA: Diagnosis not present

## 2016-04-14 DIAGNOSIS — H353221 Exudative age-related macular degeneration, left eye, with active choroidal neovascularization: Secondary | ICD-10-CM | POA: Diagnosis not present

## 2016-04-14 DIAGNOSIS — H353134 Nonexudative age-related macular degeneration, bilateral, advanced atrophic with subfoveal involvement: Secondary | ICD-10-CM | POA: Diagnosis not present

## 2016-04-14 DIAGNOSIS — H353212 Exudative age-related macular degeneration, right eye, with inactive choroidal neovascularization: Secondary | ICD-10-CM | POA: Diagnosis not present

## 2016-04-25 ENCOUNTER — Ambulatory Visit (INDEPENDENT_AMBULATORY_CARE_PROVIDER_SITE_OTHER): Payer: Medicare Other | Admitting: General Practice

## 2016-04-25 DIAGNOSIS — Z5181 Encounter for therapeutic drug level monitoring: Secondary | ICD-10-CM | POA: Diagnosis not present

## 2016-04-25 DIAGNOSIS — I4891 Unspecified atrial fibrillation: Secondary | ICD-10-CM | POA: Diagnosis not present

## 2016-04-25 LAB — POCT INR: INR: 2.6

## 2016-04-25 NOTE — Progress Notes (Signed)
Pre visit review using our clinic review tool, if applicable. No additional management support is needed unless otherwise documented below in the visit note. 

## 2016-05-17 ENCOUNTER — Encounter: Payer: Self-pay | Admitting: Gastroenterology

## 2016-05-17 ENCOUNTER — Ambulatory Visit (INDEPENDENT_AMBULATORY_CARE_PROVIDER_SITE_OTHER): Payer: Medicare Other | Admitting: Family Medicine

## 2016-05-17 ENCOUNTER — Encounter: Payer: Self-pay | Admitting: Family Medicine

## 2016-05-17 VITALS — BP 132/62 | HR 52 | Temp 97.5°F | Ht 69.0 in | Wt 175.4 lb

## 2016-05-17 DIAGNOSIS — R1310 Dysphagia, unspecified: Secondary | ICD-10-CM | POA: Diagnosis not present

## 2016-05-17 DIAGNOSIS — I10 Essential (primary) hypertension: Secondary | ICD-10-CM | POA: Diagnosis not present

## 2016-05-17 DIAGNOSIS — I4891 Unspecified atrial fibrillation: Secondary | ICD-10-CM | POA: Diagnosis not present

## 2016-05-17 NOTE — Progress Notes (Signed)
Pre visit review using our clinic review tool, if applicable. No additional management support is needed unless otherwise documented below in the visit note. 

## 2016-05-17 NOTE — Progress Notes (Signed)
Subjective:     Patient ID: Mark Wu, male   DOB: 11/06/1925, 82 y.o.   MRN: [id number]  HPI Patient seen for medical follow-up Long-standing history of atrial fibrillation. Has been on Coumadin for several years. He is followed through our Coumadin clinic. He has not had any recent dizziness, chest pains, or bleeding complications.  Hypertension treated with metoprolol and amlodipine. Compliant with all medications.  New problem of solid food dysphagia. Progressive over several months. No dysphagia liquids. He's had some mild weight loss in recent months but states his appetite is good. He does have occasional reflux symptoms. Currently takes Zantac. Wife states that he drinks tea frequently through the day.  No prior history of esophageal stricture or prior EGD  Past Medical History:  Diagnosis Date  . Anal fissure 06/11/2009  . Atrial fibrillation ([place]) 06/11/2009  . HYPERTENSION 06/11/2009  . NECK MASS 10/01/2010   Past Surgical History:  Procedure Laterality Date  . CYSTOSCOPY  2000   hand    reports that he quit smoking about 24 years ago. His smoking use included Cigars. He quit after 50.00 years of use. He does not have any smokeless tobacco history on file. His alcohol and drug histories are not on file. family history is not on file. No Known Allergies   Review of Systems  Constitutional: Positive for unexpected weight change. Negative for appetite change.  HENT: Positive for trouble swallowing. Negative for voice change.   Respiratory: Negative for cough and shortness of breath.   Cardiovascular: Negative for chest pain and palpitations.  Gastrointestinal: Negative for abdominal pain, nausea and vomiting.  Neurological: Negative for dizziness.  Hematological: Does not bruise/bleed easily.       Objective:   Physical Exam  Constitutional: He appears well-developed and well-nourished.  Neck: Neck supple.  Cardiovascular: Normal rate.   Pulmonary/Chest: Effort  normal and breath sounds normal. No respiratory distress. He has no wheezes. He has no rales.  Abdominal: Soft. Bowel sounds are normal. He exhibits no distension and no mass. There is no tenderness. There is no rebound and no guarding.  Musculoskeletal: He exhibits no edema.  Lymphadenopathy:    He has no cervical adenopathy.       Assessment:     #1 hypertension. Initial reading was elevated but subsequent reading here today very well controlled  #2 chronic atrial fibrillation on Coumadin  #3 recent progressive solid food dysphagia. He has good appetite but has had some recent modest weight loss. Needs further evaluation     Plan:     -Continue current medications -Set up GI referral regarding his solid food dysphagia -He is encouraged to eat slowly and chew foods very well and also avoid hard to chew foods such as steak -Gradually reduce caffeine intake  [name] MD [facility] at [facility]

## 2016-05-17 NOTE — Patient Instructions (Addendum)
Dysphagia Swallowing problems (dysphagia) occur when solids and liquids seem to stick in your throat on the way down to your stomach, or the food takes longer to get to the stomach. Other symptoms include regurgitating food, noises coming from the throat, chest discomfort with swallowing, and a feeling of fullness or the feeling of something being stuck in your throat when swallowing. When blockage in your throat is complete, it may be associated with drooling. CAUSES  Problems with swallowing may occur because of problems with the muscles. The food cannot be propelled in the usual manner into your stomach. You may have ulcers, scar tissue, or inflammation in the tube down which food travels from your mouth to your stomach (esophagus), which blocks food from passing normally into the stomach. Causes of inflammation include:  Acid reflux from your stomach into your esophagus.  Infection.  Radiation treatment for cancer.  Medicines taken without enough fluids to wash them down into your stomach. You may have nerve problems that prevent signals from being sent to the muscles of your esophagus to contract and move your food down to your stomach. Globus pharyngeus is a relatively common problem in which there is a sense of an obstruction or difficulty in swallowing, without any physical abnormalities of the swallowing passages being found. This problem usually improves over time with reassurance and testing to rule out other causes. DIAGNOSIS Dysphagia can be diagnosed and its cause can be determined by tests in which you swallow a white substance that helps illuminate the inside of your throat (contrast medium) while X-rays are taken. Sometimes a flexible telescope that is inserted down your throat (endoscopy) to look at your esophagus and stomach is used. TREATMENT   If the dysphagia is caused by acid reflux or infection, medicines may be used.  If the dysphagia is caused by problems with your  swallowing muscles, swallowing therapy may be used to help you strengthen your swallowing muscles.  If the dysphagia is caused by a blockage or mass, procedures to remove the blockage may be done. HOME CARE INSTRUCTIONS  Try to eat soft food that is easier to swallow and check your weight on a daily basis to be sure that it is not decreasing.  Be sure to drink liquids when sitting upright (not lying down). SEEK MEDICAL CARE IF:  You are losing weight because you are unable to swallow.  You are coughing when you drink liquids (aspiration).  You are coughing up partially digested food. SEEK IMMEDIATE MEDICAL CARE IF:  You are unable to swallow your own saliva .  You are having shortness of breath or a fever, or both.  You have a hoarse voice along with difficulty swallowing. MAKE SURE YOU:  Understand these instructions.  Will watch your condition.  Will get help right away if you are not doing well or get worse.   This information is not intended to replace advice given to you by your health care provider. Make sure you discuss any questions you have with your health care provider.   Document Released: 09/30/2000 Document Revised: 10/24/2014 Document Reviewed: 03/22/2013 Elsevier Interactive Patient Education 2016 [employer]  Try to gradually reduce tea/caffeine intake Avoid hard to chew foods Eat slowly and chew foods well. We will set up GI appt.

## 2016-05-19 DIAGNOSIS — H353221 Exudative age-related macular degeneration, left eye, with active choroidal neovascularization: Secondary | ICD-10-CM | POA: Diagnosis not present

## 2016-05-19 DIAGNOSIS — H35052 Retinal neovascularization, unspecified, left eye: Secondary | ICD-10-CM | POA: Diagnosis not present

## 2016-05-27 ENCOUNTER — Encounter: Payer: Self-pay | Admitting: Gastroenterology

## 2016-06-06 ENCOUNTER — Ambulatory Visit (INDEPENDENT_AMBULATORY_CARE_PROVIDER_SITE_OTHER): Payer: Medicare Other | Admitting: General Practice

## 2016-06-06 DIAGNOSIS — I4891 Unspecified atrial fibrillation: Secondary | ICD-10-CM

## 2016-06-06 DIAGNOSIS — Z5181 Encounter for therapeutic drug level monitoring: Secondary | ICD-10-CM | POA: Diagnosis not present

## 2016-06-06 LAB — POCT INR: INR: 2.7

## 2016-06-23 DIAGNOSIS — H353221 Exudative age-related macular degeneration, left eye, with active choroidal neovascularization: Secondary | ICD-10-CM | POA: Diagnosis not present

## 2016-06-24 ENCOUNTER — Other Ambulatory Visit: Payer: Self-pay | Admitting: Family Medicine

## 2016-06-24 ENCOUNTER — Ambulatory Visit: Payer: Medicare Other | Admitting: Gastroenterology

## 2016-06-24 NOTE — Telephone Encounter (Signed)
Rx refill sent to pharmacy. 

## 2016-07-18 ENCOUNTER — Ambulatory Visit (INDEPENDENT_AMBULATORY_CARE_PROVIDER_SITE_OTHER): Payer: Medicare Other | Admitting: General Practice

## 2016-07-18 DIAGNOSIS — Z5181 Encounter for therapeutic drug level monitoring: Secondary | ICD-10-CM

## 2016-07-18 DIAGNOSIS — I4891 Unspecified atrial fibrillation: Secondary | ICD-10-CM | POA: Diagnosis not present

## 2016-07-18 LAB — POCT INR: INR: 2.2

## 2016-07-27 DIAGNOSIS — H353221 Exudative age-related macular degeneration, left eye, with active choroidal neovascularization: Secondary | ICD-10-CM | POA: Diagnosis not present

## 2016-08-08 ENCOUNTER — Encounter: Payer: Self-pay | Admitting: Gastroenterology

## 2016-08-08 ENCOUNTER — Ambulatory Visit (INDEPENDENT_AMBULATORY_CARE_PROVIDER_SITE_OTHER): Payer: Medicare Other | Admitting: Gastroenterology

## 2016-08-08 ENCOUNTER — Encounter (INDEPENDENT_AMBULATORY_CARE_PROVIDER_SITE_OTHER): Payer: Self-pay

## 2016-08-08 VITALS — BP 146/60 | HR 41 | Ht 69.0 in | Wt 180.0 lb

## 2016-08-08 DIAGNOSIS — R1314 Dysphagia, pharyngoesophageal phase: Secondary | ICD-10-CM | POA: Diagnosis not present

## 2016-08-08 DIAGNOSIS — R142 Eructation: Secondary | ICD-10-CM | POA: Diagnosis not present

## 2016-08-08 MED ORDER — OMEPRAZOLE 40 MG PO CPDR: 40.0000 mg | DELAYED_RELEASE_CAPSULE | Freq: Every day | ORAL | 3 refills | Status: DC

## 2016-08-08 NOTE — Progress Notes (Signed)
    History of Present Illness: This is an 82 year old male referred by [name], MD for the evaluation of dysphagia. He is accompanied by his wife. He relates several months of intermittent solid food dysphagia. He states he avoids certain foods. He has upper and lower denture plates and states his lower plates fit poorly. He has had worsening problems with belching over the past few months as well. He was tried on ranitidine for about one week with no change in symptoms. No prior history of GERD or esophageal stricture. He states he had a colonoscopy performed in about 1993 somewhere in this area but cannot recall where. I cannot locate any records in Epic. Denies weight loss, abdominal pain, constipation, diarrhea, change in stool caliber, melena, hematochezia, nausea, vomiting, chest pain.  Review of Systems: Pertinent positive and negative review of systems were noted in the above HPI section. All other review of systems were otherwise negative.  Current Medications, Allergies, Past Medical History, Past Surgical History, Family History and Social History were reviewed in [employer] record.  Physical Exam: General: Well developed, well nourished, elderly, no acute distress Head: Normocephalic and atraumatic Eyes:  sclerae anicteric, EOMI Ears: Normal auditory acuity Mouth: No deformity or lesions. U/L denture plates. L plates loose.  Neck: Supple, no masses or thyromegaly Lungs: Clear throughout to auscultation Heart: Regular rate and rhythm; no murmurs, rubs or bruits Abdomen: Soft, non tender and non distended. No masses, hepatosplenomegaly or hernias noted. Normal Bowel sounds Musculoskeletal: Symmetrical with no gross deformities  Skin: No lesions on visible extremities Pulses:  Normal pulses noted Extremities: No clubbing, cyanosis, edema or deformities noted Neurological: Alert oriented x 4, grossly nonfocal Cervical Nodes:  No significant cervical  adenopathy Inguinal Nodes: No significant inguinal adenopathy Psychological:  Alert and cooperative. Normal mood and affect  Assessment and Recommendations:  1. Intermittent solid food dysphagia. Upper and lower denture plates with poorly fitting lower plates. Rule out esophageal stricture, esophageal motility disorder, esophagitis. Rule out difficulties chewing certain foods leading to dysphagia. Begin omeprazole 40 mg daily and standard antireflux measures. Schedule barium esophagram. EGD may be needed for further evaluation pending findings on barium esophagram and response to omeprazole. Given his age and anticoagulation needs it would be ideal to avoid EGD and holding anticoaguation if possible.   cc: [name], MD [address] [place], [place] [address]

## 2016-08-08 NOTE — Patient Instructions (Signed)
We have sent the following prescriptions to your mail in pharmacy: omeprazole.   If you have not heard from your mail in pharmacy within 1 week or if you have not received your medication in the mail, please contact us at [phone] so we may find out why.    You have been scheduled for a Barium Esophogram at [facility] (1st floor of the hospital) on 08/15/16 at 1:30pm. Please arrive 15 minutes prior to your appointment for registration. Make certain not to have anything to eat or drink 8 hours prior to your test. If you need to reschedule for any reason, please contact radiology at [phone] to do so. __________________________________________________________________ A barium swallow is an examination that concentrates on views of the esophagus. This tends to be a double contrast exam (barium and two liquids which, when combined, create a gas to distend the wall of the oesophagus) or single contrast (non-ionic iodine based). The study is usually tailored to your symptoms so a good history is essential. Attention is paid during the study to the form, structure and configuration of the esophagus, looking for functional disorders (such as aspiration, dysphagia, achalasia, motility and reflux) EXAMINATION You may be asked to change into a gown, depending on the type of swallow being performed. A radiologist and radiographer will perform the procedure. The radiologist will advise you of the type of contrast selected for your procedure and direct you during the exam. You will be asked to stand, sit or lie in several different positions and to hold a small amount of fluid in your mouth before being asked to swallow while the imaging is performed .In some instances you may be asked to swallow barium coated marshmallows to assess the motility of a solid food bolus. The exam can be recorded as a digital or video fluoroscopy procedure. POST PROCEDURE It will take 1-2 days for the barium to pass  through your system. To facilitate this, it is important, unless otherwise directed, to increase your fluids for the next 24-48hrs and to resume your normal diet.  This test typically takes about 30 minutes to perform. _________________________________________________________________________ Thank you for choosing me and [facility].  Pricilla Riffle. Dagoberto Ligas., MD., [facility]

## 2016-08-15 ENCOUNTER — Ambulatory Visit (HOSPITAL_COMMUNITY)
Admission: RE | Admit: 2016-08-15 | Discharge: 2016-08-15 | Disposition: A | Discharge status: Home / Self Care | Payer: Medicare Other | Source: Ambulatory Visit | Attending: Gastroenterology | Admitting: Gastroenterology

## 2016-08-15 DIAGNOSIS — K449 Diaphragmatic hernia without obstruction or gangrene: Secondary | ICD-10-CM | POA: Insufficient documentation

## 2016-08-15 DIAGNOSIS — R1310 Dysphagia, unspecified: Secondary | ICD-10-CM | POA: Diagnosis not present

## 2016-08-15 DIAGNOSIS — R142 Eructation: Secondary | ICD-10-CM

## 2016-08-15 DIAGNOSIS — K222 Esophageal obstruction: Secondary | ICD-10-CM | POA: Insufficient documentation

## 2016-08-15 DIAGNOSIS — R1314 Dysphagia, pharyngoesophageal phase: Secondary | ICD-10-CM | POA: Diagnosis not present

## 2016-08-19 ENCOUNTER — Telehealth: Payer: Self-pay | Admitting: Gastroenterology

## 2016-08-19 NOTE — Telephone Encounter (Signed)
Results printed and mailed today.

## 2016-08-29 ENCOUNTER — Ambulatory Visit (INDEPENDENT_AMBULATORY_CARE_PROVIDER_SITE_OTHER): Payer: Medicare Other

## 2016-08-29 DIAGNOSIS — Z5181 Encounter for therapeutic drug level monitoring: Secondary | ICD-10-CM | POA: Diagnosis not present

## 2016-08-29 DIAGNOSIS — I4891 Unspecified atrial fibrillation: Secondary | ICD-10-CM

## 2016-08-29 LAB — POCT INR: INR: 2.5

## 2016-08-29 NOTE — Patient Instructions (Signed)
Pre visit review using our clinic review tool, if applicable. No additional management support is needed unless otherwise documented below in the visit note. 

## 2016-08-29 NOTE — Progress Notes (Signed)
I agree with this plan.

## 2016-09-01 DIAGNOSIS — H353134 Nonexudative age-related macular degeneration, bilateral, advanced atrophic with subfoveal involvement: Secondary | ICD-10-CM | POA: Diagnosis not present

## 2016-09-01 DIAGNOSIS — H353221 Exudative age-related macular degeneration, left eye, with active choroidal neovascularization: Secondary | ICD-10-CM | POA: Diagnosis not present

## 2016-09-01 DIAGNOSIS — H35352 Cystoid macular degeneration, left eye: Secondary | ICD-10-CM | POA: Diagnosis not present

## 2016-09-01 DIAGNOSIS — H353212 Exudative age-related macular degeneration, right eye, with inactive choroidal neovascularization: Secondary | ICD-10-CM | POA: Diagnosis not present

## 2016-09-06 DIAGNOSIS — H353114 Nonexudative age-related macular degeneration, right eye, advanced atrophic with subfoveal involvement: Secondary | ICD-10-CM | POA: Diagnosis not present

## 2016-09-06 DIAGNOSIS — H02831 Dermatochalasis of right upper eyelid: Secondary | ICD-10-CM | POA: Diagnosis not present

## 2016-09-06 DIAGNOSIS — H04123 Dry eye syndrome of bilateral lacrimal glands: Secondary | ICD-10-CM | POA: Diagnosis not present

## 2016-09-06 DIAGNOSIS — Z961 Presence of intraocular lens: Secondary | ICD-10-CM | POA: Diagnosis not present

## 2016-09-06 DIAGNOSIS — H353221 Exudative age-related macular degeneration, left eye, with active choroidal neovascularization: Secondary | ICD-10-CM | POA: Diagnosis not present

## 2016-09-06 DIAGNOSIS — H02834 Dermatochalasis of left upper eyelid: Secondary | ICD-10-CM | POA: Diagnosis not present

## 2016-09-28 ENCOUNTER — Other Ambulatory Visit: Payer: Self-pay | Admitting: General Practice

## 2016-09-28 ENCOUNTER — Telehealth: Payer: Self-pay | Admitting: Family Medicine

## 2016-09-28 MED ORDER — COUMADIN 2.5 MG PO TABS: ORAL_TABLET | ORAL | 1 refills | Status: DC

## 2016-09-28 NOTE — Telephone Encounter (Signed)
Pt request refill  COUMADIN 2.5 MG tablet  90 day  Pt states it must state "BRAND NAME ONLY"  [facility]

## 2016-10-03 ENCOUNTER — Ambulatory Visit: Payer: Medicare Other

## 2016-10-03 DIAGNOSIS — H353221 Exudative age-related macular degeneration, left eye, with active choroidal neovascularization: Secondary | ICD-10-CM | POA: Diagnosis not present

## 2016-10-22 ENCOUNTER — Other Ambulatory Visit: Payer: Self-pay | Admitting: Family Medicine

## 2016-10-24 ENCOUNTER — Ambulatory Visit (INDEPENDENT_AMBULATORY_CARE_PROVIDER_SITE_OTHER): Payer: Medicare Other | Admitting: General Practice

## 2016-10-24 DIAGNOSIS — I4891 Unspecified atrial fibrillation: Secondary | ICD-10-CM

## 2016-10-24 DIAGNOSIS — Z5181 Encounter for therapeutic drug level monitoring: Secondary | ICD-10-CM | POA: Diagnosis not present

## 2016-10-24 LAB — POCT INR: INR: 2

## 2016-10-24 NOTE — Patient Instructions (Signed)
Pre visit review using our clinic review tool, if applicable. No additional management support is needed unless otherwise documented below in the visit note. 

## 2016-11-09 DIAGNOSIS — H35352 Cystoid macular degeneration, left eye: Secondary | ICD-10-CM | POA: Diagnosis not present

## 2016-11-09 DIAGNOSIS — H353221 Exudative age-related macular degeneration, left eye, with active choroidal neovascularization: Secondary | ICD-10-CM | POA: Diagnosis not present

## 2016-11-09 DIAGNOSIS — H35052 Retinal neovascularization, unspecified, left eye: Secondary | ICD-10-CM | POA: Diagnosis not present

## 2016-11-09 DIAGNOSIS — H353212 Exudative age-related macular degeneration, right eye, with inactive choroidal neovascularization: Secondary | ICD-10-CM | POA: Diagnosis not present

## 2016-11-09 DIAGNOSIS — H353134 Nonexudative age-related macular degeneration, bilateral, advanced atrophic with subfoveal involvement: Secondary | ICD-10-CM | POA: Diagnosis not present

## 2016-11-14 ENCOUNTER — Encounter: Payer: Self-pay | Admitting: Family Medicine

## 2016-11-14 ENCOUNTER — Ambulatory Visit (INDEPENDENT_AMBULATORY_CARE_PROVIDER_SITE_OTHER): Payer: Medicare Other | Admitting: Family Medicine

## 2016-11-14 VITALS — BP 128/70 | HR 52 | Ht 66.0 in | Wt 178.2 lb

## 2016-11-14 DIAGNOSIS — I4891 Unspecified atrial fibrillation: Secondary | ICD-10-CM

## 2016-11-14 DIAGNOSIS — I10 Essential (primary) hypertension: Secondary | ICD-10-CM

## 2016-11-14 DIAGNOSIS — K219 Gastro-esophageal reflux disease without esophagitis: Secondary | ICD-10-CM

## 2016-11-14 LAB — BASIC METABOLIC PANEL
BUN: 11 mg/dL (ref 6–23)
CHLORIDE: 105 meq/L (ref 96–112)
CO2: 25 meq/L (ref 19–32)
Calcium: 9.1 mg/dL (ref 8.4–10.5)
Creatinine, Ser: 1.09 mg/dL (ref 0.40–1.50)
GFR: 67.82 mL/min (ref >60.00)
Glucose, Bld: 93 mg/dL (ref 70–99)
Potassium: 4 mEq/L (ref 3.5–5.1)
SODIUM: 138 meq/L (ref 135–145)

## 2016-11-14 NOTE — Progress Notes (Signed)
Pre visit review using our clinic review tool, if applicable. No additional management support is needed unless otherwise documented below in the visit note. 

## 2016-11-14 NOTE — Progress Notes (Signed)
Subjective:     Patient ID: Mark Wu, male   DOB: 17-Apr-1926, 83 y.o.   MRN: [id number]  HPI Seen for medical follow-up. He has hypertension which is treated with amlodipine and Toprol. Blood pressures been stable by home readings. No dizziness. No chest pains. Rare palpitation. No recent headaches. Patient compliant with therapy  Long history of atrial fibrillation. He's been rate controlled with Toprol and remains on Coumadin. His pro times have been very stable. Denies any recent bleeding complications  Patient had some dysphagia last year. He had barium swallow which showed only minimal esophageal narrowing. He is currently on omeprazole 40 mrem daily. No active GERD symptoms. Denies any recent dysphagia. Appetite is stable.  Past Medical History:  Diagnosis Date  . Anal fissure 06/11/2009  . Atrial fibrillation ([place]) 06/11/2009  . HYPERTENSION 06/11/2009  . NECK MASS 10/01/2010   Past Surgical History:  Procedure Laterality Date  . CYSTOSCOPY  2000   hand    reports that he quit smoking about 25 years ago. His smoking use included Cigars. He quit after 50.00 years of use. He has never used smokeless tobacco. He reports that he does not drink alcohol or use drugs. family history is not on file. No Known Allergies   Review of Systems  Constitutional: Negative for appetite change, fatigue and unexpected weight change.  HENT: Negative for trouble swallowing.   Eyes: Negative for visual disturbance.  Respiratory: Negative for cough, chest tightness and shortness of breath.   Cardiovascular: Negative for chest pain, palpitations and leg swelling.  Endocrine: Negative for polydipsia and polyuria.  Neurological: Negative for dizziness, syncope, weakness, light-headedness and headaches.       Objective:   Physical Exam  Constitutional: He is oriented to person, place, and time. He appears well-developed and well-nourished.  HENT:  Right Ear: External ear normal.  Left Ear:  External ear normal.  Mouth/Throat: Oropharynx is clear and moist.  Eyes: Pupils are equal, round, and reactive to light.  Neck: Neck supple. No thyromegaly present.  Cardiovascular:  Mild bradycardia with rate around 52-56 and mostly regular  Pulmonary/Chest: Effort normal and breath sounds normal. No respiratory distress. He has no wheezes. He has no rales.  Musculoskeletal: He exhibits no edema.  Neurological: He is alert and oriented to person, place, and time.       Assessment:     #1 hypertension stable and at goal  #2 long-standing history of atrial fibrillation on Coumadin  #3 GERD stable    Plan:     -Check basic metabolic panel -Continue current medications -Routine follow-up in 6 months -Discussed fall prevention  [name] MD [facility] at [facility]

## 2016-11-14 NOTE — Patient Instructions (Signed)
Continue with current medications Follow up with coumadin clinic as scheduled.

## 2016-11-21 DIAGNOSIS — Z1283 Encounter for screening for malignant neoplasm of skin: Secondary | ICD-10-CM | POA: Diagnosis not present

## 2016-11-21 DIAGNOSIS — D2339 Other benign neoplasm of skin of other parts of face: Secondary | ICD-10-CM | POA: Diagnosis not present

## 2016-11-21 DIAGNOSIS — Z85828 Personal history of other malignant neoplasm of skin: Secondary | ICD-10-CM | POA: Diagnosis not present

## 2016-11-21 DIAGNOSIS — Z08 Encounter for follow-up examination after completed treatment for malignant neoplasm: Secondary | ICD-10-CM | POA: Diagnosis not present

## 2016-11-21 DIAGNOSIS — D225 Melanocytic nevi of trunk: Secondary | ICD-10-CM | POA: Diagnosis not present

## 2016-11-21 DIAGNOSIS — D2312 Other benign neoplasm of skin of left eyelid, including canthus: Secondary | ICD-10-CM | POA: Diagnosis not present

## 2016-11-28 ENCOUNTER — Ambulatory Visit (INDEPENDENT_AMBULATORY_CARE_PROVIDER_SITE_OTHER): Payer: Medicare Other | Admitting: General Practice

## 2016-11-28 DIAGNOSIS — Z5181 Encounter for therapeutic drug level monitoring: Secondary | ICD-10-CM

## 2016-11-28 DIAGNOSIS — I4891 Unspecified atrial fibrillation: Secondary | ICD-10-CM | POA: Diagnosis not present

## 2016-11-28 LAB — POCT INR: INR: 2.2

## 2016-11-28 NOTE — Patient Instructions (Signed)
Pre visit review using our clinic review tool, if applicable. No additional management support is needed unless otherwise documented below in the visit note. 

## 2016-12-14 DIAGNOSIS — H353221 Exudative age-related macular degeneration, left eye, with active choroidal neovascularization: Secondary | ICD-10-CM | POA: Diagnosis not present

## 2016-12-21 ENCOUNTER — Other Ambulatory Visit: Payer: Self-pay | Admitting: Family Medicine

## 2017-01-09 ENCOUNTER — Ambulatory Visit (INDEPENDENT_AMBULATORY_CARE_PROVIDER_SITE_OTHER): Payer: Medicare Other | Admitting: General Practice

## 2017-01-09 DIAGNOSIS — Z5181 Encounter for therapeutic drug level monitoring: Secondary | ICD-10-CM

## 2017-01-09 DIAGNOSIS — I4891 Unspecified atrial fibrillation: Secondary | ICD-10-CM | POA: Diagnosis not present

## 2017-01-09 LAB — POCT INR
INR: 2.2
INR: 2.5

## 2017-01-09 NOTE — Patient Instructions (Signed)
Pre visit review using our clinic review tool, if applicable. No additional management support is needed unless otherwise documented below in the visit note. 

## 2017-01-18 DIAGNOSIS — H353221 Exudative age-related macular degeneration, left eye, with active choroidal neovascularization: Secondary | ICD-10-CM | POA: Diagnosis not present

## 2017-01-18 DIAGNOSIS — H353212 Exudative age-related macular degeneration, right eye, with inactive choroidal neovascularization: Secondary | ICD-10-CM | POA: Diagnosis not present

## 2017-01-24 ENCOUNTER — Other Ambulatory Visit: Payer: Self-pay | Admitting: Family Medicine

## 2017-02-16 ENCOUNTER — Other Ambulatory Visit: Payer: Self-pay | Admitting: Family Medicine

## 2017-02-20 ENCOUNTER — Ambulatory Visit (INDEPENDENT_AMBULATORY_CARE_PROVIDER_SITE_OTHER): Payer: Medicare Other | Admitting: General Practice

## 2017-02-20 DIAGNOSIS — Z5181 Encounter for therapeutic drug level monitoring: Secondary | ICD-10-CM

## 2017-02-20 DIAGNOSIS — I4891 Unspecified atrial fibrillation: Secondary | ICD-10-CM

## 2017-02-20 LAB — POCT INR: INR: 2.1

## 2017-02-20 NOTE — Patient Instructions (Signed)
Pre visit review using our clinic review tool, if applicable. No additional management support is needed unless otherwise documented below in the visit note. 

## 2017-02-22 DIAGNOSIS — H353221 Exudative age-related macular degeneration, left eye, with active choroidal neovascularization: Secondary | ICD-10-CM | POA: Diagnosis not present

## 2017-02-22 DIAGNOSIS — H353212 Exudative age-related macular degeneration, right eye, with inactive choroidal neovascularization: Secondary | ICD-10-CM | POA: Diagnosis not present

## 2017-03-17 ENCOUNTER — Ambulatory Visit (INDEPENDENT_AMBULATORY_CARE_PROVIDER_SITE_OTHER): Payer: Medicare Other | Admitting: Family Medicine

## 2017-03-17 ENCOUNTER — Encounter: Payer: Self-pay | Admitting: Family Medicine

## 2017-03-17 VITALS — BP 128/57 | HR 52 | Temp 99.0°F | Ht 66.0 in | Wt 178.0 lb

## 2017-03-17 DIAGNOSIS — R1032 Left lower quadrant pain: Secondary | ICD-10-CM

## 2017-03-17 MED ORDER — METRONIDAZOLE 500 MG PO TABS
500.0000 mg | ORAL_TABLET | Freq: Three times a day (TID) | ORAL | 0 refills | Status: DC
Start: 2017-03-17 — End: 2017-04-04

## 2017-03-17 MED ORDER — CIPROFLOXACIN HCL 500 MG PO TABS: 500.0000 mg | ORAL_TABLET | Freq: Two times a day (BID) | ORAL | 0 refills | Status: DC

## 2017-03-17 NOTE — Progress Notes (Signed)
   Subjective:    Patient ID: Mark Wu, male    DOB: August 26, 1926, 83 y.o.   MRN: [id number]  HPI Here for 4 days of bilateral lower abdominal pains. These are dull and not sharp. His BMs have been normal, including one this am. No urinary symptoms or fever. No nausea or vomiting.    Review of Systems  Constitutional: Negative.   Respiratory: Negative.   Cardiovascular: Negative.   Gastrointestinal: Positive for abdominal pain. Negative for abdominal distention, anal bleeding, blood in stool, constipation, diarrhea, nausea, rectal pain and vomiting.  Genitourinary: Negative.        Objective:   Physical Exam  Constitutional: He is oriented to person, place, and time. He appears well-developed and well-nourished.  Cardiovascular: Normal rate, regular rhythm, normal heart sounds and intact distal pulses.   Pulmonary/Chest: Effort normal and breath sounds normal. No respiratory distress. He has no wheezes. He has no rales.  Abdominal: Soft. Bowel sounds are normal. He exhibits no distension and no mass. There is no rebound and no guarding.  Mildly tender in the LLQ   Neurological: He is alert and oriented to person, place, and time.          Assessment & Plan:  Diverticulitis, treat with Cipro and Flagyl.  Alysia Penna, MD

## 2017-03-17 NOTE — Patient Instructions (Signed)
WE NOW OFFER   [facility] FAST TRACK!!!  SAME DAY Appointments for ACUTE CARE  Such as: Sprains, Injuries, cuts, abrasions, rashes, muscle pain, joint pain, back pain Colds, flu, sore throats, headache, allergies, cough, fever  Ear pain, sinus and eye infections Abdominal pain, nausea, vomiting, diarrhea, upset stomach Animal/insect bites  3 Easy Ways to Schedule: Walk-In Scheduling Call in scheduling Mychart Sign-up: [email or web address]         

## 2017-03-29 DIAGNOSIS — H35352 Cystoid macular degeneration, left eye: Secondary | ICD-10-CM | POA: Diagnosis not present

## 2017-03-29 DIAGNOSIS — H353221 Exudative age-related macular degeneration, left eye, with active choroidal neovascularization: Secondary | ICD-10-CM | POA: Diagnosis not present

## 2017-04-03 ENCOUNTER — Ambulatory Visit (INDEPENDENT_AMBULATORY_CARE_PROVIDER_SITE_OTHER): Payer: Medicare Other | Admitting: General Practice

## 2017-04-03 DIAGNOSIS — I4891 Unspecified atrial fibrillation: Secondary | ICD-10-CM

## 2017-04-03 DIAGNOSIS — Z5181 Encounter for therapeutic drug level monitoring: Secondary | ICD-10-CM

## 2017-04-03 LAB — POCT INR: INR: 2.2

## 2017-04-03 NOTE — Patient Instructions (Signed)
Pre visit review using our clinic review tool, if applicable. No additional management support is needed unless otherwise documented below in the visit note. 

## 2017-04-04 ENCOUNTER — Ambulatory Visit (INDEPENDENT_AMBULATORY_CARE_PROVIDER_SITE_OTHER): Payer: Medicare Other | Admitting: Family Medicine

## 2017-04-04 ENCOUNTER — Encounter: Payer: Self-pay | Admitting: Family Medicine

## 2017-04-04 VITALS — BP 110/68 | HR 68 | Temp 98.0°F | Wt 171.7 lb

## 2017-04-04 DIAGNOSIS — Z8042 Family history of malignant neoplasm of prostate: Secondary | ICD-10-CM | POA: Diagnosis not present

## 2017-04-04 DIAGNOSIS — R634 Abnormal weight loss: Secondary | ICD-10-CM | POA: Diagnosis not present

## 2017-04-04 DIAGNOSIS — K5900 Constipation, unspecified: Secondary | ICD-10-CM | POA: Diagnosis not present

## 2017-04-04 DIAGNOSIS — N320 Bladder-neck obstruction: Secondary | ICD-10-CM

## 2017-04-04 DIAGNOSIS — R5383 Other fatigue: Secondary | ICD-10-CM | POA: Diagnosis not present

## 2017-04-04 LAB — BASIC METABOLIC PANEL
BUN: 11 mg/dL (ref 6–23)
CALCIUM: 9.7 mg/dL (ref 8.4–10.5)
CO2: 27 mEq/L (ref 19–32)
Chloride: 104 mEq/L (ref 96–112)
Creatinine, Ser: 0.96 mg/dL (ref 0.40–1.50)
GFR: 78.45 mL/min (ref >60.00)
Glucose, Bld: 87 mg/dL (ref 70–99)
Potassium: 4.1 mEq/L (ref 3.5–5.1)
SODIUM: 138 meq/L (ref 135–145)

## 2017-04-04 LAB — CBC WITH DIFFERENTIAL/PLATELET
BASOS PCT: 0.4 % (ref 0.0–3.0)
Basophils Absolute: 0 10*3/uL (ref 0.0–0.1)
EOS ABS: 0.1 10*3/uL (ref 0.0–0.7)
Eosinophils Relative: 2.1 % (ref 0.0–5.0)
HCT: 36.9 % — ABNORMAL LOW (ref 39.0–52.0)
Hemoglobin: 11.8 g/dL — ABNORMAL LOW (ref 13.0–17.0)
LYMPHS PCT: 21 % (ref 12.0–46.0)
Lymphs Abs: 1.5 10*3/uL (ref 0.7–4.0)
MCHC: 31.9 g/dL (ref 30.0–36.0)
MCV: 75.3 fl — ABNORMAL LOW (ref 78.0–100.0)
MONO ABS: 0.8 10*3/uL (ref 0.1–1.0)
MONOS PCT: 12 % (ref 3.0–12.0)
NEUTROS ABS: 4.5 10*3/uL (ref 1.4–7.7)
NEUTROS PCT: 64.5 % (ref 43.0–77.0)
Platelets: 409 10*3/uL — ABNORMAL HIGH (ref 150.0–400.0)
RBC: 4.9 Mil/uL (ref 4.22–5.81)
RDW: 18.7 % — AB (ref 11.5–15.5)
WBC: 6.9 10*3/uL (ref 4.0–10.5)

## 2017-04-04 LAB — HEPATIC FUNCTION PANEL
ALK PHOS: 45 U/L (ref 39–117)
ALT: 12 U/L (ref 0–53)
AST: 17 U/L (ref 0–37)
Albumin: 4.1 g/dL (ref 3.5–5.2)
BILIRUBIN DIRECT: 0.2 mg/dL (ref 0.0–0.3)
Total Bilirubin: 0.9 mg/dL (ref 0.2–1.2)
Total Protein: 6.6 g/dL (ref 6.0–8.3)

## 2017-04-04 LAB — TSH: TSH: 1.06 u[IU]/mL (ref 0.35–4.50)

## 2017-04-04 LAB — PSA: PSA: 90.72 ng/mL — AB (ref 0.10–4.00)

## 2017-04-04 MED ORDER — TAMSULOSIN HCL 0.4 MG PO CAPS: 0.4000 mg | ORAL_CAPSULE | Freq: Every day | ORAL | 3 refills | Status: DC

## 2017-04-04 NOTE — Progress Notes (Signed)
Subjective:     Patient ID: Mark Wu, male   DOB: January 12, 1926, 83 y.o.   MRN: [id number]  HPI Patient has chronic problems including hypertension, atrial fibrillation, history of GERD, and macular degeneration.  Is seen with several nonspecific symptoms today. He was seen here June 1 with some nonspecific lower abdominal discomfort and was diagnosed with presumed diverticulitis but not clear diagnosed with this previously. He was placed on Cipro and Flagyl and pain did improve. He states his appetite has been somewhat diminished in recent weeks he has had increased fatigue. He has slow stream but no burning with urination. Increased nocturia usually 4-5 times per night.  No fever. No night sweats.  Also relates an increase constipation. Has taken occasional laxatives. He states his stools are somewhat "darker" than usual. Not any blood in his stools. No true melena. No abdominal pain. Sleep unchanged.  He is concerned because one of his brothers died of prostate cancer. He has not been screened for several years because of his age. He does have frequent slow stream especially at night. No anticholinergic medications.  Past Medical History:  Diagnosis Date  . Anal fissure 06/11/2009  . Atrial fibrillation ([place]) 06/11/2009  . HYPERTENSION 06/11/2009  . NECK MASS 10/01/2010   Past Surgical History:  Procedure Laterality Date  . CYSTOSCOPY  2000   hand    reports that he quit smoking about 25 years ago. His smoking use included Cigars. He quit after 50.00 years of use. He has never used smokeless tobacco. He reports that he does not drink alcohol or use drugs. family history is not on file. No Known Allergies   Review of Systems  Constitutional: Positive for fatigue.  Eyes: Negative for visual disturbance.  Respiratory: Negative for cough, chest tightness and shortness of breath.   Cardiovascular: Negative for chest pain, palpitations and leg swelling.  Gastrointestinal: Positive for  constipation. Negative for abdominal pain, blood in stool, diarrhea, nausea and vomiting.  Endocrine: Negative for polydipsia and polyuria.  Genitourinary: Positive for frequency. Negative for difficulty urinating, flank pain and hematuria.  Neurological: Negative for dizziness, syncope, weakness, light-headedness and headaches.       Objective:   Physical Exam  Constitutional: He appears well-developed and well-nourished.  Cardiovascular: Normal rate and regular rhythm.   Pulmonary/Chest: Effort normal and breath sounds normal. No respiratory distress. He has no wheezes. He has no rales.  Abdominal: Soft. Bowel sounds are normal. He exhibits no distension and no mass. There is no tenderness. There is no rebound and no guarding.  Genitourinary:  Genitourinary Comments: Rectal exam he has a large prostate and right lobe appears to be more firm to palpation vs the left. No rectal mass. Hemoccult negative stool  Musculoskeletal: He exhibits no edema.       Assessment:     #1 patient seen with multiple nonspecific symptoms including decreased appetite, mild weight loss, and increased fatigue. Etiology unclear. He complains of "darker stool "than usual but Hemoccult negative today  #2 frequent nocturia. He has enlarged prostate gland and does have some asymmetry to palpation with firm right lobe. High risk for prostate cancer    Plan:     -Obtain lab work including CBC, basic chemistries, TSH -Patient requesting PSA. We explained that we would not normally do this certainly not for screening purposes at his age but because of family history of prostate cancer, unexplained weight loss, nocturia, and asymmetric findings as above we'll go ahead and check PSA. If extremely  high consider consult with urology -Consider trial of Flomax 0.4 mg daily at bedtime -He is not a good candidate for repeat colonoscopy given his age. Last study was about 30 years ago.  [name] MD [facility] at [facility]

## 2017-04-04 NOTE — Patient Instructions (Signed)
We will call you with the labwork Go ahead and start the Flomax at night.

## 2017-04-05 ENCOUNTER — Telehealth: Payer: Self-pay | Admitting: Family Medicine

## 2017-04-05 NOTE — Telephone Encounter (Signed)
Pt has appointment with [facility]:  Dr Estill Dooms on 6/26 at 4:30 pm  Was supposed to let you know.

## 2017-04-05 NOTE — Telephone Encounter (Signed)
Thanks

## 2017-04-11 DIAGNOSIS — N402 Nodular prostate without lower urinary tract symptoms: Secondary | ICD-10-CM | POA: Diagnosis not present

## 2017-04-11 DIAGNOSIS — N433 Hydrocele, unspecified: Secondary | ICD-10-CM | POA: Diagnosis not present

## 2017-04-12 DIAGNOSIS — N402 Nodular prostate without lower urinary tract symptoms: Secondary | ICD-10-CM | POA: Diagnosis not present

## 2017-04-26 DIAGNOSIS — R972 Elevated prostate specific antigen [PSA]: Secondary | ICD-10-CM | POA: Diagnosis not present

## 2017-04-26 DIAGNOSIS — C61 Malignant neoplasm of prostate: Secondary | ICD-10-CM | POA: Diagnosis not present

## 2017-04-26 DIAGNOSIS — N402 Nodular prostate without lower urinary tract symptoms: Secondary | ICD-10-CM | POA: Diagnosis not present

## 2017-05-03 DIAGNOSIS — Z8546 Personal history of malignant neoplasm of prostate: Secondary | ICD-10-CM | POA: Diagnosis not present

## 2017-05-03 DIAGNOSIS — C61 Malignant neoplasm of prostate: Secondary | ICD-10-CM | POA: Diagnosis not present

## 2017-05-10 DIAGNOSIS — H353114 Nonexudative age-related macular degeneration, right eye, advanced atrophic with subfoveal involvement: Secondary | ICD-10-CM | POA: Diagnosis not present

## 2017-05-10 DIAGNOSIS — H353221 Exudative age-related macular degeneration, left eye, with active choroidal neovascularization: Secondary | ICD-10-CM | POA: Diagnosis not present

## 2017-05-10 DIAGNOSIS — H353123 Nonexudative age-related macular degeneration, left eye, advanced atrophic without subfoveal involvement: Secondary | ICD-10-CM | POA: Diagnosis not present

## 2017-05-10 DIAGNOSIS — H02005 Unspecified entropion of left lower eyelid: Secondary | ICD-10-CM | POA: Diagnosis not present

## 2017-05-10 DIAGNOSIS — H353212 Exudative age-related macular degeneration, right eye, with inactive choroidal neovascularization: Secondary | ICD-10-CM | POA: Diagnosis not present

## 2017-05-15 ENCOUNTER — Ambulatory Visit: Payer: Medicare Other | Admitting: Family Medicine

## 2017-05-15 ENCOUNTER — Ambulatory Visit: Payer: Medicare Other

## 2017-05-16 DIAGNOSIS — Z961 Presence of intraocular lens: Secondary | ICD-10-CM | POA: Diagnosis not present

## 2017-05-16 DIAGNOSIS — H04123 Dry eye syndrome of bilateral lacrimal glands: Secondary | ICD-10-CM | POA: Diagnosis not present

## 2017-05-16 DIAGNOSIS — H02035 Senile entropion of left lower eyelid: Secondary | ICD-10-CM | POA: Diagnosis not present

## 2017-05-19 DIAGNOSIS — C61 Malignant neoplasm of prostate: Secondary | ICD-10-CM | POA: Diagnosis not present

## 2017-05-22 ENCOUNTER — Ambulatory Visit: Payer: Medicare Other

## 2017-05-22 ENCOUNTER — Ambulatory Visit: Payer: Medicare Other | Admitting: Family Medicine

## 2017-05-22 DIAGNOSIS — H02035 Senile entropion of left lower eyelid: Secondary | ICD-10-CM | POA: Diagnosis not present

## 2017-05-29 ENCOUNTER — Ambulatory Visit (INDEPENDENT_AMBULATORY_CARE_PROVIDER_SITE_OTHER): Payer: Medicare Other | Admitting: Family Medicine

## 2017-05-29 ENCOUNTER — Ambulatory Visit (INDEPENDENT_AMBULATORY_CARE_PROVIDER_SITE_OTHER): Payer: Medicare Other | Admitting: General Practice

## 2017-05-29 ENCOUNTER — Encounter: Payer: Self-pay | Admitting: Family Medicine

## 2017-05-29 VITALS — BP 130/70 | HR 66 | Temp 97.8°F | Wt 173.7 lb

## 2017-05-29 DIAGNOSIS — I10 Essential (primary) hypertension: Secondary | ICD-10-CM | POA: Diagnosis not present

## 2017-05-29 DIAGNOSIS — I4891 Unspecified atrial fibrillation: Secondary | ICD-10-CM | POA: Diagnosis not present

## 2017-05-29 DIAGNOSIS — C61 Malignant neoplasm of prostate: Secondary | ICD-10-CM | POA: Diagnosis not present

## 2017-05-29 DIAGNOSIS — Z5181 Encounter for therapeutic drug level monitoring: Secondary | ICD-10-CM | POA: Diagnosis not present

## 2017-05-29 LAB — POCT INR: INR: 1.5

## 2017-05-29 NOTE — Progress Notes (Signed)
Subjective:     Patient ID: Mark Wu, male   DOB: [birth year], 83 y.o.   MRN: [id number]  HPI Patient seen for medical follow-up. He was seen back in June with increased malaise and had requested PSA at that point because of positive family history of prostate cancer. His PSA came back 90. He had some slow stream. We started tamsulosin in his stream has improved. He went to [facility] and prostate biopsy confirmed adenocarcinoma the prostate. He is now getting hormonal therapy and feels well overall. Fortunately, his bone scan showed that his cancer was confined to the prostate gland. Urine flow is strong. He has follow-up next month for repeat PSA there.  Long-standing history of atrial fibrillation. He is back on Coumadin. Also remains on Toprol and amlodipine. His blood pressures been stable. No chest pains. No dizziness. No falls. Very stable with gait. Appetite is improved  Past Medical History:  Diagnosis Date  . Anal fissure 06/11/2009  . Atrial fibrillation ([place]) 06/11/2009  . HYPERTENSION 06/11/2009  . NECK MASS 10/01/2010   Past Surgical History:  Procedure Laterality Date  . CYSTOSCOPY  2000   hand    reports that he quit smoking about 25 years ago. His smoking use included Cigars. He quit after 50.00 years of use. He has never used smokeless tobacco. He reports that he does not drink alcohol or use drugs. family history is not on file. No Known Allergies   Review of Systems  Constitutional: Negative for chills, fatigue and fever.  Eyes: Negative for visual disturbance.  Respiratory: Negative for cough, chest tightness and shortness of breath.   Cardiovascular: Negative for chest pain, palpitations and leg swelling.  Endocrine: Negative for polydipsia and polyuria.  Genitourinary: Negative for difficulty urinating and hematuria.  Neurological: Negative for dizziness, syncope, weakness, light-headedness and headaches.  Hematological: Does not bruise/bleed easily.   Psychiatric/Behavioral: Negative for confusion.       Objective:   Physical Exam  Constitutional: He is oriented to person, place, and time. He appears well-developed and well-nourished.  HENT:  Right Ear: External ear normal.  Left Ear: External ear normal.  Mouth/Throat: Oropharynx is clear and moist.  Eyes: Pupils are equal, round, and reactive to light.  Neck: Neck supple. No thyromegaly present.  Cardiovascular: Normal rate.   Irregular rhythm but rate controlled  Pulmonary/Chest: Effort normal and breath sounds normal. No respiratory distress. He has no wheezes. He has no rales.  Musculoskeletal: He exhibits no edema.  Neurological: He is alert and oriented to person, place, and time.       Assessment:     #1 atrial fibrillation. Rate controlled and on Coumadin  #2 hypertension stable and at goal  #3 prostate cancer recently diagnosed and currently on hormonal therapy per [place]:     -Continue current medications -Check INR today through Coumadin clinic -Continue close follow-up [facility] regarding his prostate cancer -Reminder for flu vaccine this fall  [name] MD [facility] at [facility]

## 2017-05-29 NOTE — Patient Instructions (Signed)
Pre visit review using our clinic review tool, if applicable. No additional management support is needed unless otherwise documented below in the visit note. 

## 2017-06-21 DIAGNOSIS — C61 Malignant neoplasm of prostate: Secondary | ICD-10-CM | POA: Diagnosis not present

## 2017-06-22 ENCOUNTER — Encounter: Payer: Self-pay | Admitting: Radiation Oncology

## 2017-06-28 DIAGNOSIS — H353212 Exudative age-related macular degeneration, right eye, with inactive choroidal neovascularization: Secondary | ICD-10-CM | POA: Diagnosis not present

## 2017-06-28 DIAGNOSIS — H3562 Retinal hemorrhage, left eye: Secondary | ICD-10-CM | POA: Diagnosis not present

## 2017-06-28 DIAGNOSIS — H353221 Exudative age-related macular degeneration, left eye, with active choroidal neovascularization: Secondary | ICD-10-CM | POA: Diagnosis not present

## 2017-06-28 DIAGNOSIS — H35052 Retinal neovascularization, unspecified, left eye: Secondary | ICD-10-CM | POA: Diagnosis not present

## 2017-06-28 DIAGNOSIS — H35352 Cystoid macular degeneration, left eye: Secondary | ICD-10-CM | POA: Diagnosis not present

## 2017-07-03 ENCOUNTER — Ambulatory Visit
Admission: RE | Admit: 2017-07-03 | Discharge: 2017-07-03 | Disposition: A | Discharge status: Home / Self Care | Payer: Medicare Other | Source: Ambulatory Visit | Attending: Radiation Oncology | Admitting: Radiation Oncology

## 2017-07-03 ENCOUNTER — Ambulatory Visit: Payer: Medicare Other

## 2017-07-03 ENCOUNTER — Telehealth: Payer: Self-pay | Admitting: Emergency Medicine

## 2017-07-03 DIAGNOSIS — C61 Malignant neoplasm of prostate: Secondary | ICD-10-CM | POA: Insufficient documentation

## 2017-07-03 DIAGNOSIS — Z8042 Family history of malignant neoplasm of prostate: Secondary | ICD-10-CM | POA: Insufficient documentation

## 2017-07-03 DIAGNOSIS — I10 Essential (primary) hypertension: Secondary | ICD-10-CM | POA: Insufficient documentation

## 2017-07-03 DIAGNOSIS — Z7901 Long term (current) use of anticoagulants: Secondary | ICD-10-CM | POA: Insufficient documentation

## 2017-07-03 DIAGNOSIS — Z87891 Personal history of nicotine dependence: Secondary | ICD-10-CM | POA: Insufficient documentation

## 2017-07-03 DIAGNOSIS — Z79899 Other long term (current) drug therapy: Secondary | ICD-10-CM | POA: Insufficient documentation

## 2017-07-03 DIAGNOSIS — I4891 Unspecified atrial fibrillation: Secondary | ICD-10-CM | POA: Insufficient documentation

## 2017-07-10 ENCOUNTER — Ambulatory Visit (INDEPENDENT_AMBULATORY_CARE_PROVIDER_SITE_OTHER): Payer: Medicare Other | Admitting: General Practice

## 2017-07-10 DIAGNOSIS — I4891 Unspecified atrial fibrillation: Secondary | ICD-10-CM

## 2017-07-10 DIAGNOSIS — Z7901 Long term (current) use of anticoagulants: Secondary | ICD-10-CM | POA: Diagnosis not present

## 2017-07-10 DIAGNOSIS — Z5181 Encounter for therapeutic drug level monitoring: Secondary | ICD-10-CM | POA: Diagnosis not present

## 2017-07-10 LAB — POCT INR: INR: 1.8

## 2017-07-10 NOTE — Patient Instructions (Signed)
Pre visit review using our clinic review tool, if applicable. No additional management support is needed unless otherwise documented below in the visit note. 

## 2017-07-17 ENCOUNTER — Ambulatory Visit: Payer: Medicare Other

## 2017-07-17 ENCOUNTER — Ambulatory Visit: Admission: RE | Admit: 2017-07-17 | Payer: Medicare Other | Source: Ambulatory Visit | Admitting: Radiation Oncology

## 2017-07-17 ENCOUNTER — Telehealth: Payer: Self-pay | Admitting: Radiation Oncology

## 2017-07-17 NOTE — Telephone Encounter (Signed)
Patient has not shown for 1230 appointment. Phoned patient to inquire. Patient reports he called Friday and cancelled today's appointment. Reports he is uncertain about who he spoke with but, knows "they were in the front office of radiation oncology." Patient request to be rescheduled after the next 3-4 weeks. Patient explains his wife is going to rehab following knee replacement surgery and he needs to tend to her first. Informed Dr. Tammi Klippel and Shona Simpson, PA-C of these findings.

## 2017-07-19 ENCOUNTER — Telehealth: Payer: Self-pay | Admitting: Radiation Oncology

## 2017-07-19 NOTE — Telephone Encounter (Signed)
Did in error

## 2017-07-19 NOTE — Telephone Encounter (Signed)
Melanie, Please call this patient to reschedule consult and notify me regarding appointment time.  Thanks! MM

## 2017-07-31 ENCOUNTER — Ambulatory Visit (INDEPENDENT_AMBULATORY_CARE_PROVIDER_SITE_OTHER): Payer: Medicare Other | Admitting: Family Medicine

## 2017-07-31 ENCOUNTER — Encounter: Payer: Self-pay | Admitting: Family Medicine

## 2017-07-31 VITALS — BP 120/60 | HR 73 | Temp 97.9°F | Wt 179.4 lb

## 2017-07-31 DIAGNOSIS — Z23 Encounter for immunization: Secondary | ICD-10-CM

## 2017-07-31 DIAGNOSIS — K5900 Constipation, unspecified: Secondary | ICD-10-CM | POA: Diagnosis not present

## 2017-07-31 NOTE — Progress Notes (Signed)
Subjective:     Patient ID: Mark Wu, male   DOB: 10/28/1925, 83 y.o.   MRN: [id number]  HPI Patient seen with some constipation issues. He states about every few days he has been recently taking some type of tablet laxative. He frequently has to strain for stools. Wife states he drinks lots of tea but very little water. Stays fairly active physically. No recent bloody stools. Patient is on Coumadin for history of atrial fibrillation. Recent TSH in June which was normal. Occasional abdominal fullness but no localizing abdominal pain. No history of impaction. Does not take any anti-cholinergic medications  Past Medical History:  Diagnosis Date  . Anal fissure 06/11/2009  . Atrial fibrillation ([place]) 06/11/2009  . HYPERTENSION 06/11/2009  . NECK MASS 10/01/2010   Past Surgical History:  Procedure Laterality Date  . CYSTOSCOPY  2000   hand    reports that he quit smoking about 25 years ago. His smoking use included Cigars. He quit after 50.00 years of use. He has never used smokeless tobacco. He reports that he does not drink alcohol or use drugs. family history is not on file. No Known Allergies   Review of Systems  Constitutional: Negative for appetite change and unexpected weight change.  Cardiovascular: Negative for chest pain.  Gastrointestinal: Positive for constipation. Negative for abdominal pain, diarrhea, nausea and vomiting.       Objective:   Physical Exam  Constitutional: He appears well-developed and well-nourished.  Cardiovascular: Normal rate.   Pulmonary/Chest: Effort normal and breath sounds normal. No respiratory distress. He has no wheezes. He has no rales.  Abdominal: Soft. Bowel sounds are normal.  Umbilical hernia which is soft and nontender. Otherwise no mass. No other tenderness noted  Musculoskeletal: He exhibits no edema.       Assessment:     Constipation. No history of impaction. Recent TSH normal.    Plan:     -Discussed lifestyle  modification with increased fluids, at least 25 g fiber per day, consider stool softener -Consider over-the-counter MiraLAX as needed but avoid regular use of stimulant laxatives -Flu vaccine given  [name] MD [place] [facility] [facility] at [facility]

## 2017-07-31 NOTE — Addendum Note (Signed)
Addended by: Westley Hummer B on: 07/31/2017 05:52 PM   Modules accepted: Orders

## 2017-07-31 NOTE — Patient Instructions (Signed)
Constipation, Adult Constipation is when a person has fewer bowel movements in a week than normal, has difficulty having a bowel movement, or has stools that are dry, hard, or larger than normal. Constipation may be caused by an underlying condition. It may become worse with age if a person takes certain medicines and does not take in enough fluids. Follow these instructions at home: Eating and drinking   Eat foods that have a lot of fiber, such as fresh fruits and vegetables, whole grains, and beans.  Limit foods that are high in fat, low in fiber, or overly processed, such as french fries, hamburgers, cookies, candies, and soda.  Drink enough fluid to keep your urine clear or pale yellow. General instructions  Exercise regularly or as told by your health care provider.  Go to the restroom when you have the urge to go. Do not hold it in.  Take over-the-counter and prescription medicines only as told by your health care provider. These include any fiber supplements.  Practice pelvic floor retraining exercises, such as deep breathing while relaxing the lower abdomen and pelvic floor relaxation during bowel movements.  Watch your condition for any changes.  Keep all follow-up visits as told by your health care provider. This is important. Contact a health care provider if:  You have pain that gets worse.  You have a fever.  You do not have a bowel movement after 4 days.  You vomit.  You are not hungry.  You lose weight.  You are bleeding from the anus.  You have thin, pencil-like stools. Get help right away if:  You have a fever and your symptoms suddenly get worse.  You leak stool or have blood in your stool.  Your abdomen is bloated.  You have severe pain in your abdomen.  You feel dizzy or you faint. This information is not intended to replace advice given to you by your health care provider. Make sure you discuss any questions you have with your health care  provider. Document Released: 07/01/2004 Document Revised: 04/22/2016 Document Reviewed: 03/23/2016 Elsevier Interactive Patient Education  2017 [employer].  Drink more water Consider Miralax as needed for severe constipation. Avoid other laxatives Try to get at least 25 grams of fiber per day.

## 2017-08-07 ENCOUNTER — Ambulatory Visit (INDEPENDENT_AMBULATORY_CARE_PROVIDER_SITE_OTHER): Payer: Medicare Other | Admitting: General Practice

## 2017-08-07 DIAGNOSIS — I4891 Unspecified atrial fibrillation: Secondary | ICD-10-CM | POA: Diagnosis not present

## 2017-08-07 DIAGNOSIS — Z7901 Long term (current) use of anticoagulants: Secondary | ICD-10-CM

## 2017-08-07 LAB — POCT INR: INR: 1.8

## 2017-08-07 NOTE — Patient Instructions (Signed)
Pre visit review using our clinic review tool, if applicable. No additional management support is needed unless otherwise documented below in the visit note. 

## 2017-08-16 DIAGNOSIS — H35052 Retinal neovascularization, unspecified, left eye: Secondary | ICD-10-CM | POA: Diagnosis not present

## 2017-08-16 DIAGNOSIS — H35352 Cystoid macular degeneration, left eye: Secondary | ICD-10-CM | POA: Diagnosis not present

## 2017-08-16 DIAGNOSIS — H353123 Nonexudative age-related macular degeneration, left eye, advanced atrophic without subfoveal involvement: Secondary | ICD-10-CM | POA: Diagnosis not present

## 2017-08-16 DIAGNOSIS — H353212 Exudative age-related macular degeneration, right eye, with inactive choroidal neovascularization: Secondary | ICD-10-CM | POA: Diagnosis not present

## 2017-08-16 DIAGNOSIS — H353221 Exudative age-related macular degeneration, left eye, with active choroidal neovascularization: Secondary | ICD-10-CM | POA: Diagnosis not present

## 2017-08-22 ENCOUNTER — Encounter: Payer: Self-pay | Admitting: Radiation Oncology

## 2017-09-04 ENCOUNTER — Other Ambulatory Visit: Payer: Self-pay | Admitting: *Deleted

## 2017-09-04 ENCOUNTER — Ambulatory Visit (INDEPENDENT_AMBULATORY_CARE_PROVIDER_SITE_OTHER): Payer: Medicare Other | Admitting: General Practice

## 2017-09-04 DIAGNOSIS — I4891 Unspecified atrial fibrillation: Secondary | ICD-10-CM | POA: Diagnosis not present

## 2017-09-04 DIAGNOSIS — Z7901 Long term (current) use of anticoagulants: Secondary | ICD-10-CM | POA: Diagnosis not present

## 2017-09-04 LAB — POCT INR: INR: 2.1

## 2017-09-04 MED ORDER — TAMSULOSIN HCL 0.4 MG PO CAPS: 0.4000 mg | ORAL_CAPSULE | Freq: Every day | ORAL | 1 refills | Status: DC

## 2017-09-04 NOTE — Patient Instructions (Addendum)
Pre visit review using our clinic review tool, if applicable. No additional management support is needed unless otherwise documented below in the visit note.  Continue to take 1 tablet daily except 2 tablets every Monday. Re-check in 4 weeks.  

## 2017-09-11 ENCOUNTER — Encounter: Payer: Self-pay | Admitting: Radiation Oncology

## 2017-09-11 ENCOUNTER — Other Ambulatory Visit: Payer: Self-pay

## 2017-09-11 ENCOUNTER — Ambulatory Visit
Admission: RE | Admit: 2017-09-11 | Discharge: 2017-09-11 | Disposition: A | Discharge status: Home / Self Care | Payer: Medicare Other | Source: Ambulatory Visit | Attending: Radiation Oncology | Admitting: Radiation Oncology

## 2017-09-11 ENCOUNTER — Encounter: Payer: Self-pay | Admitting: Medical Oncology

## 2017-09-11 VITALS — BP 147/61 | HR 53 | Resp 16 | Ht 69.0 in | Wt 184.4 lb

## 2017-09-11 DIAGNOSIS — I4891 Unspecified atrial fibrillation: Secondary | ICD-10-CM | POA: Diagnosis not present

## 2017-09-11 DIAGNOSIS — Z8042 Family history of malignant neoplasm of prostate: Secondary | ICD-10-CM | POA: Diagnosis not present

## 2017-09-11 DIAGNOSIS — R972 Elevated prostate specific antigen [PSA]: Secondary | ICD-10-CM | POA: Diagnosis not present

## 2017-09-11 DIAGNOSIS — C61 Malignant neoplasm of prostate: Secondary | ICD-10-CM | POA: Diagnosis not present

## 2017-09-11 DIAGNOSIS — Z79818 Long term (current) use of other agents affecting estrogen receptors and estrogen levels: Secondary | ICD-10-CM | POA: Diagnosis not present

## 2017-09-11 DIAGNOSIS — Z79899 Other long term (current) drug therapy: Secondary | ICD-10-CM | POA: Diagnosis not present

## 2017-09-11 DIAGNOSIS — Z7901 Long term (current) use of anticoagulants: Secondary | ICD-10-CM | POA: Diagnosis not present

## 2017-09-11 DIAGNOSIS — I10 Essential (primary) hypertension: Secondary | ICD-10-CM | POA: Diagnosis not present

## 2017-09-11 DIAGNOSIS — Z87891 Personal history of nicotine dependence: Secondary | ICD-10-CM | POA: Diagnosis not present

## 2017-09-11 HISTORY — DX: Malignant neoplasm of prostate: C61

## 2017-09-11 NOTE — Progress Notes (Signed)
Radiation Oncology         [phone] ________________________________  Initial Outpatient Consultation  Name: Mark Wu MRN: [id number]  Date: 09/11/2017  DOB: 1926/07/22  SW:FUXNATFTD, Mark Sierras, MD  [name], MD   REFERRING PHYSICIAN: [name], MD  DIAGNOSIS: 83 y.o. gentleman with Stage T2b adenocarcinoma of the prostate with Gleason Score of 4+4, and PSA of 90.7    ICD-10-CM   1. Malignant neoplasm of prostate ([place]) C61   2. Prostate cancer [facility]) C61     HISTORY OF PRESENT ILLNESS: Mark Wu is a 83 y.o. male with a diagnosis of prostate cancer. He had previously not been screened for several years due to his age. He presented to his primary care physician, Dr. [name], in January 2018 with frequent nocturia. Due to his family history of prostate cancer, unexplained weight loss, nocturia, and asymmetric rectal exam findings, his PSA was checked and was elevated at 90.7. Accordingly, he was referred for evaluation in urology by Dr. Estill Dooms on 04/11/2017, where a digital rectal examination was performed at that time revealing diffuse irregularity in the prostate, right greater than left, with a firm nodule on the right side in the mid gland which encompassed up towards the base as well as the apex.  The patient proceeded to transrectal ultrasound with 4 biopsies of the prostate on 04/26/2017.  The prostate volume measured 79.16 cc.  Out of 4 core biopsies, 3 were positive.  The maximum Gleason score was 4+4, and this was seen in the right mid, right lateral apex, and right lateral mid, with focal perineural involvement present. CT and bone scans were performed and showed no evidence of metastatic disease. Due to his increasing urinary symptoms, high PSA, and rectal exam, he has received Lupron on 06/21/2017 and had previously received two injections of Firmagon. He comes today to discuss options of radiotherapy.  PREVIOUS RADIATION THERAPY: No  PAST MEDICAL  HISTORY:  Past Medical History:  Diagnosis Date  . Anal fissure 06/11/2009  . Atrial fibrillation ([place]) 06/11/2009  . HYPERTENSION 06/11/2009  . NECK MASS 10/01/2010  . Prostate cancer ([place])       PAST SURGICAL HISTORY: Past Surgical History:  Procedure Laterality Date  . CYSTOSCOPY  2000   hand  . PROSTATE BIOPSY      FAMILY HISTORY:  Family History  Problem Relation Age of Onset  . Cancer Sister 55       unknown type  . Cancer Brother        prostate/seed implant with [name]/died 02-06-2014 of heart attack    SOCIAL HISTORY:  Social History   Socioeconomic History  . Marital status: Married    Spouse name: Not on file  . Number of children: Not on file  . Years of education: Not on file  . Highest education level: Not on file  Social Needs  . Financial resource strain: Not on file  . Food insecurity - worry: Not on file  . Food insecurity - inability: Not on file  . Transportation needs - medical: Not on file  . Transportation needs - non-medical: Not on file  Occupational History  . Occupation: retired    Comment: retired from [employer] in [place] Use  . Smoking status: Former Smoker    Years: 50.00    Types: Cigars    Last attempt to quit: 08/08/1991    Years since quitting: 26.1  . Smokeless tobacco: Never Used  Substance and Sexual Activity  .  Alcohol use: No  . Drug use: No  . Sexual activity: No  Other Topics Concern  . Not on file  Social History Narrative   Resides in [place] (near [place], [place])  The patient is married and is accompanied by his wife.  ALLERGIES: Patient has no known allergies.  MEDICATIONS:  Current Outpatient Medications  Medication Sig Dispense Refill  . amLODipine (NORVASC) 5 MG tablet     . clotrimazole-betamethasone (LOTRISONE) cream Apply topically as needed.    . metoprolol succinate (TOPROL XL) 50 MG 24 hr tablet     . tamsulosin (FLOMAX) 0.4 MG CAPS capsule Take 1 capsule (0.4 mg total) daily by mouth. 90 capsule 1    . warfarin (COUMADIN) 2.5 MG tablet      No current facility-administered medications for this encounter.     REVIEW OF SYSTEMS:  On review of systems, the patient reports that he is doing well overall. He denies any chest pain, shortness of breath, cough, fevers, chills, night sweats, or unintended weight changes. He denies any bowel disturbances, and denies abdominal pain, nausea or vomiting. He denies any new musculoskeletal or joint aches or pains. His IPSS was 6, indicating mild urinary symptoms. He denies dysuria, hematuria, leakage or incontinence. He is not currently sexually active. A complete review of systems is obtained and is otherwise negative.    PHYSICAL EXAM:  Wt Readings from Last 3 Encounters:  09/11/17 184 lb 6.4 oz (83.6 kg)  07/31/17 179 lb 6.4 oz (81.4 kg)  05/29/17 173 lb 11.2 oz (78.8 kg)   Temp Readings from Last 3 Encounters:  07/31/17 97.9 F (36.6 C) (Oral)  05/29/17 97.8 F (36.6 C) (Oral)  04/04/17 98 F (36.7 C) (Oral)   BP Readings from Last 3 Encounters:  09/11/17 (!) 147/61  07/31/17 120/60  05/29/17 130/70   Pulse Readings from Last 3 Encounters:  09/11/17 (!) 53  07/31/17 73  05/29/17 66   Pain Assessment Pain Score: 0-No pain/10  In general this is a well appearing Caucasian man in no acute distress. He's alert and oriented x4 and appropriate throughout the examination. Cardiopulmonary assessment is negative for acute distress and he exhibits normal effort.    KPS = 100  100 - Normal; no complaints; no evidence of disease. 90   - Able to carry on normal activity; minor signs or symptoms of disease. 80   - Normal activity with effort; some signs or symptoms of disease. 69   - Cares for self; unable to carry on normal activity or to do active work. 60   - Requires occasional assistance, but is able to care for most of his personal needs. 50   - Requires considerable assistance and frequent medical care. 8   - Disabled; requires  special care and assistance. 27   - Severely disabled; hospital admission is indicated although death not imminent. 55   - Very sick; hospital admission necessary; active supportive treatment necessary. 10   - Moribund; fatal processes progressing rapidly. 0     - Dead  Karnofsky DA, Abelmann Pepin, Craver LS and Burchenal [facility] [phone]) The use of the nitrogen mustards in the palliative treatment of carcinoma: with particular reference to bronchogenic carcinoma Cancer 1 634-56  LABORATORY DATA:  Lab Results  Component Value Date   WBC 6.9 04/04/2017   HGB 11.8 (L) 04/04/2017   HCT 36.9 (L) 04/04/2017   MCV 75.3 (L) 04/04/2017   PLT 409.0 (H) 04/04/2017   Lab Results  Component  Value Date   NA 138 04/04/2017   K 4.1 04/04/2017   CL 104 04/04/2017   CO2 27 04/04/2017   Lab Results  Component Value Date   ALT 12 04/04/2017   AST 17 04/04/2017   ALKPHOS 45 04/04/2017   BILITOT 0.9 04/04/2017     RADIOGRAPHY: No results found.    IMPRESSION/PLAN: 1. 83 y.o. gentleman with Stage T2b adenocarcinoma of the prostate with Gleason Score of 4+4, and PSA of 90.7.  We discussed the patient's workup and outlines the nature of prostate cancer in this setting. The patient's T stage, Gleason's score, and PSA put him into the high risk group. Accordingly, he is eligible for 8 weeks of external radiation. We discussed the available radiation techniques, and focused on the details and logistics and delivery. We discussed and outlined the risks, benefits, short and long-term effects associated with radiotherapy.  At the end of the conversation the patient is not interested in radiotherapy at this time. He has received a Lupron injection, and we discussed the role of ADT in the treatment of high-risk prostate cancer and outlined the associated side effects that could be expected with this therapy. We will share our discussion with Dr. Estill Dooms and will consider radiotherapy at a later time if his PSA continues to  rise and he remains in good health. He was encouraged to think about his treatment options in order to make an informed decision and will call or return if he has any additional questions and/or wishes to proceed with radiotherapy.   We spent 60 minutes face to face with the patient and more than 50% of that time was spent in counseling and/or coordination of care.     Carola Rhine, PAC    Tyler Pita, MD  [facility] Direct Dial: [phone]  Fax: [phone] [email or web address]  Skype  LinkedIn    This document serves as a record of services personally performed by Tyler Pita, MD and Shona Simpson, PA-C. It was created on their behalf by Rae Lips, a trained medical scribe. The creation of this record is based on the scribe's personal observations and the providers' statements to them. This document has been checked and approved by the attending providers.

## 2017-09-11 NOTE — Progress Notes (Signed)
See progress note under physician encounter. 

## 2017-09-11 NOTE — Progress Notes (Signed)
Introduced myself to Mark Wu and his wife Estill Bamberg as the prostate nurse navigator and my role. He states that he started hormone therapy and is not sure he wants to do radiation treatments due to his age. He is 76 but very healthy and in great shape. I encouraged him to listen and learn about his treatment options so he can make an informed decision. If he choose not to do radiation we will respect his decision. He has had to reschedule his consult with Dr. Tammi Klippel due to his wife having surgery.

## 2017-09-11 NOTE — Progress Notes (Signed)
GU Location of Tumor / Histology: prostatic adenocarcinoma  If Prostate Cancer, Gleason Score is (4 + 4) and PSA is (90). Prostate volume: 79.16 cc.   Mark Wu reports he retired from [employer] in 1969. Patient explains he didn't see a provider until the age of 62. Reports he was followed by Dr. Jeffie Pollock for years but decided he no longer wished to be seen by him. Explains that in October 2017 his PCP, Dr. Jarold Song, recommended further evaluation of his PSA and referred him to Dr. Estill Dooms.   Biopsies of prostate (if applicable) revealed:    Past/Anticipated interventions by urology, if any: Firmagon injection done 07/19/17, Lupron 45 every six months first injection 06/21/17, scheduled for follow up with [facility] 12/21/17, referred to Dr. Tammi Klippel to discuss radiation treatment options  Past/Anticipated interventions by medical oncology, if any: no  Weight changes, if any: no  Bowel/Bladder complaints, if any: IPSS 6. Denies dysuria, hematuria, urinary leakage or urinary incontinence.   Nausea/Vomiting, if any: no  Pain issues, if any:  no  SAFETY ISSUES:  Prior radiation? no  Pacemaker/ICD? no  Possible current pregnancy? no  Is the patient on methotrexate? no  Current Complaints / other details:  83 year old male. Married. NKDA. Hx of A fib. Brother had seed implant but, passed three years ago of massive heart attack.

## 2017-09-17 ENCOUNTER — Other Ambulatory Visit: Payer: Self-pay | Admitting: Family Medicine

## 2017-10-02 ENCOUNTER — Ambulatory Visit: Payer: Medicare Other

## 2017-10-02 ENCOUNTER — Ambulatory Visit (INDEPENDENT_AMBULATORY_CARE_PROVIDER_SITE_OTHER): Payer: Medicare Other | Admitting: General Practice

## 2017-10-02 DIAGNOSIS — Z7901 Long term (current) use of anticoagulants: Secondary | ICD-10-CM

## 2017-10-02 DIAGNOSIS — I4891 Unspecified atrial fibrillation: Secondary | ICD-10-CM

## 2017-10-02 LAB — POCT INR: INR: 2.3

## 2017-10-02 NOTE — Patient Instructions (Addendum)
Pre visit review using our clinic review tool, if applicable. No additional management support is needed unless otherwise documented below in the visit note.  Continue to take 1 tablet daily except 2 tablets every Monday. Re-check in 4 weeks.  

## 2017-10-09 ENCOUNTER — Other Ambulatory Visit: Payer: Self-pay | Admitting: *Deleted

## 2017-10-09 MED ORDER — WARFARIN SODIUM 2.5 MG PO TABS: ORAL_TABLET | ORAL | 1 refills | Status: DC

## 2017-10-09 NOTE — Telephone Encounter (Signed)
Patient was in the office today with his wife and requested a refill on Coumadin.  Message sent to [facility].

## 2017-10-12 DIAGNOSIS — H353221 Exudative age-related macular degeneration, left eye, with active choroidal neovascularization: Secondary | ICD-10-CM | POA: Diagnosis not present

## 2017-10-12 DIAGNOSIS — H4052X2 Glaucoma secondary to other eye disorders, left eye, moderate stage: Secondary | ICD-10-CM | POA: Diagnosis not present

## 2017-10-12 DIAGNOSIS — H353212 Exudative age-related macular degeneration, right eye, with inactive choroidal neovascularization: Secondary | ICD-10-CM | POA: Diagnosis not present

## 2017-10-12 DIAGNOSIS — H35352 Cystoid macular degeneration, left eye: Secondary | ICD-10-CM | POA: Diagnosis not present

## 2017-10-16 ENCOUNTER — Telehealth: Payer: Self-pay | Admitting: Family Medicine

## 2017-10-16 NOTE — Telephone Encounter (Signed)
He needs the brand name... Sorry I had a typo

## 2017-10-16 NOTE — Telephone Encounter (Signed)
Pt requesting brand name of Coumadin 2.5mg .

## 2017-10-16 NOTE — Telephone Encounter (Signed)
Copied from [place] [phone]. Topic: Quick Communication - See Telephone Encounter >> Oct 16, 2017  1:35 PM Vernona Rieger wrote: CRM for notification. See Telephone encounter for:   10/16/17.  Pt states that [facility] sent him COUMADIN 2.5mg , he said he needs the brand new only sent to him. Please advise. [phone]

## 2017-10-18 ENCOUNTER — Ambulatory Visit: Payer: Medicare Other | Admitting: Family Medicine

## 2017-10-18 ENCOUNTER — Encounter: Payer: Self-pay | Admitting: Family Medicine

## 2017-10-18 ENCOUNTER — Other Ambulatory Visit: Payer: Self-pay | Admitting: General Practice

## 2017-10-18 ENCOUNTER — Ambulatory Visit (INDEPENDENT_AMBULATORY_CARE_PROVIDER_SITE_OTHER): Payer: Medicare Other | Admitting: Family Medicine

## 2017-10-18 VITALS — BP 130/60 | HR 58 | Temp 97.8°F | Wt 189.0 lb

## 2017-10-18 DIAGNOSIS — I4891 Unspecified atrial fibrillation: Secondary | ICD-10-CM

## 2017-10-18 DIAGNOSIS — I10 Essential (primary) hypertension: Secondary | ICD-10-CM | POA: Diagnosis not present

## 2017-10-18 DIAGNOSIS — R202 Paresthesia of skin: Secondary | ICD-10-CM | POA: Diagnosis not present

## 2017-10-18 LAB — VITAMIN B12: Vitamin B-12: 372 pg/mL (ref 211–911)

## 2017-10-18 MED ORDER — WARFARIN SODIUM 2.5 MG PO TABS: ORAL_TABLET | ORAL | 1 refills | Status: DC

## 2017-10-18 NOTE — Progress Notes (Signed)
Subjective:     Patient ID: Mark Wu, male   DOB: 06/22/1926, 83 y.o.   MRN: [id number]  HPI Patient seen with couple day history of some paresthesias involving his fingers of both hands.Marland Kitchen He thinks this involves all digits of both hands.. He has waken up couple times at night with symptoms but very transient and usually after position change after a minute or 2 they go away.   Denies any lower extremity symptoms. He denies any exertional chest pains. No cough. No pleuritic pain. No neck pain. No weakness. No history of diabetes. TSH was normal last June  Has hypertension and hx of Atrial fibrillation- on chronic coumadin.  Past Medical History:  Diagnosis Date  . Anal fissure 06/11/2009  . Atrial fibrillation ([place]) 06/11/2009  . HYPERTENSION 06/11/2009  . NECK MASS 10/01/2010  . Prostate cancer [facility])    Past Surgical History:  Procedure Laterality Date  . CYSTOSCOPY  2000   hand  . PROSTATE BIOPSY      reports that he quit smoking about 26 years ago. His smoking use included cigars. He quit after 50.00 years of use. he has never used smokeless tobacco. He reports that he does not drink alcohol or use drugs. family history includes Cancer in his brother; Cancer (age of onset: 70) in his sister. No Known Allergies   Review of Systems  Constitutional: Negative for fatigue.  Eyes: Negative for visual disturbance.  Respiratory: Negative for cough, chest tightness and shortness of breath.   Cardiovascular: Negative for chest pain, palpitations and leg swelling.  Neurological: Negative for dizziness, syncope, weakness, light-headedness and headaches.       Objective:   Physical Exam  Constitutional: He is oriented to person, place, and time. He appears well-developed and well-nourished.  HENT:  Right Ear: External ear normal.  Left Ear: External ear normal.  Mouth/Throat: Oropharynx is clear and moist.  Eyes: Pupils are equal, round, and reactive to light.  Neck: Neck  supple. No thyromegaly present.  Cardiovascular: Normal rate.  Pulmonary/Chest: Effort normal and breath sounds normal. No respiratory distress. He has no wheezes. He has no rales.  Musculoskeletal: He exhibits no edema.  Neurological: He is alert and oriented to person, place, and time. He has normal reflexes. No cranial nerve deficit. Coordination normal.  No focal strength deficits. Reflexes are symmetric Normal sensory to touch.       Assessment:     #1 Patient presents with paresthesias involving upper extremity. Suspect positional.  Reassuring is fact that symptoms go away after couple of minutes with position change.  #2 hypertension -stable.    Plan:     -Check B12 level. -Patient already had recent TSH which was normal. He has no history of diabetes. -Follow-up promptly for any weakness or other concerns  Mark Post MD [place] [facility] at [facility]

## 2017-10-18 NOTE — Patient Instructions (Signed)

## 2017-10-27 ENCOUNTER — Telehealth: Payer: Self-pay | Admitting: Family Medicine

## 2017-10-27 NOTE — Telephone Encounter (Signed)
Copied from [place] [phone]. Topic: Inquiry >> Oct 27, 2017 11:35 AM Malena Catholic I, NT wrote: Reason for CRM: pt call and hi said his only can take Cumarin and they send Warfarin and his only can take Coumadin please call Pharmacy and change to Coumadin

## 2017-10-30 ENCOUNTER — Other Ambulatory Visit: Payer: Self-pay | Admitting: General Practice

## 2017-10-30 ENCOUNTER — Ambulatory Visit: Payer: Medicare Other

## 2017-10-30 MED ORDER — WARFARIN SODIUM 2.5 MG PO TABS: ORAL_TABLET | ORAL | 1 refills | Status: DC

## 2017-11-06 ENCOUNTER — Ambulatory Visit: Payer: Medicare Other

## 2017-11-06 ENCOUNTER — Ambulatory Visit (INDEPENDENT_AMBULATORY_CARE_PROVIDER_SITE_OTHER): Payer: Medicare Other | Admitting: General Practice

## 2017-11-06 DIAGNOSIS — Z7901 Long term (current) use of anticoagulants: Secondary | ICD-10-CM

## 2017-11-06 LAB — POCT INR: INR: 1.9

## 2017-11-06 NOTE — Patient Instructions (Addendum)
Pre visit review using our clinic review tool, if applicable. No additional management support is needed unless otherwise documented below in the visit note.  Take 3 coumadin tablets (7.5 mg) today and then continue to take 1 tablet daily except 2 tablets every Monday. Re-check in 4 weeks.

## 2017-11-20 ENCOUNTER — Other Ambulatory Visit: Payer: Self-pay | Admitting: General Practice

## 2017-11-20 ENCOUNTER — Telehealth: Payer: Self-pay | Admitting: Family Medicine

## 2017-11-20 MED ORDER — WARFARIN SODIUM 2.5 MG PO TABS: ORAL_TABLET | ORAL | 1 refills | Status: DC

## 2017-11-20 NOTE — Telephone Encounter (Signed)
Copied from [place] [phone]. Topic: Quick Communication - See Telephone Encounter >> Nov 20, 2017 10:13 AM Percell Belt A wrote: CRM for notification. See Telephone encounter for:  pt wife called in and said that the warfarin (COUMADIN) 2.5 MG tablet [[id number] is not working. She said that he is wanting the name brand.   Pt does not the the Warfarin  [facility]   11/20/17.

## 2017-11-20 NOTE — Telephone Encounter (Signed)
I re-sent script to [facility] requesting them to fill with coumadin.  I also requested coumadin for previous script.  Patient has been notified that request has been sent.

## 2017-11-21 ENCOUNTER — Ambulatory Visit: Payer: Medicare Other

## 2017-11-22 ENCOUNTER — Ambulatory Visit: Payer: Medicare Other

## 2017-11-22 ENCOUNTER — Ambulatory Visit (INDEPENDENT_AMBULATORY_CARE_PROVIDER_SITE_OTHER): Payer: Medicare Other | Admitting: General Practice

## 2017-11-22 DIAGNOSIS — Z7901 Long term (current) use of anticoagulants: Secondary | ICD-10-CM

## 2017-11-22 LAB — POCT INR: INR: 1.7

## 2017-11-22 NOTE — Patient Instructions (Addendum)
Pre visit review using our clinic review tool, if applicable. No additional management support is needed unless otherwise documented below in the visit note.  Take 2 tablets today and then continue to take 1 tablet daily except 2 tablets every Monday. Re-check in 4 weeks.

## 2017-11-27 ENCOUNTER — Other Ambulatory Visit: Payer: Self-pay | Admitting: General Practice

## 2017-11-27 MED ORDER — WARFARIN SODIUM 2.5 MG PO TABS: ORAL_TABLET | ORAL | 1 refills | Status: DC

## 2017-12-06 ENCOUNTER — Ambulatory Visit: Payer: Medicare Other

## 2017-12-14 DIAGNOSIS — H353212 Exudative age-related macular degeneration, right eye, with inactive choroidal neovascularization: Secondary | ICD-10-CM | POA: Diagnosis not present

## 2017-12-14 DIAGNOSIS — H353221 Exudative age-related macular degeneration, left eye, with active choroidal neovascularization: Secondary | ICD-10-CM | POA: Diagnosis not present

## 2017-12-14 DIAGNOSIS — H35352 Cystoid macular degeneration, left eye: Secondary | ICD-10-CM | POA: Diagnosis not present

## 2017-12-14 DIAGNOSIS — H35052 Retinal neovascularization, unspecified, left eye: Secondary | ICD-10-CM | POA: Diagnosis not present

## 2017-12-14 DIAGNOSIS — H353123 Nonexudative age-related macular degeneration, left eye, advanced atrophic without subfoveal involvement: Secondary | ICD-10-CM | POA: Diagnosis not present

## 2017-12-18 ENCOUNTER — Ambulatory Visit (INDEPENDENT_AMBULATORY_CARE_PROVIDER_SITE_OTHER): Payer: Medicare Other | Admitting: General Practice

## 2017-12-18 DIAGNOSIS — I4891 Unspecified atrial fibrillation: Secondary | ICD-10-CM

## 2017-12-18 DIAGNOSIS — Z7901 Long term (current) use of anticoagulants: Secondary | ICD-10-CM | POA: Diagnosis not present

## 2017-12-18 LAB — POCT INR: INR: 2.2

## 2017-12-18 NOTE — Patient Instructions (Addendum)
Pre visit review using our clinic review tool, if applicable. No additional management support is needed unless otherwise documented below in the visit note.  Continue to take 1 tablet daily except 2 tablets every Monday. Re-check in 4 weeks.  

## 2017-12-19 ENCOUNTER — Ambulatory Visit (INDEPENDENT_AMBULATORY_CARE_PROVIDER_SITE_OTHER): Payer: Medicare Other

## 2017-12-19 ENCOUNTER — Ambulatory Visit (INDEPENDENT_AMBULATORY_CARE_PROVIDER_SITE_OTHER): Payer: Medicare Other | Admitting: Family Medicine

## 2017-12-19 ENCOUNTER — Encounter: Payer: Self-pay | Admitting: Family Medicine

## 2017-12-19 VITALS — BP 164/80 | HR 53 | Ht 69.0 in | Wt 191.1 lb

## 2017-12-19 VITALS — BP 142/70 | HR 62 | Temp 97.6°F

## 2017-12-19 DIAGNOSIS — C61 Malignant neoplasm of prostate: Secondary | ICD-10-CM | POA: Diagnosis not present

## 2017-12-19 DIAGNOSIS — I4891 Unspecified atrial fibrillation: Secondary | ICD-10-CM

## 2017-12-19 DIAGNOSIS — Z0000 Encounter for general adult medical examination without abnormal findings: Secondary | ICD-10-CM | POA: Diagnosis not present

## 2017-12-19 DIAGNOSIS — I10 Essential (primary) hypertension: Secondary | ICD-10-CM

## 2017-12-19 LAB — PSA: PSA: 0.88 ng/mL (ref 0.10–4.00)

## 2017-12-19 MED ORDER — CLOTRIMAZOLE-BETAMETHASONE 1-0.05 % EX CREA: TOPICAL_CREAM | CUTANEOUS | 1 refills | Status: DC

## 2017-12-19 NOTE — Progress Notes (Signed)
Subjective:     Patient ID: Mark Wu, male   DOB: 07-21-1926, 84 y.o.   MRN: [id number]  HPI   Patient is here for the following issues:  Requesting PSA. He has prostate cancer which was diagnosed last year. He is followed in [facility] and is getting injections which apparently are Lupron injections. He was requested to get follow-up PSA today and have labs sent to his urologist. He denies any obstructive urinary symptoms.  He has history of hypertension and atrial fibrillation. Compliant with medications. Not monitoring blood pressure at home. Blood pressure today here initially 170/70. This has been atypical for him. He remains on Coumadin for his atrial fibrillation. Denies any bleeding complications. No headaches. No chest pains. No peripheral edema.  Past Medical History:  Diagnosis Date  . Anal fissure 06/11/2009  . Atrial fibrillation ([place]) 06/11/2009  . HYPERTENSION 06/11/2009  . NECK MASS 10/01/2010  . Prostate cancer [facility])    Past Surgical History:  Procedure Laterality Date  . CYSTOSCOPY  2000   hand  . PROSTATE BIOPSY      reports that he quit smoking about 26 years ago. His smoking use included cigars. He quit after 50.00 years of use. he has never used smokeless tobacco. He reports that he does not drink alcohol or use drugs. family history includes Cancer in his brother; Cancer (age of onset: 52) in his sister. No Known Allergies   Review of Systems  Constitutional: Negative for fatigue.  Eyes: Negative for visual disturbance.  Respiratory: Negative for cough, chest tightness and shortness of breath.   Cardiovascular: Negative for chest pain, palpitations and leg swelling.  Endocrine: Negative for polydipsia and polyuria.  Genitourinary: Negative for dysuria.  Neurological: Negative for dizziness, syncope, weakness, light-headedness and headaches.       Objective:   Physical Exam  Constitutional: He is oriented to person, place, and time. He appears  well-developed and well-nourished.  HENT:  Right Ear: External ear normal.  Left Ear: External ear normal.  Mouth/Throat: Oropharynx is clear and moist.  Eyes: Pupils are equal, round, and reactive to light.  Neck: Neck supple. No thyromegaly present.  Cardiovascular: Normal rate.  Pulmonary/Chest: Effort normal and breath sounds normal. No respiratory distress. He has no wheezes. He has no rales.  Musculoskeletal: He exhibits no edema.  Neurological: He is alert and oriented to person, place, and time.       Assessment:     #1 prostate cancer followed by urology  #2 hypertension history. Generally well-controlled but up today somewhat with systolic elevation  #3 chronic atrial fibrillation on chronic anticoagulation    Plan:     -Recheck PSA and will forward results to his urologist -We recommend he monitor blood pressure closely at home and be in touch if consistently greater than 140/90 -He has gained some weight which may be exacerbating poor blood pressure control. Try to lose a few pounds. Increase physical exercise -Bring back to reassess in 2 months. Blood pressure not improved at that point consider additional medication -Continue close follow-up with Coumadin clinic regarding his pro times  Mark Post MD [facility]

## 2017-12-19 NOTE — Patient Instructions (Signed)
Monitor blood pressure at home Be in touch if consistently > 140/90.

## 2017-12-19 NOTE — Patient Instructions (Addendum)
Mark Wu , Thank you for taking time to come for your Medicare Wellness Visit. I appreciate your ongoing commitment to your health goals. Please review the following plan we discussed and let me know if I can assist you in the future.   You need a tetanus and get the one with pertussis in it.  Educated to check with insurance regarding coverage of Shingles vaccination on Part D or Part B and may have lower co-pay if provided on the Part D side Shingrix is a vaccine for the prevention of Shingles in Adults 38 and older.  If you are on Medicare, you can request a prescription from your doctor to be filled at a pharmacy.  Please check with your benefits regarding applicable copays or out of pocket expenses. [employer] will pay for oop on your injections;   The Shingrix is given in 2 vaccines approx 8 weeks apart. You must receive the 2nd dose prior to 6 months from receipt of the first.   Deaf & Hard of Hearing Division Services - can assist with hearing aid x 1  No reviews  [place]  [address]  [phone]  http://[email or web address].acquia-[email or web address]/default/files/hearingpedia/Guide_How_to_Buy_Hearing_Aids.pdf     These are the goals we discussed: Goals    . Exercise 150 min/wk Moderate Activity     Keep getting up and moving         This is a list of the screening recommended for you and due dates:  Health Maintenance  Topic Date Due  . Tetanus Vaccine  08/11/1945  . Flu Shot  Completed  . Pneumonia vaccines  Completed      Fall Prevention in the Home Falls can cause injuries. They can happen to people of all ages. There are many things you can do to make your home safe and to help prevent falls. What can I do on the outside of my home?  Regularly fix the edges of walkways and driveways and fix any cracks.  Remove anything that might make you trip as you walk through a door, such as a raised step or threshold.  Trim any bushes  or trees on the path to your home.  Use bright outdoor lighting.  Clear any walking paths of anything that might make someone trip, such as rocks or tools.  Regularly check to see if handrails are loose or broken. Make sure that both sides of any steps have handrails.  Any raised decks and porches should have guardrails on the edges.  Have any leaves, snow, or ice cleared regularly.  Use sand or salt on walking paths during winter.  Clean up any spills in your garage right away. This includes oil or grease spills. What can I do in the bathroom?  Use night lights.  Install grab bars by the toilet and in the tub and shower. Do not use towel bars as grab bars.  Use non-skid mats or decals in the tub or shower.  If you need to sit down in the shower, use a plastic, non-slip stool.  Keep the floor dry. Clean up any water that spills on the floor as soon as it happens.  Remove soap buildup in the tub or shower regularly.  Attach bath mats securely with double-sided non-slip rug tape.  Do not have throw rugs and other things on the floor that can make you trip. What can I do in the bedroom?  Use night lights.  Make sure that you have a light  by your bed that is easy to reach.  Do not use any sheets or blankets that are too big for your bed. They should not hang down onto the floor.  Have a firm chair that has side arms. You can use this for support while you get dressed.  Do not have throw rugs and other things on the floor that can make you trip. What can I do in the kitchen?  Clean up any spills right away.  Avoid walking on wet floors.  Keep items that you use a lot in easy-to-reach places.  If you need to reach something above you, use a strong step stool that has a grab bar.  Keep electrical cords out of the way.  Do not use floor polish or wax that makes floors slippery. If you must use wax, use non-skid floor wax.  Do not have throw rugs and other things on  the floor that can make you trip. What can I do with my stairs?  Do not leave any items on the stairs.  Make sure that there are handrails on both sides of the stairs and use them. Fix handrails that are broken or loose. Make sure that handrails are as long as the stairways.  Check any carpeting to make sure that it is firmly attached to the stairs. Fix any carpet that is loose or worn.  Avoid having throw rugs at the top or bottom of the stairs. If you do have throw rugs, attach them to the floor with carpet tape.  Make sure that you have a light switch at the top of the stairs and the bottom of the stairs. If you do not have them, ask someone to add them for you. What else can I do to help prevent falls?  Wear shoes that: ? Do not have high heels. ? Have rubber bottoms. ? Are comfortable and fit you well. ? Are closed at the toe. Do not wear sandals.  If you use a stepladder: ? Make sure that it is fully opened. Do not climb a closed stepladder. ? Make sure that both sides of the stepladder are locked into place. ? Ask someone to hold it for you, if possible.  Clearly mark and make sure that you can see: ? Any grab bars or handrails. ? First and last steps. ? Where the edge of each step is.  Use tools that help you move around (mobility aids) if they are needed. These include: ? Canes. ? Walkers. ? Scooters. ? Crutches.  Turn on the lights when you go into a dark area. Replace any light bulbs as soon as they burn out.  Set up your furniture so you have a clear path. Avoid moving your furniture around.  If any of your floors are uneven, fix them.  If there are any pets around you, be aware of where they are.  Review your medicines with your doctor. Some medicines can make you feel dizzy. This can increase your chance of falling. Ask your doctor what other things that you can do to help prevent falls. This information is not intended to replace advice given to you by  your health care provider. Make sure you discuss any questions you have with your health care provider. Document Released: 07/30/2009 Document Revised: 03/10/2016 Document Reviewed: 11/07/2014 Elsevier Interactive Patient Education  2018 [place] Maintenance, Male A healthy lifestyle and preventive care is important for your health and wellness. Ask your health care provider  about what schedule of regular examinations is right for you. What should I know about weight and diet? Eat a Healthy Diet  Eat plenty of vegetables, fruits, whole grains, low-fat dairy products, and lean protein.  Do not eat a lot of foods high in solid fats, added sugars, or salt.  Maintain a Healthy Weight Regular exercise can help you achieve or maintain a healthy weight. You should:  Do at least 150 minutes of exercise each week. The exercise should increase your heart rate and make you sweat (moderate-intensity exercise).  Do strength-training exercises at least twice a week.  Watch Your Levels of Cholesterol and Blood Lipids  Have your blood tested for lipids and cholesterol every 5 years starting at 82 years of age. If you are at high risk for heart disease, you should start having your blood tested when you are 82 years old. You may need to have your cholesterol levels checked more often if: ? Your lipid or cholesterol levels are high. ? You are older than 82 years of age. ? You are at high risk for heart disease.  What should I know about cancer screening? Many types of cancers can be detected early and may often be prevented. Lung Cancer  You should be screened every year for lung cancer if: ? You are a current smoker who has smoked for at least 30 years. ? You are a former smoker who has quit within the past 15 years.  Talk to your health care provider about your screening options, when you should start screening, and how often you should be screened.  Colorectal Cancer  Routine  colorectal cancer screening usually begins at 82 years of age and should be repeated every 5-10 years until you are 82 years old. You may need to be screened more often if early forms of precancerous polyps or small growths are found. Your health care provider may recommend screening at an earlier age if you have risk factors for colon cancer.  Your health care provider may recommend using home test kits to check for hidden blood in the stool.  A small camera at the end of a tube can be used to examine your colon (sigmoidoscopy or colonoscopy). This checks for the earliest forms of colorectal cancer.  Prostate and Testicular Cancer  Depending on your age and overall health, your health care provider may do certain tests to screen for prostate and testicular cancer.  Talk to your health care provider about any symptoms or concerns you have about testicular or prostate cancer.  Skin Cancer  Check your skin from head to toe regularly.  Tell your health care provider about any new moles or changes in moles, especially if: ? There is a change in a mole's size, shape, or color. ? You have a mole that is larger than a pencil eraser.  Always use sunscreen. Apply sunscreen liberally and repeat throughout the day.  Protect yourself by wearing long sleeves, pants, a wide-brimmed hat, and sunglasses when outside.  What should I know about heart disease, diabetes, and high blood pressure?  If you are 69-68 years of age, have your blood pressure checked every 3-5 years. If you are 4 years of age or older, have your blood pressure checked every year. You should have your blood pressure measured twice-once when you are at a hospital or clinic, and once when you are not at a hospital or clinic. Record the average of the two measurements. To check your blood pressure when  you are not at a hospital or clinic, you can use: ? An automated blood pressure machine at a pharmacy. ? A home blood pressure  monitor.  Talk to your health care provider about your target blood pressure.  If you are between 53-73 years old, ask your health care provider if you should take aspirin to prevent heart disease.  Have regular diabetes screenings by checking your fasting blood sugar level. ? If you are at a normal weight and have a low risk for diabetes, have this test once every three years after the age of 82. ? If you are overweight and have a high risk for diabetes, consider being tested at a younger age or more often.  A one-time screening for abdominal aortic aneurysm (AAA) by ultrasound is recommended for men aged 34-75 years who are current or former smokers. What should I know about preventing infection? Hepatitis B If you have a higher risk for hepatitis B, you should be screened for this virus. Talk with your health care provider to find out if you are at risk for hepatitis B infection. Hepatitis C Blood testing is recommended for:  Everyone born from [birth year] through 1963.  Anyone with known risk factors for hepatitis C.  Sexually Transmitted Diseases (STDs)  You should be screened each year for STDs including gonorrhea and chlamydia if: ? You are sexually active and are younger than 82 years of age. ? You are older than 82 years of age and your health care provider tells you that you are at risk for this type of infection. ? Your sexual activity has changed since you were last screened and you are at an increased risk for chlamydia or gonorrhea. Ask your health care provider if you are at risk.  Talk with your health care provider about whether you are at high risk of being infected with HIV. Your health care provider may recommend a prescription medicine to help prevent HIV infection.  What else can I do?  Schedule regular health, dental, and eye exams.  Stay current with your vaccines (immunizations).  Do not use any tobacco products, such as cigarettes, chewing tobacco, and  e-cigarettes. If you need help quitting, ask your health care provider.  Limit alcohol intake to no more than 2 drinks per day. One drink equals 12 ounces of beer, 5 ounces of wine, or 1 ounces of hard liquor.  Do not use street drugs.  Do not share needles.  Ask your health care provider for help if you need support or information about quitting drugs.  Tell your health care provider if you often feel depressed.  Tell your health care provider if you have ever been abused or do not feel safe at home. This information is not intended to replace advice given to you by your health care provider. Make sure you discuss any questions you have with your health care provider. Document Released: 03/31/2008 Document Revised: 06/01/2016 Document Reviewed: 07/07/2015 Elsevier Interactive Patient Education  2018 [employer].   Hearing Loss Hearing loss is a partial or total loss of the ability to hear. This can be temporary or permanent, and it can happen in one or both ears. Hearing loss may be referred to as deafness. Medical care is necessary to treat hearing loss properly and to prevent the condition from getting worse. Your hearing may partially or completely come back, depending on what caused your hearing loss and how severe it is. In some cases, hearing loss is permanent. What are  the causes? Common causes of hearing loss include:  Too much wax in the ear canal.  Infection of the ear canal or middle ear.  Fluid in the middle ear.  Injury to the ear or surrounding area.  An object stuck in the ear.  Prolonged exposure to loud sounds, such as music.  Less common causes of hearing loss include:  Tumors in the ear.  Viral or bacterial infections, such as meningitis.  A hole in the eardrum (perforated eardrum).  Problems with the hearing nerve that sends signals between the brain and the ear.  Certain medicines.  What are the signs or symptoms? Symptoms of this condition  may include:  Difficulty telling the difference between sounds.  Difficulty following a conversation when there is background noise.  Lack of response to sounds in your environment. This may be most noticeable when you do not respond to startling sounds.  Needing to turn up the volume on the television, radio, etc.  Ringing in the ears.  Dizziness.  Pain in the ears.  How is this diagnosed? This condition is diagnosed based on a physical exam and a hearing test (audiometry). The audiometry test will be performed by a hearing specialist (audiologist). You may also be referred to an ear, nose, and throat (ENT) specialist (otolaryngologist). How is this treated? Treatment for recent onset of hearing loss may include:  Ear wax removal.  Being prescribed medicines to prevent infection (antibiotics).  Being prescribed medicines to reduce inflammation (corticosteroids).  Follow these instructions at home:  If you were prescribed an antibiotic medicine, take it as told by your health care provider. Do not stop taking the antibiotic even if you start to feel better.  Take over-the-counter and prescription medicines only as told by your health care provider.  Avoid loud noises.  Return to your normal activities as told by your health care provider. Ask your health care provider what activities are safe for you.  Keep all follow-up visits as told by your health care provider. This is important. Contact a health care provider if:  You feel dizzy.  You develop new symptoms.  You vomit or feel nauseous.  You have a fever. Get help right away if:  You develop sudden changes in your vision.  You have severe ear pain.  You have new or increased weakness.  You have a severe headache. This information is not intended to replace advice given to you by your health care provider. Make sure you discuss any questions you have with your health care provider. Document Released:  10/03/2005 Document Revised: 03/10/2016 Document Reviewed: 02/18/2015 Elsevier Interactive Patient Education  2018 [employer].

## 2017-12-19 NOTE — Progress Notes (Addendum)
Subjective:   Detroit Frieden is a 84 y.o. male who presents for Medicare Annual/Subsequent preventive examination.  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 Has 2 grandsons and one grand dtr Eagle scout and one graduated state Stationed in [place] Wife died of colon cancer   Found out he had prostate cancer but want die of this Dr. Estill Dooms in [place] BMI 28 Breakfast omelette once a week; Cheerios and banana Lunch; green beans, potatoes and corn bread Baked chicken  Exercise Trades hay for labs; 2 black and 2 chocolate  Does small weights when he gets up  Jog out to his mailbox 120 yards  Labs keep him busy bushhog and mowing    Health Maintenance Due  Topic Date Due  . Samul Dada  08/11/1945  Educated regarding shingrix    Smoking history significant but quit 84 yo  Aged out of AAA      Objective:    Vitals: BP (!) 164/80   Pulse (!) 53   Ht 5\' 9"  (1.753 m)   Wt 191 lb 2 oz (86.7 kg)   SpO2 96%   BMI 28.22 kg/m   Body mass index is 28.22 kg/m.  Advanced Directives 09/11/2017  Does Patient Have a Medical Advance Directive? No  Would patient like information on creating a medical advance directive? No - Patient declined    Tobacco Social History   Tobacco Use  Smoking Status Former Smoker  . Years: 50.00  . Types: Cigars  . Last attempt to quit: 08/08/1991  . Years since quitting: 26.3  Smokeless Tobacco Never Used     Counseling given: Yes   Clinical Intake:   Past Medical History:  Diagnosis Date  . Anal fissure 06/11/2009  . Atrial fibrillation ([place]) 06/11/2009  . HYPERTENSION 06/11/2009  . NECK MASS 10/01/2010  . Prostate cancer [facility])    Past Surgical History:  Procedure Laterality  Date  . CYSTOSCOPY  2000   hand  . PROSTATE BIOPSY     Family History  Problem Relation Age of Onset  . Cancer Sister 43       unknown type  . Cancer Brother        prostate/seed implant with [name]/died January 18, 2014 of heart attack   Social History   Socioeconomic History  . Marital status: Married    Spouse name: Not on file  . Number of children: Not on file  . Years of education: Not on file  . Highest education level: Not on file  Social Needs  . Financial resource strain: Not on file  . Food insecurity - worry: Not on file  . Food insecurity - inability: Not on file  . Transportation needs - medical: Not on file  . Transportation needs - non-medical: Not on file  Occupational History  . Occupation: retired    Comment: retired from [employer] in [place] Use  . Smoking status: Former Smoker    Years: 50.00    Types: Cigars    Last attempt to quit: 08/08/1991    Years since quitting: 26.3  . Smokeless tobacco: Never Used  Substance and Sexual Activity  . Alcohol use: No  . Drug use: No  . Sexual activity: No  Other  Topics Concern  . Not on file  Social History Narrative   Resides in [place] (near [place], [place])    Outpatient Encounter Medications as of 12/19/2017  Medication Sig  . amLODipine (NORVASC) 5 MG tablet TAKE 1 TABLET DAILY (NEED PHYSICAL)  . clotrimazole-betamethasone (LOTRISONE) cream Apply topically as needed.  . metoprolol succinate (TOPROL XL) 50 MG 24 hr tablet   . warfarin (COUMADIN) 2.5 MG tablet Take 1 tablet daily except 2 tablets on Mondays.  Please fill with coumadin. 90 day  . tamsulosin (FLOMAX) 0.4 MG CAPS capsule Take 1 capsule (0.4 mg total) daily by mouth. (Patient not taking: Reported on 12/19/2017)   No facility-administered encounter medications on file as of 12/19/2017.     Activities of Daily Living No flowsheet data found.  Patient Care Team: [name], MD as PCP - General   Assessment:   This is a routine wellness  examination for Sem.  Exercise Activities and Dietary recommendations    Goals    . Exercise 150 min/wk Moderate Activity     Keep getting up and moving         Fall Risk Fall Risk  12/19/2017 09/11/2017 11/14/2016 06/02/2015 05/01/2014  Falls in the past year? Yes No No No No  Number falls in past yr: 1 - - - -  Injury with Fall? (No Data) - - - -  Comment fell in 14in snow; was moving something out of a building  - - - -    Depression Screen PHQ 2/9 Scores 12/19/2017 09/11/2017 11/14/2016 06/02/2015  PHQ - 2 Score 0 0 0 0    Cognitive Function     Ad8 score reviewed for issues:  Issues making decisions:  Less interest in hobbies / activities:  Repeats questions, stories (family complaining):  Trouble using ordinary gadgets (microwave, computer, phone):  Forgets the month or year:   Mismanaging finances:   Remembering appts:  Daily problems with thinking and/or memory: Ad8 score is=0     Immunization History  Administered Date(s) Administered  . Influenza Split 08/08/2011, 08/29/2012  . Influenza Whole 08/11/2009, 08/04/2010  . Influenza, High Dose Seasonal PF 08/03/2015, 07/31/2017  . Influenza,inj,Quad PF,6+ Mos 07/11/2013, 07/21/2014  . Influenza-Unspecified 08/11/2016  . Pneumococcal Conjugate-13 09/05/2013  . Pneumococcal Polysaccharide-23 07/31/2017     Screening Tests Health Maintenance  Topic Date Due  . TETANUS/TDAP  08/11/1945  . INFLUENZA VACCINE  Completed  . PNA vac Low Risk Adult  Completed       Plan:      PCP Notes   Health Maintenance Will get tdap at pharmacy after they check to see if tricare will cover Will also get shingrix at pharmacy    Abnormal Screens  BP up today; 160-170/80 States he had his BP med  Right eye MD and treating OS; can see well from left eye   One fall in the deep snow this year. Still jogs to the mailbox and back   Hearing loss bilaterally; wears hearing aid on left   Referrals  None    Does not go to the [place] currently  They helped subsidize one hearing aid    Patient concerns; Still c/o of numbness in left hand mostly It will come and go, depending on how he lying    Nurse Concerns; Still drives; memory good; still takes care of his self    Next PCP apt -visit today    I have personally reviewed and noted the following in the patient's  chart:   . Medical and social history . Use of alcohol, tobacco or illicit drugs  . Current medications and supplements . Functional ability and status . Nutritional status . Physical activity . Advanced directives . List of other physicians . Hospitalizations, surgeries, and ER visits in previous 12 months . Vitals . Screenings to include cognitive, depression, and falls . Referrals and appointments  In addition, I have reviewed and discussed with patient certain preventive protocols, quality metrics, and best practice recommendations. A written personalized care plan for preventive services as well as general preventive health recommendations were provided to patient.     Wynetta Fines, RN  12/19/2017  Agree with assessment as above  [name] MD [facility] at [facility]

## 2017-12-29 DIAGNOSIS — C61 Malignant neoplasm of prostate: Secondary | ICD-10-CM | POA: Diagnosis not present

## 2017-12-29 DIAGNOSIS — Z192 Hormone resistant malignancy status: Secondary | ICD-10-CM | POA: Diagnosis not present

## 2018-01-15 ENCOUNTER — Ambulatory Visit (INDEPENDENT_AMBULATORY_CARE_PROVIDER_SITE_OTHER): Payer: Medicare Other | Admitting: General Practice

## 2018-01-15 DIAGNOSIS — I4891 Unspecified atrial fibrillation: Secondary | ICD-10-CM | POA: Diagnosis not present

## 2018-01-15 DIAGNOSIS — Z7901 Long term (current) use of anticoagulants: Secondary | ICD-10-CM

## 2018-01-15 LAB — POCT INR: INR: 2.2

## 2018-01-15 NOTE — Patient Instructions (Addendum)
Pre visit review using our clinic review tool, if applicable. No additional management support is needed unless otherwise documented below in the visit note.  Continue to take 1 tablet daily except 2 tablets every Monday. Re-check in 4 weeks.  

## 2018-01-22 ENCOUNTER — Other Ambulatory Visit: Payer: Self-pay | Admitting: Family Medicine

## 2018-01-22 NOTE — Telephone Encounter (Signed)
Refill of Toprol  Historical provider  LOV 12/19/17  Dr. Elease Hashimoto  Pt has f/u appt [facility]/19/18  [facility]

## 2018-01-22 NOTE — Telephone Encounter (Signed)
Copied from [place]. Topic: Quick Communication - Rx Refill/Question >> Jan 22, 2018 11:10 AM Carolyn Stare wrote: Medication   metoprolol succinate (TOPROL XL) 50 MG 24 hr tablet   Has the patient contacted their pharmacy yes     [facility]    Agent: Please be advised that RX refills may take up to 3 business days. We ask that you follow-up with your pharmacy.

## 2018-01-23 MED ORDER — METOPROLOL SUCCINATE ER 50 MG PO TB24
50.0000 mg | ORAL_TABLET | Freq: Every day | ORAL | 1 refills | Status: DC
Start: 2018-01-23 — End: 2018-08-10

## 2018-02-15 DIAGNOSIS — H353212 Exudative age-related macular degeneration, right eye, with inactive choroidal neovascularization: Secondary | ICD-10-CM | POA: Diagnosis not present

## 2018-02-15 DIAGNOSIS — H35052 Retinal neovascularization, unspecified, left eye: Secondary | ICD-10-CM | POA: Diagnosis not present

## 2018-02-15 DIAGNOSIS — H4052X2 Glaucoma secondary to other eye disorders, left eye, moderate stage: Secondary | ICD-10-CM | POA: Diagnosis not present

## 2018-02-15 DIAGNOSIS — H353221 Exudative age-related macular degeneration, left eye, with active choroidal neovascularization: Secondary | ICD-10-CM | POA: Diagnosis not present

## 2018-02-19 ENCOUNTER — Encounter: Payer: Self-pay | Admitting: Family Medicine

## 2018-02-19 ENCOUNTER — Ambulatory Visit (INDEPENDENT_AMBULATORY_CARE_PROVIDER_SITE_OTHER): Payer: Medicare Other | Admitting: Family Medicine

## 2018-02-19 VITALS — BP 130/60 | HR 47 | Temp 98.1°F | Wt 186.9 lb

## 2018-02-19 DIAGNOSIS — R202 Paresthesia of skin: Secondary | ICD-10-CM

## 2018-02-19 DIAGNOSIS — I10 Essential (primary) hypertension: Secondary | ICD-10-CM

## 2018-02-19 MED ORDER — WARFARIN SODIUM 2.5 MG PO TABS: ORAL_TABLET | ORAL | 1 refills | Status: DC

## 2018-02-19 NOTE — Patient Instructions (Addendum)
Carpal Tunnel Syndrome Carpal tunnel syndrome is a condition that causes pain in your hand and arm. The carpal tunnel is a narrow area located on the palm side of your wrist. Repeated wrist motion or certain diseases may cause swelling within the tunnel. This swelling pinches the main nerve in the wrist (median nerve). What are the causes? This condition may be caused by:  Repeated wrist motions.  Wrist injuries.  Arthritis.  A cyst or tumor in the carpal tunnel.  Fluid buildup during pregnancy.  Sometimes the cause of this condition is not known. What increases the risk? This condition is more likely to develop in:  People who have jobs that cause them to repeatedly move their wrists in the same motion, such as Art gallery manager.  Women.  People with certain conditions, such as: ? Diabetes. ? Obesity. ? An underactive thyroid (hypothyroidism). ? Kidney failure.  What are the signs or symptoms? Symptoms of this condition include:  A tingling feeling in your fingers, especially in your thumb, index, and middle fingers.  Tingling or numbness in your hand.  An aching feeling in your entire arm, especially when your wrist and elbow are bent for long periods of time.  Wrist pain that goes up your arm to your shoulder.  Pain that goes down into your palm or fingers.  A weak feeling in your hands. You may have trouble grabbing and holding items.  Your symptoms may feel worse during the night. How is this diagnosed? This condition is diagnosed with a medical history and physical exam. You may also have tests, including:  An electromyogram (EMG). This test measures electrical signals sent by your nerves into the muscles.  X-rays.  How is this treated? Treatment for this condition includes:  Lifestyle changes. It is important to stop doing or modify the activity that caused your condition.  Physical or occupational therapy.  Medicines for pain and inflammation.  This may include medicine that is injected into your wrist.  A wrist splint.  Surgery.  Follow these instructions at home: If you have a splint:  Wear it as told by your health care provider. Remove it only as told by your health care provider.  Loosen the splint if your fingers become numb and tingle, or if they turn cold and blue.  Keep the splint clean and dry. General instructions  Take over-the-counter and prescription medicines only as told by your health care provider.  Rest your wrist from any activity that may be causing your pain. If your condition is work related, talk to your employer about changes that can be made, such as getting a wrist pad to use while typing.  If directed, apply ice to the painful area: ? Put ice in a plastic bag. ? Place a towel between your skin and the bag. ? Leave the ice on for 20 minutes, 2-3 times per day.  Keep all follow-up visits as told by your health care provider. This is important.  Do any exercises as told by your health care provider, physical therapist, or occupational therapist. Contact a health care provider if:  You have new symptoms.  Your pain is not controlled with medicines.  Your symptoms get worse. This information is not intended to replace advice given to you by your health care provider. Make sure you discuss any questions you have with your health care provider. Document Released: 09/30/2000 Document Revised: 02/11/2016 Document Reviewed: 02/18/2015 Elsevier Interactive Patient Education  2018 [place] left  wrist splint and wear every night and frequently during the day.

## 2018-02-19 NOTE — Progress Notes (Signed)
  Subjective:     Patient ID: Mark Wu, male   DOB: 1926-04-16, 84 y.o.   MRN: [id number]  HPI Patient seen with some numbness and "tingling" sensation involving left hand greater than right. His symptoms last frequently through the day. He has noticed sometimes at night he wakes up with numbness and will go to the bathroom and after shaking his hand a couple times symptoms improve. He does not have any definite weakness. Occasional achy pain in the hand. Mostly involves second through fourth digits. No neck pain. Denies any past history of carpal tunnel syndrome. Frequently uses a weedeater at home but no other repetitive activities.  Recent B12 level 372.  Marland Kitchen No history of diabetes. Denies any numbness lower extremities  Has hypertension on amlodipine and metoprolol. Compliant with therapy. Recent blood pressure here elevated. Much improved today. No recent headaches or chest pains. No dizziness.  Past Medical History:  Diagnosis Date  . Anal fissure 06/11/2009  . Atrial fibrillation ([place]) 06/11/2009  . HYPERTENSION 06/11/2009  . NECK MASS 10/01/2010  . Prostate cancer [facility])    Past Surgical History:  Procedure Laterality Date  . CYSTOSCOPY  2000   hand  . PROSTATE BIOPSY      reports that he quit smoking about 26 years ago. His smoking use included cigars. He quit after 50.00 years of use. He has never used smokeless tobacco. He reports that he does not drink alcohol or use drugs. family history includes Cancer in his brother; Cancer (age of onset: 58) in his sister. No Known Allergies   Review of Systems  Constitutional: Negative for fatigue.  Eyes: Negative for visual disturbance.  Respiratory: Negative for cough, chest tightness and shortness of breath.   Cardiovascular: Negative for chest pain, palpitations and leg swelling.  Neurological: Positive for numbness. Negative for dizziness, syncope, weakness, light-headedness and headaches.       Objective:   Physical Exam   Constitutional: He is oriented to person, place, and time. He appears well-developed and well-nourished.  Cardiovascular: Normal rate.  Pulmonary/Chest: Breath sounds normal. No respiratory distress.  Musculoskeletal: He exhibits no edema.  Patient has scar right wrist from previous surgery for dupuytrens's contracture. He has Dupuytren's of the left  Neurological: He is alert and oriented to person, place, and time. He displays normal reflexes. A sensory deficit is present. No cranial nerve deficit.  Full-strength with grips bilaterally  He does have some sensory impairment monofilament testing of the left hand       Assessment:     #1 hypertension improved  #2 paresthesias left hand greater than right. Question carpal tunnel syndrome    Plan:     -Recommend left wrist splint to wear consistently at night and frequently during the day of the next few weeks and reassess here in 3 weeks' time -Consider nerve conduction velocities of not improved at follow-up -Concern of further labs with glucose, TSH, SPEP at follow-up-if not resolving with wrist brace.. Recent B12 normal  [name] MD [facility] at [facility]

## 2018-02-26 ENCOUNTER — Ambulatory Visit (INDEPENDENT_AMBULATORY_CARE_PROVIDER_SITE_OTHER): Payer: Medicare Other | Admitting: General Practice

## 2018-02-26 DIAGNOSIS — Z7901 Long term (current) use of anticoagulants: Secondary | ICD-10-CM | POA: Diagnosis not present

## 2018-02-26 DIAGNOSIS — I4891 Unspecified atrial fibrillation: Secondary | ICD-10-CM

## 2018-02-26 LAB — POCT INR: INR: 2.1

## 2018-02-26 NOTE — Patient Instructions (Addendum)
Pre visit review using our clinic review tool, if applicable. No additional management support is needed unless otherwise documented below in the visit note.  Continue to take 1 tablet daily except 2 tablets every Monday. Re-check in 6 weeks.

## 2018-03-13 ENCOUNTER — Encounter: Payer: Self-pay | Admitting: Family Medicine

## 2018-03-13 ENCOUNTER — Ambulatory Visit (INDEPENDENT_AMBULATORY_CARE_PROVIDER_SITE_OTHER): Payer: Medicare Other | Admitting: Family Medicine

## 2018-03-13 VITALS — BP 122/58 | HR 53 | Temp 97.8°F | Wt 185.9 lb

## 2018-03-13 DIAGNOSIS — I10 Essential (primary) hypertension: Secondary | ICD-10-CM | POA: Diagnosis not present

## 2018-03-13 DIAGNOSIS — G5602 Carpal tunnel syndrome, left upper limb: Secondary | ICD-10-CM

## 2018-03-13 NOTE — Progress Notes (Signed)
  Subjective:     Patient ID: Mark Wu, male   DOB: 11-08-1925, 84 y.o.   MRN: [id number]  HPI Patient here for follow-up regarding recent presumed left carpal tunnel syndrome. He is having some numbness and soreness especially at night. He got a wrist splint and has been consistently wearing this at night. His numbness is improved at this time. Less severe and less frequent symptoms. No weakness. No neck pain.  Recent mild elevation of blood pressure. Blood pressure today is very stable. No headaches or dizziness.  He remains on amlodipine and metoprolol for hypertension  Past Medical History:  Diagnosis Date  . Anal fissure 06/11/2009  . Atrial fibrillation ([place]) 06/11/2009  . HYPERTENSION 06/11/2009  . NECK MASS 10/01/2010  . Prostate cancer [facility])    Past Surgical History:  Procedure Laterality Date  . CYSTOSCOPY  2000   hand  . PROSTATE BIOPSY      reports that he quit smoking about 26 years ago. His smoking use included cigars. He quit after 50.00 years of use. He has never used smokeless tobacco. He reports that he does not drink alcohol or use drugs. family history includes Cancer in his brother; Cancer (age of onset: 5) in his sister. No Known Allergies   Review of Systems  Constitutional: Negative for fatigue.  Eyes: Negative for visual disturbance.  Respiratory: Negative for cough, chest tightness and shortness of breath.   Cardiovascular: Negative for chest pain, palpitations and leg swelling.  Endocrine: Negative for polydipsia and polyuria.  Neurological: Negative for dizziness, syncope, weakness, light-headedness and headaches.       Objective:   Physical Exam  Constitutional: He appears well-developed and well-nourished.  Cardiovascular: Normal rate.  Pulmonary/Chest: Effort normal and breath sounds normal. No respiratory distress. He has no wheezes. He has no rales.  Musculoskeletal: He exhibits no edema.  Neurological:  Patient has full strength left  hand with gripping. No sensory impairment to touch.       Assessment:     #1 Left carpal tunnel syndrome improved following night splinting  #2 hypertension stable and at goal    Plan:     -Continue night splint as needed for carpal tunnel syndrome symptoms -Continue current blood pressure regimen  [name] MD [facility] at [facility]

## 2018-03-16 ENCOUNTER — Other Ambulatory Visit: Payer: Self-pay | Admitting: Family Medicine

## 2018-04-04 DIAGNOSIS — X32XXXD Exposure to sunlight, subsequent encounter: Secondary | ICD-10-CM | POA: Diagnosis not present

## 2018-04-04 DIAGNOSIS — D225 Melanocytic nevi of trunk: Secondary | ICD-10-CM | POA: Diagnosis not present

## 2018-04-04 DIAGNOSIS — Z1283 Encounter for screening for malignant neoplasm of skin: Secondary | ICD-10-CM | POA: Diagnosis not present

## 2018-04-04 DIAGNOSIS — L570 Actinic keratosis: Secondary | ICD-10-CM | POA: Diagnosis not present

## 2018-04-09 ENCOUNTER — Ambulatory Visit (INDEPENDENT_AMBULATORY_CARE_PROVIDER_SITE_OTHER): Payer: Medicare Other | Admitting: General Practice

## 2018-04-09 DIAGNOSIS — Z7901 Long term (current) use of anticoagulants: Secondary | ICD-10-CM

## 2018-04-09 DIAGNOSIS — I4891 Unspecified atrial fibrillation: Secondary | ICD-10-CM

## 2018-04-09 LAB — POCT INR: INR: 2.7 (ref 2.0–3.0)

## 2018-04-09 NOTE — Patient Instructions (Addendum)
Pre visit review using our clinic review tool, if applicable. No additional management support is needed unless otherwise documented below in the visit note.  Continue to take 1 tablet daily except 2 tablets every Monday. Re-check in 6 weeks. 

## 2018-04-25 DIAGNOSIS — H4052X2 Glaucoma secondary to other eye disorders, left eye, moderate stage: Secondary | ICD-10-CM | POA: Diagnosis not present

## 2018-04-25 DIAGNOSIS — H35052 Retinal neovascularization, unspecified, left eye: Secondary | ICD-10-CM | POA: Diagnosis not present

## 2018-04-25 DIAGNOSIS — H353212 Exudative age-related macular degeneration, right eye, with inactive choroidal neovascularization: Secondary | ICD-10-CM | POA: Diagnosis not present

## 2018-04-25 DIAGNOSIS — H353221 Exudative age-related macular degeneration, left eye, with active choroidal neovascularization: Secondary | ICD-10-CM | POA: Diagnosis not present

## 2018-04-25 DIAGNOSIS — H35352 Cystoid macular degeneration, left eye: Secondary | ICD-10-CM | POA: Diagnosis not present

## 2018-05-17 ENCOUNTER — Telehealth: Payer: Self-pay | Admitting: Family Medicine

## 2018-05-17 DIAGNOSIS — C61 Malignant neoplasm of prostate: Secondary | ICD-10-CM

## 2018-05-17 NOTE — Telephone Encounter (Signed)
Ok to order 

## 2018-05-17 NOTE — Telephone Encounter (Signed)
Patient returning a call to Pricilla Holm. States that he had a voicemail but could not understand what was being said. Please advise. CB#: [phone]

## 2018-05-17 NOTE — Telephone Encounter (Signed)
Order placed.  I called the pt and left a detailed message at his home number with this info.

## 2018-05-17 NOTE — Telephone Encounter (Signed)
I called the pt and informed him of the message below.  Lab appt scheduled for 8/5 at 10:15am.

## 2018-05-17 NOTE — Telephone Encounter (Signed)
Copied from [place] [id number]. Topic: General - Other >> May 17, 2018  9:51 AM Carolyn Stare wrote:  Pt would like an order placed for a PSA test he has a coumd appt at 10:30 on 05/21/18 and is asking if he can have the test done then

## 2018-05-17 NOTE — Telephone Encounter (Signed)
I tried to reach pt and no answer.

## 2018-05-21 ENCOUNTER — Ambulatory Visit (INDEPENDENT_AMBULATORY_CARE_PROVIDER_SITE_OTHER): Payer: Medicare Other | Admitting: General Practice

## 2018-05-21 ENCOUNTER — Other Ambulatory Visit (INDEPENDENT_AMBULATORY_CARE_PROVIDER_SITE_OTHER): Payer: Medicare Other

## 2018-05-21 DIAGNOSIS — C61 Malignant neoplasm of prostate: Secondary | ICD-10-CM | POA: Diagnosis not present

## 2018-05-21 DIAGNOSIS — Z7901 Long term (current) use of anticoagulants: Secondary | ICD-10-CM

## 2018-05-21 DIAGNOSIS — I4891 Unspecified atrial fibrillation: Secondary | ICD-10-CM

## 2018-05-21 LAB — PSA: PSA: 0.79 ng/mL (ref 0.10–4.00)

## 2018-05-21 LAB — POCT INR: INR: 2.8 (ref 2.0–3.0)

## 2018-05-21 NOTE — Patient Instructions (Addendum)
Pre visit review using our clinic review tool, if applicable. No additional management support is needed unless otherwise documented below in the visit note.  Continue to take 1 tablet daily except 2 tablets every Monday. Re-check in 6 weeks.

## 2018-06-11 ENCOUNTER — Ambulatory Visit (INDEPENDENT_AMBULATORY_CARE_PROVIDER_SITE_OTHER): Payer: Medicare Other | Admitting: Family Medicine

## 2018-06-11 ENCOUNTER — Encounter: Payer: Self-pay | Admitting: Family Medicine

## 2018-06-11 VITALS — BP 120/58 | HR 59 | Temp 97.6°F | Wt 190.6 lb

## 2018-06-11 DIAGNOSIS — R202 Paresthesia of skin: Secondary | ICD-10-CM | POA: Diagnosis not present

## 2018-06-11 NOTE — Patient Instructions (Signed)
Carpal Tunnel Syndrome Carpal tunnel syndrome is a condition that causes pain in your hand and arm. The carpal tunnel is a narrow area located on the palm side of your wrist. Repeated wrist motion or certain diseases may cause swelling within the tunnel. This swelling pinches the main nerve in the wrist (median nerve). What are the causes? This condition may be caused by:  Repeated wrist motions.  Wrist injuries.  Arthritis.  A cyst or tumor in the carpal tunnel.  Fluid buildup during pregnancy.  Sometimes the cause of this condition is not known. What increases the risk? This condition is more likely to develop in:  People who have jobs that cause them to repeatedly move their wrists in the same motion, such as Art gallery manager.  Women.  People with certain conditions, such as: ? Diabetes. ? Obesity. ? An underactive thyroid (hypothyroidism). ? Kidney failure.  What are the signs or symptoms? Symptoms of this condition include:  A tingling feeling in your fingers, especially in your thumb, index, and middle fingers.  Tingling or numbness in your hand.  An aching feeling in your entire arm, especially when your wrist and elbow are bent for long periods of time.  Wrist pain that goes up your arm to your shoulder.  Pain that goes down into your palm or fingers.  A weak feeling in your hands. You may have trouble grabbing and holding items.  Your symptoms may feel worse during the night. How is this diagnosed? This condition is diagnosed with a medical history and physical exam. You may also have tests, including:  An electromyogram (EMG). This test measures electrical signals sent by your nerves into the muscles.  X-rays.  How is this treated? Treatment for this condition includes:  Lifestyle changes. It is important to stop doing or modify the activity that caused your condition.  Physical or occupational therapy.  Medicines for pain and inflammation.  This may include medicine that is injected into your wrist.  A wrist splint.  Surgery.  Follow these instructions at home: If you have a splint:  Wear it as told by your health care provider. Remove it only as told by your health care provider.  Loosen the splint if your fingers become numb and tingle, or if they turn cold and blue.  Keep the splint clean and dry. General instructions  Take over-the-counter and prescription medicines only as told by your health care provider.  Rest your wrist from any activity that may be causing your pain. If your condition is work related, talk to your employer about changes that can be made, such as getting a wrist pad to use while typing.  If directed, apply ice to the painful area: ? Put ice in a plastic bag. ? Place a towel between your skin and the bag. ? Leave the ice on for 20 minutes, 2-3 times per day.  Keep all follow-up visits as told by your health care provider. This is important.  Do any exercises as told by your health care provider, physical therapist, or occupational therapist. Contact a health care provider if:  You have new symptoms.  Your pain is not controlled with medicines.  Your symptoms get worse. This information is not intended to replace advice given to you by your health care provider. Make sure you discuss any questions you have with your health care provider. Document Released: 09/30/2000 Document Revised: 02/11/2016 Document Reviewed: 02/18/2015 Elsevier Interactive Patient Education  2018 [employer].  Comptroller  splints EVERY NIGHT for 3 weeks. If no better after that let me know.   We will set up nerve conduction tests if not better in 3 weeks.

## 2018-06-11 NOTE — Progress Notes (Signed)
  Subjective:     Patient ID: Mark Wu, male   DOB: [birth year], 84 y.o.   MRN: [id number]  HPI Seen with intermittent paresthesias involving both hands. Was seen for this previously and we have recommended wrist splints but apparently has not been wearing these and has never consistently according to his wife. He has numbness that lasts throughout the day with somewhat intermittent, occasional fleeting sharp pains that radiate from his wrist into his hand. Sparing of his fifth digits. No feet involvement. right hand dominant.  Occasional weakness with gripping. He had normal TSH a year ago and normal B12 level this past January. No history of diabetes. No recent neck pains.  Past Medical History:  Diagnosis Date  . Anal fissure 06/11/2009  . Atrial fibrillation ([place]) 06/11/2009  . HYPERTENSION 06/11/2009  . NECK MASS 10/01/2010  . Prostate cancer [facility])    Past Surgical History:  Procedure Laterality Date  . CYSTOSCOPY  2000   hand  . PROSTATE BIOPSY      reports that he quit smoking about 26 years ago. His smoking use included cigars. He quit after 50.00 years of use. He has never used smokeless tobacco. He reports that he does not drink alcohol or use drugs. family history includes Cancer in his brother; Cancer (age of onset: 34) in his sister. No Known Allergies   Review of Systems  Respiratory: Negative for shortness of breath.   Cardiovascular: Negative for chest pain.  Neurological: Positive for numbness. Negative for dizziness.       See history of present illness       Objective:   Physical Exam  Constitutional: He is oriented to person, place, and time. He appears well-developed and well-nourished.  HENT:  Right Ear: External ear normal.  Left Ear: External ear normal.  Mouth/Throat: Oropharynx is clear and moist.  Eyes: Pupils are equal, round, and reactive to light.  Neck: Neck supple. No thyromegaly present.  Cardiovascular: Normal rate and regular rhythm.   Pulmonary/Chest: Effort normal and breath sounds normal. No respiratory distress. He has no wheezes. He has no rales.  Musculoskeletal: He exhibits no edema.  Neurological: He is alert and oriented to person, place, and time.  Symmetric upper extremity reflexes  Slight impairment with monofilament testing involving right and left index finger. He has some mild subjective impairment all digits except for the fifth digit on both hands       Assessment:     Bilateral hand paresthesias with occasional sharp fleeting pains. Suspect carpal tunnel syndrome    Plan:     -stressed importance of wrist splints nightly and consistently -if not seeing improvement in 3 weeks he will let me know and will set up nerve conduction velocities  [name] MD [facility] at [facility]

## 2018-07-02 ENCOUNTER — Ambulatory Visit (INDEPENDENT_AMBULATORY_CARE_PROVIDER_SITE_OTHER): Payer: Medicare Other | Admitting: General Practice

## 2018-07-02 DIAGNOSIS — Z7901 Long term (current) use of anticoagulants: Secondary | ICD-10-CM | POA: Diagnosis not present

## 2018-07-02 LAB — POCT INR: INR: 2.4 (ref 2.0–3.0)

## 2018-07-02 NOTE — Patient Instructions (Addendum)
Pre visit review using our clinic review tool, if applicable. No additional management support is needed unless otherwise documented below in the visit note.  Continue to take 1 tablet daily except 2 tablets every Monday. Re-check in 6 weeks. 

## 2018-07-03 DIAGNOSIS — H35052 Retinal neovascularization, unspecified, left eye: Secondary | ICD-10-CM | POA: Diagnosis not present

## 2018-07-03 DIAGNOSIS — H3562 Retinal hemorrhage, left eye: Secondary | ICD-10-CM | POA: Diagnosis not present

## 2018-07-03 DIAGNOSIS — H353123 Nonexudative age-related macular degeneration, left eye, advanced atrophic without subfoveal involvement: Secondary | ICD-10-CM | POA: Diagnosis not present

## 2018-07-03 DIAGNOSIS — H35352 Cystoid macular degeneration, left eye: Secondary | ICD-10-CM | POA: Diagnosis not present

## 2018-07-03 DIAGNOSIS — H353221 Exudative age-related macular degeneration, left eye, with active choroidal neovascularization: Secondary | ICD-10-CM | POA: Diagnosis not present

## 2018-07-09 DIAGNOSIS — B9620 Unspecified Escherichia coli [E. coli] as the cause of diseases classified elsewhere: Secondary | ICD-10-CM | POA: Diagnosis not present

## 2018-07-09 DIAGNOSIS — R8290 Unspecified abnormal findings in urine: Secondary | ICD-10-CM | POA: Diagnosis not present

## 2018-07-09 DIAGNOSIS — N390 Urinary tract infection, site not specified: Secondary | ICD-10-CM | POA: Diagnosis not present

## 2018-07-09 DIAGNOSIS — C61 Malignant neoplasm of prostate: Secondary | ICD-10-CM | POA: Diagnosis not present

## 2018-07-26 DIAGNOSIS — Z23 Encounter for immunization: Secondary | ICD-10-CM | POA: Diagnosis not present

## 2018-08-09 ENCOUNTER — Telehealth: Payer: Self-pay | Admitting: Family Medicine

## 2018-08-09 NOTE — Telephone Encounter (Signed)
Copied from [place] [id number]. Topic: Quick Communication - See Telephone Encounter >> Aug 09, 2018  9:10 AM Ahmed Prima L wrote: CRM for notification. See Telephone encounter for: 08/09/18.  Patient states he has two pills left on his metoprolol succinate (TOPROL XL) 50 MG 24 hr tablet and [facility]. He would like this to be sent to [place], [address] [address]  He said he wants 30 day supply for this one time. He said he will [facility] for another refill for next time. Please advise.

## 2018-08-09 NOTE — Telephone Encounter (Signed)
Last Rx 01/23/18 Last OV 06/11/18

## 2018-08-10 ENCOUNTER — Other Ambulatory Visit: Payer: Self-pay

## 2018-08-10 MED ORDER — METOPROLOL SUCCINATE ER 50 MG PO TB24: 50.0000 mg | ORAL_TABLET | Freq: Every day | ORAL | 0 refills | Status: DC

## 2018-08-10 NOTE — Telephone Encounter (Signed)
Medication has been sent for 30 days to the [facility].

## 2018-08-13 ENCOUNTER — Ambulatory Visit (INDEPENDENT_AMBULATORY_CARE_PROVIDER_SITE_OTHER): Payer: Medicare Other | Admitting: General Practice

## 2018-08-13 DIAGNOSIS — Z7901 Long term (current) use of anticoagulants: Secondary | ICD-10-CM | POA: Diagnosis not present

## 2018-08-13 DIAGNOSIS — I4891 Unspecified atrial fibrillation: Secondary | ICD-10-CM

## 2018-08-13 LAB — POCT INR: INR: 2.8 (ref 2.0–3.0)

## 2018-08-13 NOTE — Patient Instructions (Addendum)
Pre visit review using our clinic review tool, if applicable. No additional management support is needed unless otherwise documented below in the visit note.  Continue to take 1 tablet daily except 2 tablets every Monday. Re-check in 6 weeks. 

## 2018-08-20 ENCOUNTER — Other Ambulatory Visit: Payer: Self-pay

## 2018-08-20 MED ORDER — METOPROLOL SUCCINATE ER 50 MG PO TB24: 50.0000 mg | ORAL_TABLET | Freq: Every day | ORAL | 3 refills | Status: DC

## 2018-09-11 ENCOUNTER — Other Ambulatory Visit: Payer: Self-pay | Admitting: Family Medicine

## 2018-09-11 DIAGNOSIS — H35352 Cystoid macular degeneration, left eye: Secondary | ICD-10-CM | POA: Diagnosis not present

## 2018-09-11 DIAGNOSIS — H353123 Nonexudative age-related macular degeneration, left eye, advanced atrophic without subfoveal involvement: Secondary | ICD-10-CM | POA: Diagnosis not present

## 2018-09-11 DIAGNOSIS — H35052 Retinal neovascularization, unspecified, left eye: Secondary | ICD-10-CM | POA: Diagnosis not present

## 2018-09-11 DIAGNOSIS — H353212 Exudative age-related macular degeneration, right eye, with inactive choroidal neovascularization: Secondary | ICD-10-CM | POA: Diagnosis not present

## 2018-09-11 DIAGNOSIS — H353221 Exudative age-related macular degeneration, left eye, with active choroidal neovascularization: Secondary | ICD-10-CM | POA: Diagnosis not present

## 2018-09-24 ENCOUNTER — Ambulatory Visit (INDEPENDENT_AMBULATORY_CARE_PROVIDER_SITE_OTHER): Payer: Medicare Other | Admitting: General Practice

## 2018-09-24 DIAGNOSIS — I4891 Unspecified atrial fibrillation: Secondary | ICD-10-CM

## 2018-09-24 DIAGNOSIS — Z7901 Long term (current) use of anticoagulants: Secondary | ICD-10-CM

## 2018-09-24 LAB — POCT INR: INR: 2.5 (ref 2.0–3.0)

## 2018-09-24 NOTE — Patient Instructions (Addendum)
Pre visit review using our clinic review tool, if applicable. No additional management support is needed unless otherwise documented below in the visit note.  Continue to take 1 tablet daily except 2 tablets every Monday. Re-check in 6 weeks. 

## 2018-10-02 ENCOUNTER — Other Ambulatory Visit: Payer: Self-pay | Admitting: Family Medicine

## 2018-10-02 DIAGNOSIS — Z7901 Long term (current) use of anticoagulants: Secondary | ICD-10-CM

## 2018-10-02 NOTE — Telephone Encounter (Signed)
Coumadin patient. Thanks!

## 2018-10-12 ENCOUNTER — Other Ambulatory Visit: Payer: Self-pay

## 2018-10-12 ENCOUNTER — Encounter: Payer: Self-pay | Admitting: Family Medicine

## 2018-10-12 ENCOUNTER — Ambulatory Visit (INDEPENDENT_AMBULATORY_CARE_PROVIDER_SITE_OTHER): Payer: Medicare Other | Admitting: Family Medicine

## 2018-10-12 VITALS — BP 124/62 | HR 59 | Temp 97.7°F | Wt 191.3 lb

## 2018-10-12 DIAGNOSIS — R05 Cough: Secondary | ICD-10-CM

## 2018-10-12 DIAGNOSIS — R059 Cough, unspecified: Secondary | ICD-10-CM

## 2018-10-12 MED ORDER — DOXYCYCLINE HYCLATE 100 MG PO CAPS
100.0000 mg | ORAL_CAPSULE | Freq: Two times a day (BID) | ORAL | 0 refills | Status: DC
Start: 2018-10-12 — End: 2019-07-03

## 2018-10-12 NOTE — Patient Instructions (Signed)
Follow up for any fever or increased shortness of breath. 

## 2018-10-12 NOTE — Progress Notes (Signed)
  Subjective:     Patient ID: Mark Wu, male   DOB: [birth year], 84 y.o.   MRN: [id number]  HPI Patient seen as a work in with cough for about the past week or so.  Slightly worse past couple days.  No definite fever.  Cough productive of thick green sputum.  Patient has chronic problems including hypertension and chronic atrial fibrillation.  Non-smoker.  Denies any dyspnea.  No nausea or vomiting.  Denies any facial pain.  Does have some sinus congestion  Past Medical History:  Diagnosis Date  . Anal fissure 06/11/2009  . Atrial fibrillation ([place]) 06/11/2009  . HYPERTENSION 06/11/2009  . NECK MASS 10/01/2010  . Prostate cancer [facility])    Past Surgical History:  Procedure Laterality Date  . CYSTOSCOPY  2000   hand  . PROSTATE BIOPSY      reports that he quit smoking about 27 years ago. His smoking use included cigars. He quit after 50.00 years of use. He has never used smokeless tobacco. He reports that he does not drink alcohol or use drugs. family history includes Cancer in his brother; Cancer (age of onset: 74) in his sister. No Known Allergies   Review of Systems  Constitutional: Negative for chills and fever.  HENT: Positive for congestion. Negative for sore throat.   Respiratory: Positive for cough. Negative for shortness of breath and wheezing.   Cardiovascular: Negative for chest pain.       Objective:   Physical Exam Constitutional:      Appearance: Normal appearance.  HENT:     Right Ear: Tympanic membrane normal.     Left Ear: Tympanic membrane normal.     Mouth/Throat:     Pharynx: Oropharynx is clear.  Cardiovascular:     Rate and Rhythm: Normal rate.  Pulmonary:     Effort: Pulmonary effort is normal.     Breath sounds: Normal breath sounds. No wheezing or rales.  Neurological:     Mental Status: He is alert.        Assessment:     Cough.  This may all be viral but patient has increased risk of complication with his age.  He has felt somewhat worse  past couple days but has no fever.  No respiratory distress.    Plan:     -We elected to go and start doxycycline 100 mg twice daily for 7 days. -Consider Mucinex twice daily and stay well-hydrated -Follow-up promptly for any fever, increased shortness of breath, or other concerns  [name] MD [facility] at [facility]

## 2018-11-05 ENCOUNTER — Ambulatory Visit (INDEPENDENT_AMBULATORY_CARE_PROVIDER_SITE_OTHER): Payer: Medicare Other | Admitting: General Practice

## 2018-11-05 DIAGNOSIS — I4891 Unspecified atrial fibrillation: Secondary | ICD-10-CM

## 2018-11-05 DIAGNOSIS — Z7901 Long term (current) use of anticoagulants: Secondary | ICD-10-CM

## 2018-11-05 LAB — POCT INR: INR: 2.7 (ref 2.0–3.0)

## 2018-11-05 NOTE — Patient Instructions (Addendum)
Pre visit review using our clinic review tool, if applicable. No additional management support is needed unless otherwise documented below in the visit note.  Continue to take 1 tablet daily except 2 tablets every Monday. Re-check in 6 weeks. 

## 2018-11-20 DIAGNOSIS — H353212 Exudative age-related macular degeneration, right eye, with inactive choroidal neovascularization: Secondary | ICD-10-CM | POA: Diagnosis not present

## 2018-11-20 DIAGNOSIS — H4052X2 Glaucoma secondary to other eye disorders, left eye, moderate stage: Secondary | ICD-10-CM | POA: Diagnosis not present

## 2018-11-20 DIAGNOSIS — H35352 Cystoid macular degeneration, left eye: Secondary | ICD-10-CM | POA: Diagnosis not present

## 2018-11-20 DIAGNOSIS — H35052 Retinal neovascularization, unspecified, left eye: Secondary | ICD-10-CM | POA: Diagnosis not present

## 2018-11-20 DIAGNOSIS — H353221 Exudative age-related macular degeneration, left eye, with active choroidal neovascularization: Secondary | ICD-10-CM | POA: Diagnosis not present

## 2018-12-06 ENCOUNTER — Telehealth: Payer: Self-pay | Admitting: *Deleted

## 2018-12-06 NOTE — Telephone Encounter (Signed)
Copied from [place] [id number]. Topic: Appointment Scheduling - Scheduling Inquiry for Clinic >> Dec 06, 2018  2:52 PM Bea Graff, NT wrote: Reason for CRM: Pt would like to be scheduled for a PSA check.

## 2018-12-07 NOTE — Telephone Encounter (Signed)
OK 

## 2018-12-07 NOTE — Telephone Encounter (Signed)
OK to order 

## 2018-12-10 ENCOUNTER — Other Ambulatory Visit: Payer: Self-pay

## 2018-12-10 DIAGNOSIS — C61 Malignant neoplasm of prostate: Secondary | ICD-10-CM

## 2018-12-10 NOTE — Telephone Encounter (Signed)
PSA has been ordered and he has a lab appointment on 12/17/18 at 10:45am. Patient is aware.

## 2018-12-17 ENCOUNTER — Ambulatory Visit: Payer: Medicare Other

## 2018-12-17 ENCOUNTER — Ambulatory Visit (INDEPENDENT_AMBULATORY_CARE_PROVIDER_SITE_OTHER): Payer: Medicare Other | Admitting: General Practice

## 2018-12-17 DIAGNOSIS — I4891 Unspecified atrial fibrillation: Secondary | ICD-10-CM | POA: Diagnosis not present

## 2018-12-17 DIAGNOSIS — C61 Malignant neoplasm of prostate: Secondary | ICD-10-CM

## 2018-12-17 DIAGNOSIS — Z7901 Long term (current) use of anticoagulants: Secondary | ICD-10-CM

## 2018-12-17 LAB — POCT INR: INR: 2.6 (ref 2.0–3.0)

## 2018-12-17 LAB — PSA: PSA: 1.03 ng/mL (ref 0.10–4.00)

## 2018-12-17 NOTE — Patient Instructions (Addendum)
Pre visit review using our clinic review tool, if applicable. No additional management support is needed unless otherwise documented below in the visit note.  Continue to take 1 tablet daily except 2 tablets every Monday. Re-check in 6 weeks. 

## 2018-12-21 ENCOUNTER — Ambulatory Visit: Payer: Medicare Other

## 2019-01-28 ENCOUNTER — Other Ambulatory Visit: Payer: Self-pay

## 2019-01-28 ENCOUNTER — Ambulatory Visit (INDEPENDENT_AMBULATORY_CARE_PROVIDER_SITE_OTHER): Payer: Medicare Other | Admitting: General Practice

## 2019-01-28 DIAGNOSIS — Z7901 Long term (current) use of anticoagulants: Secondary | ICD-10-CM | POA: Diagnosis not present

## 2019-01-28 DIAGNOSIS — I4891 Unspecified atrial fibrillation: Secondary | ICD-10-CM

## 2019-01-28 LAB — POCT INR: INR: 2.5 (ref 2.0–3.0)

## 2019-01-28 NOTE — Patient Instructions (Incomplete)
Pre visit review using our clinic review tool, if applicable. No additional management support is needed unless otherwise documented below in the visit note.  Continue to take 1 tablet daily except 2 tablets every Monday. Re-check in 6 weeks. 

## 2019-01-29 DIAGNOSIS — H353212 Exudative age-related macular degeneration, right eye, with inactive choroidal neovascularization: Secondary | ICD-10-CM | POA: Diagnosis not present

## 2019-01-29 DIAGNOSIS — H353123 Nonexudative age-related macular degeneration, left eye, advanced atrophic without subfoveal involvement: Secondary | ICD-10-CM | POA: Diagnosis not present

## 2019-01-29 DIAGNOSIS — H353221 Exudative age-related macular degeneration, left eye, with active choroidal neovascularization: Secondary | ICD-10-CM | POA: Diagnosis not present

## 2019-02-01 ENCOUNTER — Telehealth: Payer: Self-pay | Admitting: *Deleted

## 2019-02-01 NOTE — Telephone Encounter (Signed)
Called to schedule awv message left

## 2019-02-04 DIAGNOSIS — C61 Malignant neoplasm of prostate: Secondary | ICD-10-CM | POA: Diagnosis not present

## 2019-03-04 ENCOUNTER — Other Ambulatory Visit: Payer: Self-pay

## 2019-03-04 ENCOUNTER — Ambulatory Visit (INDEPENDENT_AMBULATORY_CARE_PROVIDER_SITE_OTHER): Payer: Medicare Other | Admitting: General Practice

## 2019-03-04 DIAGNOSIS — I4891 Unspecified atrial fibrillation: Secondary | ICD-10-CM

## 2019-03-04 DIAGNOSIS — Z7901 Long term (current) use of anticoagulants: Secondary | ICD-10-CM | POA: Diagnosis not present

## 2019-03-04 LAB — POCT INR: INR: 2.4 (ref 2.0–3.0)

## 2019-03-04 NOTE — Patient Instructions (Signed)
Pre visit review using our clinic review tool, if applicable. No additional management support is needed unless otherwise documented below in the visit note.  Continue to take 1 tablet daily except 2 tablets every Monday. Re-check in 6 weeks. 

## 2019-04-01 DIAGNOSIS — L918 Other hypertrophic disorders of the skin: Secondary | ICD-10-CM | POA: Diagnosis not present

## 2019-04-01 DIAGNOSIS — Z1283 Encounter for screening for malignant neoplasm of skin: Secondary | ICD-10-CM | POA: Diagnosis not present

## 2019-04-01 DIAGNOSIS — D225 Melanocytic nevi of trunk: Secondary | ICD-10-CM | POA: Diagnosis not present

## 2019-04-04 ENCOUNTER — Telehealth: Payer: Self-pay | Admitting: Family Medicine

## 2019-04-04 NOTE — Telephone Encounter (Signed)
Patient is concerned about "slightly elevated BP readings.  169/76  He is requesting advice on what he needs to do.

## 2019-04-05 NOTE — Telephone Encounter (Signed)
Called patient and NOT ABLE to LMOVM to return call  [place] for [facility] to Discuss results / PCP / recommendations / Schedule patient  I will try back again soon

## 2019-04-05 NOTE — Telephone Encounter (Signed)
Called patient and LMOVM to return call  [place] for [facility] to Discuss results / PCP / recommendations / Schedule patient  Left a detailed voice message for patient to call back to schedule a telephone visit with Dr. Elease Hashimoto and to make sure he is drinking plenty of water to help his BP.  CRM Created.

## 2019-04-09 DIAGNOSIS — H4052X2 Glaucoma secondary to other eye disorders, left eye, moderate stage: Secondary | ICD-10-CM | POA: Diagnosis not present

## 2019-04-09 DIAGNOSIS — H35352 Cystoid macular degeneration, left eye: Secondary | ICD-10-CM | POA: Diagnosis not present

## 2019-04-09 DIAGNOSIS — H353212 Exudative age-related macular degeneration, right eye, with inactive choroidal neovascularization: Secondary | ICD-10-CM | POA: Diagnosis not present

## 2019-04-09 DIAGNOSIS — H353221 Exudative age-related macular degeneration, left eye, with active choroidal neovascularization: Secondary | ICD-10-CM | POA: Diagnosis not present

## 2019-04-09 DIAGNOSIS — H35052 Retinal neovascularization, unspecified, left eye: Secondary | ICD-10-CM | POA: Diagnosis not present

## 2019-04-15 ENCOUNTER — Other Ambulatory Visit: Payer: Self-pay

## 2019-04-15 ENCOUNTER — Ambulatory Visit (INDEPENDENT_AMBULATORY_CARE_PROVIDER_SITE_OTHER): Payer: Medicare Other | Admitting: General Practice

## 2019-04-15 DIAGNOSIS — I4891 Unspecified atrial fibrillation: Secondary | ICD-10-CM

## 2019-04-15 DIAGNOSIS — Z7901 Long term (current) use of anticoagulants: Secondary | ICD-10-CM | POA: Diagnosis not present

## 2019-04-15 LAB — POCT INR: INR: 2.5 (ref 2.0–3.0)

## 2019-04-15 NOTE — Patient Instructions (Signed)
Pre visit review using our clinic review tool, if applicable. No additional management support is needed unless otherwise documented below in the visit note. 

## 2019-04-23 NOTE — Telephone Encounter (Signed)
Called patient and he stated that his BP is now doing fine and he will schedule an appointment when he needs refills on his medications.

## 2019-05-23 DIAGNOSIS — D23112 Other benign neoplasm of skin of right lower eyelid, including canthus: Secondary | ICD-10-CM | POA: Diagnosis not present

## 2019-05-23 DIAGNOSIS — H353114 Nonexudative age-related macular degeneration, right eye, advanced atrophic with subfoveal involvement: Secondary | ICD-10-CM | POA: Diagnosis not present

## 2019-05-23 DIAGNOSIS — H43811 Vitreous degeneration, right eye: Secondary | ICD-10-CM | POA: Diagnosis not present

## 2019-05-23 DIAGNOSIS — Z961 Presence of intraocular lens: Secondary | ICD-10-CM | POA: Diagnosis not present

## 2019-05-23 DIAGNOSIS — D23111 Other benign neoplasm of skin of right upper eyelid, including canthus: Secondary | ICD-10-CM | POA: Diagnosis not present

## 2019-05-23 DIAGNOSIS — H353221 Exudative age-related macular degeneration, left eye, with active choroidal neovascularization: Secondary | ICD-10-CM | POA: Diagnosis not present

## 2019-05-27 ENCOUNTER — Other Ambulatory Visit: Payer: Self-pay

## 2019-05-27 ENCOUNTER — Ambulatory Visit (INDEPENDENT_AMBULATORY_CARE_PROVIDER_SITE_OTHER): Payer: Medicare Other | Admitting: General Practice

## 2019-05-27 DIAGNOSIS — Z7901 Long term (current) use of anticoagulants: Secondary | ICD-10-CM

## 2019-05-27 LAB — POCT INR: INR: 2.6 (ref 2.0–3.0)

## 2019-05-27 NOTE — Patient Instructions (Addendum)
Pre visit review using our clinic review tool, if applicable. No additional management support is needed unless otherwise documented below in the visit note.  Continue to take 1 tablet daily except 2 tablets every Monday. Re-check in 6 weeks. 

## 2019-06-07 NOTE — Progress Notes (Signed)
Agree with coumadin management.  TRUE Garciamartinez W Torrell Krutz MD  [facility] at [facility]  

## 2019-06-18 DIAGNOSIS — H353212 Exudative age-related macular degeneration, right eye, with inactive choroidal neovascularization: Secondary | ICD-10-CM | POA: Diagnosis not present

## 2019-06-18 DIAGNOSIS — H353221 Exudative age-related macular degeneration, left eye, with active choroidal neovascularization: Secondary | ICD-10-CM | POA: Diagnosis not present

## 2019-06-18 DIAGNOSIS — H35352 Cystoid macular degeneration, left eye: Secondary | ICD-10-CM | POA: Diagnosis not present

## 2019-07-03 ENCOUNTER — Other Ambulatory Visit: Payer: Self-pay

## 2019-07-03 ENCOUNTER — Ambulatory Visit (INDEPENDENT_AMBULATORY_CARE_PROVIDER_SITE_OTHER): Payer: Medicare Other | Admitting: Family Medicine

## 2019-07-03 ENCOUNTER — Encounter: Payer: Self-pay | Admitting: Family Medicine

## 2019-07-03 VITALS — BP 130/82 | HR 52 | Temp 97.5°F | Ht 68.0 in | Wt 187.7 lb

## 2019-07-03 DIAGNOSIS — Z7901 Long term (current) use of anticoagulants: Secondary | ICD-10-CM

## 2019-07-03 DIAGNOSIS — Z23 Encounter for immunization: Secondary | ICD-10-CM | POA: Diagnosis not present

## 2019-07-03 DIAGNOSIS — R0789 Other chest pain: Secondary | ICD-10-CM

## 2019-07-03 DIAGNOSIS — I10 Essential (primary) hypertension: Secondary | ICD-10-CM | POA: Diagnosis not present

## 2019-07-03 DIAGNOSIS — R001 Bradycardia, unspecified: Secondary | ICD-10-CM

## 2019-07-03 DIAGNOSIS — I4891 Unspecified atrial fibrillation: Secondary | ICD-10-CM

## 2019-07-03 MED ORDER — CLOTRIMAZOLE-BETAMETHASONE 1-0.05 % EX CREA: TOPICAL_CREAM | CUTANEOUS | 1 refills | Status: DC

## 2019-07-03 MED ORDER — COUMADIN 2.5 MG PO TABS: ORAL_TABLET | ORAL | 4 refills | Status: DC

## 2019-07-03 NOTE — Addendum Note (Signed)
Addended by: Anibal Henderson on: 07/03/2019 09:03 AM   Modules accepted: Orders

## 2019-07-03 NOTE — Progress Notes (Signed)
  Subjective:     Patient ID: Mark Wu, male   DOB: [birth year], 85 y.o.   MRN: [id number]  HPI Patient 85 year old with history of hypertension, atrial fibrillation, remote history of prostate cancer.  He stays generally very active.  Has had some recent episodes of very sharp we describes as a "ping-like" pain which apparently is very transient lasting just a few seconds-and occur at rest..  This is left chest location without radiation.  He had a couple episodes where he felt a fullness in his chest after eating a heavy meal.  He walks about 100 yards to his mailbox and has never had any chest discomfort with walking or things like weed eating.  No dizziness.  No syncope.  No dyspnea.  He has chronic A. fib/flutter and has had this for many years.  Is on Coumadin.  He takes amlodipine and also on metoprolol 50 mg daily.  Past Medical History:  Diagnosis Date  . Anal fissure 06/11/2009  . Atrial fibrillation ([place]) 06/11/2009  . HYPERTENSION 06/11/2009  . NECK MASS 10/01/2010  . Prostate cancer [facility])    Past Surgical History:  Procedure Laterality Date  . CYSTOSCOPY  2000   hand  . PROSTATE BIOPSY      reports that he quit smoking about 27 years ago. His smoking use included cigars. He quit after 50.00 years of use. He has never used smokeless tobacco. He reports that he does not drink alcohol or use drugs. family history includes Cancer in his brother; Cancer (age of onset: 22) in his sister. No Known Allergies   Review of Systems  Constitutional: Negative for appetite change, chills, fever and unexpected weight change.  Respiratory: Negative for cough and shortness of breath.   Cardiovascular: Positive for chest pain. Negative for palpitations and leg swelling.  Gastrointestinal: Negative for abdominal pain.  Genitourinary: Negative for dysuria.  Neurological: Negative for dizziness and syncope.  Psychiatric/Behavioral: Negative for confusion.       Objective:   Physical  Exam Constitutional:      Appearance: Normal appearance.  Neck:     Musculoskeletal: Neck supple.  Cardiovascular:     Rate and Rhythm: Regular rhythm.     Comments: Bradycardic with auscultated and palpated pulse around 40. Pulmonary:     Effort: Pulmonary effort is normal.     Breath sounds: Normal breath sounds.  Musculoskeletal:     Right lower leg: No edema.     Left lower leg: No edema.  Lymphadenopathy:     Cervical: No cervical adenopathy.  Neurological:     Mental Status: He is alert.        Assessment:     #1 atypical chest symptoms.  He has had some very fleeting sharp pains lasting just a few seconds.  Very atypical.  No exertional symptoms.  EKG shows a flutter/fib with slow ventricular rate of 40.  No other acute changes.  #2 longstanding history of A. fib/flutter.  Patient on Coumadin.  Bradycardic as above but asymptomatic otherwise in terms of dizziness or syncope  #3 hypertension stable and at goal    Plan:     -Reduce Toprol-XL to 25 mg daily and recommend follow-up next Monday to reassess -Follow-up immediately for any exertional chest discomfort, dizziness, or other concerns -He will be getting his Coumadin rechecked on Monday at follow-up -Flu vaccine given  [name] MD [facility] at [facility]

## 2019-07-03 NOTE — Patient Instructions (Signed)
REDUCE THE TOPROL XL TO ONE HALF TABLET DAILY  LET SEE YOU BACK NEXT Monday AT 11:15 AM

## 2019-07-08 ENCOUNTER — Encounter: Payer: Self-pay | Admitting: Family Medicine

## 2019-07-08 ENCOUNTER — Ambulatory Visit (INDEPENDENT_AMBULATORY_CARE_PROVIDER_SITE_OTHER): Payer: Medicare Other | Admitting: General Practice

## 2019-07-08 ENCOUNTER — Other Ambulatory Visit: Payer: Self-pay

## 2019-07-08 ENCOUNTER — Ambulatory Visit (INDEPENDENT_AMBULATORY_CARE_PROVIDER_SITE_OTHER): Payer: Medicare Other | Admitting: Family Medicine

## 2019-07-08 VITALS — BP 134/80 | HR 48 | Temp 97.7°F | Ht 68.0 in | Wt 186.6 lb

## 2019-07-08 DIAGNOSIS — Z7901 Long term (current) use of anticoagulants: Secondary | ICD-10-CM | POA: Diagnosis not present

## 2019-07-08 DIAGNOSIS — R001 Bradycardia, unspecified: Secondary | ICD-10-CM | POA: Diagnosis not present

## 2019-07-08 DIAGNOSIS — I4891 Unspecified atrial fibrillation: Secondary | ICD-10-CM

## 2019-07-08 LAB — POCT INR: INR: 2.5 (ref 2.0–3.0)

## 2019-07-08 MED ORDER — METOPROLOL SUCCINATE ER 25 MG PO TB24: 25.0000 mg | ORAL_TABLET | Freq: Every day | ORAL | 3 refills | Status: DC

## 2019-07-08 NOTE — Patient Instructions (Signed)
Follow up for any dizziness, shortness of breath, or any recurrent chest pain.    Let me know if heart rate goes below 40.

## 2019-07-08 NOTE — Progress Notes (Signed)
  Subjective:     Patient ID: Mark Wu, male   DOB: 1926-07-30, 85 y.o.   MRN: [id number]  HPI Patient has longstanding history of A. fib/flutter.  He was seen last week with atypical chest pain.  EKG showed bradycardia with heart rate around 40.  Patient denied any dizziness, dyspnea, or syncope.  We reduce his Toprol XL 50 mg daily to 25 mg.  His pulse by home monitoring is been around 50.  He again has no dizziness today.  No chest pain whatsoever since last visit.  His chest symptoms were very atypical.  He remains on Coumadin which he has been on for many years for his A. Fib  Past Medical History:  Diagnosis Date  . Anal fissure 06/11/2009  . Atrial fibrillation ([place]) 06/11/2009  . HYPERTENSION 06/11/2009  . NECK MASS 10/01/2010  . Prostate cancer [facility])    Past Surgical History:  Procedure Laterality Date  . CYSTOSCOPY  2000   hand  . PROSTATE BIOPSY      reports that he quit smoking about 27 years ago. His smoking use included cigars. He quit after 50.00 years of use. He has never used smokeless tobacco. He reports that he does not drink alcohol or use drugs. family history includes Cancer in his brother; Cancer (age of onset: 75) in his sister. No Known Allergies   Review of Systems  Constitutional: Negative for appetite change and unexpected weight change.  Respiratory: Negative for shortness of breath.   Cardiovascular: Negative for chest pain, palpitations and leg swelling.  Neurological: Negative for dizziness, weakness and light-headedness.       Objective:   Physical Exam Constitutional:      Appearance: Normal appearance.  Cardiovascular:     Comments: Rate is palpated around 48-50.  Slightly irregular Pulmonary:     Effort: Pulmonary effort is normal.     Breath sounds: Normal breath sounds.  Musculoskeletal:     Right lower leg: No edema.     Left lower leg: No edema.  Neurological:     General: No focal deficit present.     Mental Status: He is alert.         Assessment:     A. fib/flutter.  Recent bradycardia which is basically asymptomatic.  Heart rate has improved somewhat with reduction in metoprolol    Plan:     -Continue Coumadin and continue close follow-up with Coumadin clinic -Refill Toprol-XL 25 mg daily which is a reduction from recent dose of 50.  Monitor pulse regularly and be in touch if consistently below 45 or if he develops any new symptoms such as dizziness or dyspnea -We will plan routine follow-up in 3 months and sooner as needed  [name] MD [facility] at [facility]

## 2019-07-08 NOTE — Patient Instructions (Addendum)
Pre visit review using our clinic review tool, if applicable. No additional management support is needed unless otherwise documented below in the visit note.  Continue to take 1 tablet daily except 2 tablets every Monday. Re-check in 6 weeks. 

## 2019-07-15 ENCOUNTER — Telehealth: Payer: Self-pay | Admitting: Family Medicine

## 2019-07-15 ENCOUNTER — Other Ambulatory Visit: Payer: Self-pay | Admitting: General Practice

## 2019-07-15 ENCOUNTER — Telehealth: Payer: Self-pay

## 2019-07-15 DIAGNOSIS — Z7901 Long term (current) use of anticoagulants: Secondary | ICD-10-CM

## 2019-07-15 DIAGNOSIS — C61 Malignant neoplasm of prostate: Secondary | ICD-10-CM

## 2019-07-15 MED ORDER — COUMADIN 2.5 MG PO TABS: ORAL_TABLET | ORAL | 4 refills | Status: DC

## 2019-07-15 NOTE — Telephone Encounter (Signed)
[facility]

## 2019-07-15 NOTE — Telephone Encounter (Signed)
Copied from [place] [id number]. Topic: General - Other >> Jul 15, 2019  4:16 PM Leward Quan A wrote: Reason for CRM: Patient called to inquire of Dr Elease Hashimoto can he please be scheduled for PSA testing. Can be reached at Ph# [phone]

## 2019-07-15 NOTE — Telephone Encounter (Signed)
Pt does see urology.

## 2019-07-15 NOTE — Telephone Encounter (Signed)
Pt got a call from pharmacy that his coumadin rx was cancelled. Pt saw provider on last weekend a medication change was made but was not supposed to be cancelling coumadin[facility]

## 2019-07-15 NOTE — Telephone Encounter (Signed)
I do not see where rx was cancelled. Was last sent to pharmacy 07/03/19.

## 2019-07-16 NOTE — Telephone Encounter (Signed)
I would get through urology-if he is still seeing them

## 2019-07-16 NOTE — Telephone Encounter (Signed)
Please advise. Should patient have this done here or with urology?

## 2019-07-16 NOTE — Telephone Encounter (Signed)
Patient stated it was his urologist who asked him to have Dr. Elease Hashimoto to order the labs. Orders have been okayed by Dr. Elease Hashimoto.  Orders have been placed and patient has been scheduled.

## 2019-07-17 ENCOUNTER — Other Ambulatory Visit: Payer: Self-pay

## 2019-07-17 ENCOUNTER — Other Ambulatory Visit (INDEPENDENT_AMBULATORY_CARE_PROVIDER_SITE_OTHER): Payer: Medicare Other

## 2019-07-17 DIAGNOSIS — C61 Malignant neoplasm of prostate: Secondary | ICD-10-CM | POA: Diagnosis not present

## 2019-07-17 LAB — PSA: PSA: 1.11 ng/mL (ref 0.10–4.00)

## 2019-07-18 ENCOUNTER — Other Ambulatory Visit: Payer: Self-pay | Admitting: General Practice

## 2019-07-18 DIAGNOSIS — Z7901 Long term (current) use of anticoagulants: Secondary | ICD-10-CM

## 2019-07-18 MED ORDER — WARFARIN SODIUM 2.5 MG PO TABS: ORAL_TABLET | ORAL | 1 refills | Status: DC

## 2019-07-22 ENCOUNTER — Telehealth: Payer: Self-pay

## 2019-07-22 NOTE — Telephone Encounter (Signed)
Copied from [place] [id number]. Topic: Appointment Scheduling - Scheduling Inquiry for Clinic >> Jul 12, 2019 12:42 PM Sheran Luz wrote: Patient requesting call back from Dr. Erick Blinks CMA regarding PSA lab appointment.

## 2019-08-06 ENCOUNTER — Telehealth: Payer: Self-pay | Admitting: Family Medicine

## 2019-08-06 NOTE — Telephone Encounter (Signed)
Pt wife called and needs pt's PSA results faxed to [facility]  Attn: Estrella Deeds Fax: [phone]  [phone]  Pt wife states appt is the coming Monday 10/26 and its urgent that Dr. Estill Dooms gets these results   Please advise

## 2019-08-12 DIAGNOSIS — C61 Malignant neoplasm of prostate: Secondary | ICD-10-CM | POA: Diagnosis not present

## 2019-08-19 ENCOUNTER — Ambulatory Visit (INDEPENDENT_AMBULATORY_CARE_PROVIDER_SITE_OTHER): Payer: Medicare Other | Admitting: General Practice

## 2019-08-19 ENCOUNTER — Other Ambulatory Visit: Payer: Self-pay

## 2019-08-19 DIAGNOSIS — Z7901 Long term (current) use of anticoagulants: Secondary | ICD-10-CM | POA: Diagnosis not present

## 2019-08-19 DIAGNOSIS — I4891 Unspecified atrial fibrillation: Secondary | ICD-10-CM

## 2019-08-19 LAB — POCT INR: INR: 2.7 (ref 2.0–3.0)

## 2019-08-19 NOTE — Patient Instructions (Addendum)
Pre visit review using our clinic review tool, if applicable. No additional management support is needed unless otherwise documented below in the visit note.  Continue to take 1 tablet daily except 2 tablets every Monday. Re-check in 6 weeks. 

## 2019-08-28 DIAGNOSIS — H353114 Nonexudative age-related macular degeneration, right eye, advanced atrophic with subfoveal involvement: Secondary | ICD-10-CM | POA: Diagnosis not present

## 2019-08-28 DIAGNOSIS — H35352 Cystoid macular degeneration, left eye: Secondary | ICD-10-CM | POA: Diagnosis not present

## 2019-08-28 DIAGNOSIS — H353212 Exudative age-related macular degeneration, right eye, with inactive choroidal neovascularization: Secondary | ICD-10-CM | POA: Diagnosis not present

## 2019-08-28 DIAGNOSIS — H353221 Exudative age-related macular degeneration, left eye, with active choroidal neovascularization: Secondary | ICD-10-CM | POA: Diagnosis not present

## 2019-08-28 DIAGNOSIS — H353123 Nonexudative age-related macular degeneration, left eye, advanced atrophic without subfoveal involvement: Secondary | ICD-10-CM | POA: Diagnosis not present

## 2019-09-30 ENCOUNTER — Ambulatory Visit: Payer: Medicare Other

## 2019-10-07 ENCOUNTER — Ambulatory Visit (INDEPENDENT_AMBULATORY_CARE_PROVIDER_SITE_OTHER): Payer: Medicare Other | Admitting: Family Medicine

## 2019-10-07 ENCOUNTER — Other Ambulatory Visit: Payer: Self-pay

## 2019-10-07 ENCOUNTER — Encounter: Payer: Self-pay | Admitting: Family Medicine

## 2019-10-07 ENCOUNTER — Ambulatory Visit (INDEPENDENT_AMBULATORY_CARE_PROVIDER_SITE_OTHER): Payer: Medicare Other | Admitting: General Practice

## 2019-10-07 VITALS — BP 118/64 | HR 66 | Temp 98.2°F | Ht 68.0 in | Wt 183.6 lb

## 2019-10-07 DIAGNOSIS — I10 Essential (primary) hypertension: Secondary | ICD-10-CM | POA: Diagnosis not present

## 2019-10-07 DIAGNOSIS — C61 Malignant neoplasm of prostate: Secondary | ICD-10-CM | POA: Diagnosis not present

## 2019-10-07 DIAGNOSIS — R142 Eructation: Secondary | ICD-10-CM | POA: Diagnosis not present

## 2019-10-07 DIAGNOSIS — Z7901 Long term (current) use of anticoagulants: Secondary | ICD-10-CM | POA: Diagnosis not present

## 2019-10-07 DIAGNOSIS — I4891 Unspecified atrial fibrillation: Secondary | ICD-10-CM | POA: Diagnosis not present

## 2019-10-07 LAB — POCT INR: INR: 2.4 (ref 2.0–3.0)

## 2019-10-07 NOTE — Progress Notes (Signed)
Subjective:     Patient ID: Mark Wu, male   DOB: 1926-05-18, 85 y.o.   MRN: [id number]  HPI   Mr. Geraghty is seen for medical follow-up.  He has history of hypertension, chronic atrial fibrillation, GERD, prostate cancer.  He is in process of switching to another urologist as his current urologist is leaving to move to another town.  Overall, he has been very stable.  No recent falls.  Compliant with therapy.  INR today 2.4.  No recent bleeding complications.  He also remains on metoprolol and amlodipine.  He had some recent increased burping.  He states he has a pressure sensation that is promptly relieved with burping.  No exertional chest pains.  No dyspnea.  No active GERD symptoms.  No cola or soda intake.  No gum use.   Past Medical History:  Diagnosis Date  . Anal fissure 06/11/2009  . Atrial fibrillation ([place]) 06/11/2009  . HYPERTENSION 06/11/2009  . NECK MASS 10/01/2010  . Prostate cancer [facility])    Past Surgical History:  Procedure Laterality Date  . CYSTOSCOPY  2000   hand  . PROSTATE BIOPSY      reports that he quit smoking about 28 years ago. His smoking use included cigars. He quit after 50.00 years of use. He has never used smokeless tobacco. He reports that he does not drink alcohol or use drugs. family history includes Cancer in his brother; Cancer (age of onset: 66) in his sister. No Known Allergies  Wt Readings from Last 3 Encounters:  10/07/19 183 lb 9.6 oz (83.3 kg)  07/08/19 186 lb 9.6 oz (84.6 kg)  07/03/19 187 lb 11.2 oz (85.1 kg)     Review of Systems  Constitutional: Negative for chills, fatigue and fever.  Eyes: Negative for visual disturbance.  Respiratory: Negative for cough, chest tightness and shortness of breath.   Cardiovascular: Negative for chest pain, palpitations and leg swelling.  Gastrointestinal: Negative for abdominal pain.  Genitourinary: Negative for dysuria.  Neurological: Negative for dizziness, syncope, weakness, light-headedness  and headaches.       Objective:   Physical Exam Constitutional:      Appearance: He is well-developed.  HENT:     Right Ear: External ear normal.     Left Ear: External ear normal.  Eyes:     Pupils: Pupils are equal, round, and reactive to light.  Neck:     Thyroid: No thyromegaly.  Cardiovascular:     Rate and Rhythm: Normal rate and regular rhythm.  Pulmonary:     Effort: Pulmonary effort is normal. No respiratory distress.     Breath sounds: Normal breath sounds. No wheezing or rales.  Musculoskeletal:     Cervical back: Neck supple.     Right lower leg: No edema.     Left lower leg: No edema.  Neurological:     Mental Status: He is alert and oriented to person, place, and time.        Assessment:     #1 chronic atrial fibrillation.  He does appear to be in regular rhythm today but has been in A. fib for many years  #2 hypertension stable and at goal  #3 history of prostate cancer and BPH currently symptomatically stable on Flomax  #4 increased burping.    Plan:     -Consider over-the-counter antacid such as Pepcid 20 mg twice daily -Continue current medications -Recommend follow-up labs but our lab personnel were out today.  When he returns for 3-month  follow-up recommend basic metabolic panel and CBC in addition to his INR  [name] MD [facility] at [facility]

## 2019-10-07 NOTE — Patient Instructions (Addendum)
Pre visit review using our clinic review tool, if applicable. No additional management support is needed unless otherwise documented below in the visit note.  Continue to take 1 tablet daily except 2 tablets every Monday. Re-check in 6 weeks. 

## 2019-10-07 NOTE — Patient Instructions (Addendum)
If burping persists, consider trial of over the counter Pepcid 20 mg twice daily.

## 2019-10-29 DIAGNOSIS — Z23 Encounter for immunization: Secondary | ICD-10-CM | POA: Diagnosis not present

## 2019-11-05 DIAGNOSIS — H35352 Cystoid macular degeneration, left eye: Secondary | ICD-10-CM | POA: Diagnosis not present

## 2019-11-05 DIAGNOSIS — H353221 Exudative age-related macular degeneration, left eye, with active choroidal neovascularization: Secondary | ICD-10-CM | POA: Diagnosis not present

## 2019-11-05 DIAGNOSIS — H353123 Nonexudative age-related macular degeneration, left eye, advanced atrophic without subfoveal involvement: Secondary | ICD-10-CM | POA: Diagnosis not present

## 2019-11-05 DIAGNOSIS — H353212 Exudative age-related macular degeneration, right eye, with inactive choroidal neovascularization: Secondary | ICD-10-CM | POA: Diagnosis not present

## 2019-11-07 ENCOUNTER — Other Ambulatory Visit: Payer: Self-pay | Admitting: Family Medicine

## 2019-11-18 ENCOUNTER — Ambulatory Visit (INDEPENDENT_AMBULATORY_CARE_PROVIDER_SITE_OTHER): Payer: Medicare Other | Admitting: General Practice

## 2019-11-18 ENCOUNTER — Other Ambulatory Visit: Payer: Self-pay

## 2019-11-18 DIAGNOSIS — I4891 Unspecified atrial fibrillation: Secondary | ICD-10-CM

## 2019-11-18 DIAGNOSIS — Z7901 Long term (current) use of anticoagulants: Secondary | ICD-10-CM

## 2019-11-18 LAB — POCT INR: INR: 1.8 — AB (ref 2.0–3.0)

## 2019-11-18 NOTE — Patient Instructions (Addendum)
Pre visit review using our clinic review tool, if applicable. No additional management support is needed unless otherwise documented below in the visit note.  Take 3 tablets today (2/1) and then continue to take 1 tablet daily except 2 tablets every Monday. Re-check in 4 weeks.

## 2019-11-19 ENCOUNTER — Other Ambulatory Visit: Payer: Self-pay | Admitting: General Practice

## 2019-11-19 DIAGNOSIS — Z7901 Long term (current) use of anticoagulants: Secondary | ICD-10-CM

## 2019-11-19 MED ORDER — WARFARIN SODIUM 2.5 MG PO TABS: ORAL_TABLET | ORAL | 1 refills | Status: DC

## 2019-12-09 DIAGNOSIS — Z23 Encounter for immunization: Secondary | ICD-10-CM | POA: Diagnosis not present

## 2019-12-13 ENCOUNTER — Other Ambulatory Visit: Payer: Self-pay

## 2019-12-16 ENCOUNTER — Ambulatory Visit (INDEPENDENT_AMBULATORY_CARE_PROVIDER_SITE_OTHER): Payer: Medicare Other | Admitting: General Practice

## 2019-12-16 ENCOUNTER — Other Ambulatory Visit: Payer: Self-pay

## 2019-12-16 DIAGNOSIS — Z7901 Long term (current) use of anticoagulants: Secondary | ICD-10-CM | POA: Diagnosis not present

## 2019-12-16 LAB — POCT INR: INR: 2.7 (ref 2.0–3.0)

## 2019-12-16 NOTE — Patient Instructions (Signed)
Pre visit review using our clinic review tool, if applicable. No additional management support is needed unless otherwise documented below in the visit note.  Continue to take 1 tablet daily except 2 tablets every Monday. Re-check in 4 weeks.

## 2019-12-24 ENCOUNTER — Other Ambulatory Visit: Payer: Self-pay

## 2019-12-25 ENCOUNTER — Encounter: Payer: Self-pay | Admitting: Family Medicine

## 2019-12-25 ENCOUNTER — Ambulatory Visit (INDEPENDENT_AMBULATORY_CARE_PROVIDER_SITE_OTHER): Payer: Medicare Other | Admitting: Family Medicine

## 2019-12-25 ENCOUNTER — Other Ambulatory Visit: Payer: Self-pay

## 2019-12-25 VITALS — BP 134/78 | HR 61 | Temp 97.7°F | Ht 68.0 in | Wt 181.3 lb

## 2019-12-25 DIAGNOSIS — K4090 Unilateral inguinal hernia, without obstruction or gangrene, not specified as recurrent: Secondary | ICD-10-CM | POA: Diagnosis not present

## 2019-12-25 DIAGNOSIS — I10 Essential (primary) hypertension: Secondary | ICD-10-CM | POA: Diagnosis not present

## 2019-12-25 DIAGNOSIS — K429 Umbilical hernia without obstruction or gangrene: Secondary | ICD-10-CM

## 2019-12-25 NOTE — Patient Instructions (Signed)

## 2019-12-25 NOTE — Progress Notes (Signed)
  Subjective:     Patient ID: Mark Wu, male   DOB: 03-19-1926, 86 y.o.   MRN: [id number]  HPI Mr. Elizardo is here with right inguinal bulge.  He has history of known hernia but this has gotten somewhat larger recently.  He had no pain.  He also has umbilical bulge.  No prior history of repair.  Does not do a lot of lifting and is fairly sedentary at this point.  He has history of chronic atrial fibrillation and is maintained on metoprolol and Coumadin.  He also takes amlodipine and tamsulosin.  No recent dizziness.  No chest pains.  Past Medical History:  Diagnosis Date  . Anal fissure 06/11/2009  . Atrial fibrillation ([place]) 06/11/2009  . HYPERTENSION 06/11/2009  . NECK MASS 10/01/2010  . Prostate cancer [facility])    Past Surgical History:  Procedure Laterality Date  . CARPAL TUNNEL RELEASE     Both hands  . CYST REMOVAL HAND     Left hand  . CYSTOSCOPY  2000   hand  . PROSTATE BIOPSY      reports that he quit smoking about 28 years ago. His smoking use included cigars. He quit after 50.00 years of use. He has never used smokeless tobacco. He reports that he does not drink alcohol or use drugs. family history includes Cancer in his brother; Cancer (age of onset: 11) in his sister. No Known Allergies   Review of Systems  Constitutional: Negative for fatigue.  Eyes: Negative for visual disturbance.  Respiratory: Negative for cough, chest tightness and shortness of breath.   Cardiovascular: Negative for chest pain, palpitations and leg swelling.  Genitourinary: Negative for dysuria.  Neurological: Negative for dizziness, syncope, weakness, light-headedness and headaches.       Objective:   Physical Exam Constitutional:      Appearance: He is well-developed.  HENT:     Right Ear: External ear normal.     Left Ear: External ear normal.  Eyes:     Pupils: Pupils are equal, round, and reactive to light.  Neck:     Thyroid: No thyromegaly.  Cardiovascular:     Rate and Rhythm:  Normal rate and regular rhythm.  Pulmonary:     Effort: Pulmonary effort is normal. No respiratory distress.     Breath sounds: Normal breath sounds. No wheezing or rales.  Genitourinary:    Comments: He has right inguinal hernia which is soft and nontender.  Also has small umbilical hernia which is soft and nontender Musculoskeletal:     Cervical back: Neck supple.  Neurological:     Mental Status: He is alert and oriented to person, place, and time.        Assessment:     #1 right inguinal hernia-asymptomatic  #2 umbilical hernia-asymptomatic    Plan:     -Reassurance.  We offered surgical referral but at this point he does not wish to look at surgical options. -Be in touch if he is having pain or other concerns  [name] MD [facility] at [facility]

## 2020-01-10 ENCOUNTER — Other Ambulatory Visit: Payer: Self-pay

## 2020-01-13 ENCOUNTER — Other Ambulatory Visit: Payer: Self-pay

## 2020-01-13 ENCOUNTER — Ambulatory Visit (INDEPENDENT_AMBULATORY_CARE_PROVIDER_SITE_OTHER): Payer: Medicare Other | Admitting: General Practice

## 2020-01-13 DIAGNOSIS — Z7901 Long term (current) use of anticoagulants: Secondary | ICD-10-CM

## 2020-01-13 DIAGNOSIS — I4891 Unspecified atrial fibrillation: Secondary | ICD-10-CM

## 2020-01-13 LAB — POCT INR: INR: 2.3 (ref 2.0–3.0)

## 2020-01-13 NOTE — Patient Instructions (Addendum)
Pre visit review using our clinic review tool, if applicable. No additional management support is needed unless otherwise documented below in the visit note.  Continue to take 1 tablet daily except 2 tablets every Monday. Re-check in 4 weeks.

## 2020-01-14 DIAGNOSIS — H35052 Retinal neovascularization, unspecified, left eye: Secondary | ICD-10-CM | POA: Diagnosis not present

## 2020-01-14 DIAGNOSIS — H353212 Exudative age-related macular degeneration, right eye, with inactive choroidal neovascularization: Secondary | ICD-10-CM | POA: Diagnosis not present

## 2020-01-14 DIAGNOSIS — H353221 Exudative age-related macular degeneration, left eye, with active choroidal neovascularization: Secondary | ICD-10-CM | POA: Diagnosis not present

## 2020-01-14 DIAGNOSIS — H4052X2 Glaucoma secondary to other eye disorders, left eye, moderate stage: Secondary | ICD-10-CM | POA: Diagnosis not present

## 2020-01-22 ENCOUNTER — Telehealth: Payer: Self-pay | Admitting: Family Medicine

## 2020-01-22 DIAGNOSIS — C61 Malignant neoplasm of prostate: Secondary | ICD-10-CM

## 2020-01-22 NOTE — Telephone Encounter (Signed)
I would advise that he check with his urologist to get their input regarding timing/frequency

## 2020-01-22 NOTE — Telephone Encounter (Signed)
Lvm for pt to call back. 

## 2020-01-22 NOTE — Telephone Encounter (Addendum)
Pt states that he gets his shot on may 10th with urologist and needs psa done before going . Pt states that Dr.burchette has been doing this for 2 years

## 2020-01-22 NOTE — Telephone Encounter (Signed)
Pt would like a call back to go over when his next PSA labs are needed.

## 2020-01-22 NOTE — Telephone Encounter (Signed)
Please advise 

## 2020-01-22 NOTE — Telephone Encounter (Signed)
Pt has been scheduled for lab appt.  

## 2020-01-22 NOTE — Telephone Encounter (Signed)
OK to get PSA

## 2020-02-10 ENCOUNTER — Telehealth: Payer: Self-pay | Admitting: General Practice

## 2020-02-10 ENCOUNTER — Other Ambulatory Visit (INDEPENDENT_AMBULATORY_CARE_PROVIDER_SITE_OTHER): Payer: Medicare Other

## 2020-02-10 ENCOUNTER — Other Ambulatory Visit: Payer: Self-pay

## 2020-02-10 ENCOUNTER — Ambulatory Visit (INDEPENDENT_AMBULATORY_CARE_PROVIDER_SITE_OTHER): Payer: Medicare Other | Admitting: General Practice

## 2020-02-10 DIAGNOSIS — Z7901 Long term (current) use of anticoagulants: Secondary | ICD-10-CM | POA: Diagnosis not present

## 2020-02-10 DIAGNOSIS — C61 Malignant neoplasm of prostate: Secondary | ICD-10-CM | POA: Diagnosis not present

## 2020-02-10 LAB — POCT INR: INR: 2.5 (ref 2.0–3.0)

## 2020-02-10 LAB — PSA: PSA: 1.02 ng/mL (ref 0.10–4.00)

## 2020-02-10 NOTE — Patient Instructions (Signed)
Pre visit review using our clinic review tool, if applicable. No additional management support is needed unless otherwise documented below in the visit note.  Continue to take 1 tablet daily except 2 tablets every Monday. Re-check in 6 weeks. 

## 2020-02-10 NOTE — Telephone Encounter (Signed)
[place],  Patient is requesting his pending PSA test results be faxed to Dr. Heather Roberts @ [phone].  Thanks Villa Herb, RN

## 2020-02-10 NOTE — Telephone Encounter (Signed)
This has been faxed.

## 2020-02-10 NOTE — Telephone Encounter (Signed)
Noted waiting for results to come back

## 2020-02-14 DIAGNOSIS — C44319 Basal cell carcinoma of skin of other parts of face: Secondary | ICD-10-CM | POA: Diagnosis not present

## 2020-02-14 DIAGNOSIS — L570 Actinic keratosis: Secondary | ICD-10-CM | POA: Diagnosis not present

## 2020-02-14 DIAGNOSIS — L821 Other seborrheic keratosis: Secondary | ICD-10-CM | POA: Diagnosis not present

## 2020-02-14 DIAGNOSIS — Z85828 Personal history of other malignant neoplasm of skin: Secondary | ICD-10-CM | POA: Diagnosis not present

## 2020-02-24 DIAGNOSIS — C61 Malignant neoplasm of prostate: Secondary | ICD-10-CM | POA: Diagnosis not present

## 2020-03-09 DIAGNOSIS — C44329 Squamous cell carcinoma of skin of other parts of face: Secondary | ICD-10-CM | POA: Diagnosis not present

## 2020-03-09 DIAGNOSIS — Z85828 Personal history of other malignant neoplasm of skin: Secondary | ICD-10-CM | POA: Diagnosis not present

## 2020-03-19 ENCOUNTER — Other Ambulatory Visit (INDEPENDENT_AMBULATORY_CARE_PROVIDER_SITE_OTHER): Payer: Self-pay | Admitting: Ophthalmology

## 2020-03-19 ENCOUNTER — Other Ambulatory Visit: Payer: Self-pay | Admitting: Family Medicine

## 2020-03-20 ENCOUNTER — Other Ambulatory Visit: Payer: Self-pay

## 2020-03-23 ENCOUNTER — Other Ambulatory Visit: Payer: Self-pay

## 2020-03-23 ENCOUNTER — Ambulatory Visit (INDEPENDENT_AMBULATORY_CARE_PROVIDER_SITE_OTHER): Payer: Medicare Other | Admitting: General Practice

## 2020-03-23 ENCOUNTER — Other Ambulatory Visit: Payer: Self-pay | Admitting: General Practice

## 2020-03-23 DIAGNOSIS — Z7901 Long term (current) use of anticoagulants: Secondary | ICD-10-CM

## 2020-03-23 DIAGNOSIS — I4891 Unspecified atrial fibrillation: Secondary | ICD-10-CM

## 2020-03-23 LAB — POCT INR: INR: 2.2 (ref 2.0–3.0)

## 2020-03-23 NOTE — Patient Instructions (Signed)
Pre visit review using our clinic review tool, if applicable. No additional management support is needed unless otherwise documented below in the visit note.  Continue to take 1 tablet daily except 2 tablets every Monday. Re-check in 6 weeks. 

## 2020-03-24 ENCOUNTER — Ambulatory Visit (INDEPENDENT_AMBULATORY_CARE_PROVIDER_SITE_OTHER): Payer: Medicare Other | Admitting: Ophthalmology

## 2020-03-24 ENCOUNTER — Encounter (INDEPENDENT_AMBULATORY_CARE_PROVIDER_SITE_OTHER): Payer: Self-pay | Admitting: Ophthalmology

## 2020-03-24 DIAGNOSIS — H35052 Retinal neovascularization, unspecified, left eye: Secondary | ICD-10-CM

## 2020-03-24 DIAGNOSIS — H353212 Exudative age-related macular degeneration, right eye, with inactive choroidal neovascularization: Secondary | ICD-10-CM

## 2020-03-24 DIAGNOSIS — H4052X2 Glaucoma secondary to other eye disorders, left eye, moderate stage: Secondary | ICD-10-CM

## 2020-03-24 DIAGNOSIS — H353221 Exudative age-related macular degeneration, left eye, with active choroidal neovascularization: Secondary | ICD-10-CM | POA: Diagnosis not present

## 2020-03-24 MED ORDER — BEVACIZUMAB CHEMO INJECTION 1.25MG/0.05ML SYRINGE FOR KALEIDOSCOPE
1.2500 mg | INTRAVITREAL | Status: AC | PRN
  Administered 2020-03-24: 1.25 mg via INTRAVITREAL

## 2020-03-24 NOTE — Assessment & Plan Note (Signed)
The nature of wet macular degeneration was discussed with the patient.  Forms of therapy reviewed include the use of Anti-VEGF medications injected painlessly into the eye, as well as other possible treatment modalities, including thermal laser therapy. Fellow eye involvement and risks were discussed with the patient. Upon the finding of wet age related macular degeneration, treatment will be offered. The treatment regimen is on a treat as needed basis with the intent to treat if necessary and extend interval of exams when possible. On average 1 out of 6 patients do not need lifetime therapy. However, the risk of recurrent disease is high for a lifetime.  Initially monthly, then periodic, examinations and evaluations will determine whether the next treatment is required on the day of the examination.  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  Stable at 10 weeks.  History of multiple recurrences in the past.  We will repeat intravitreal Avastin OS today

## 2020-03-24 NOTE — Progress Notes (Signed)
03/24/2020     CHIEF COMPLAINT Patient presents for Retina Follow Up   HISTORY OF PRESENT ILLNESS: Mark Wu is a 86 y.o. male who presents to the clinic today for:   HPI    Retina Follow Up    Patient presents with  Wet AMD.  In left eye.  Severity is moderate.  Duration of 10 weeks.  Since onset it is stable.  I, the attending physician,  performed the HPI with the patient and updated documentation appropriately.          Comments    10 Week AMD f\u OS. Possible Avastin OS. OCT  Pt states vision is the same since his last visit. Denies any complaints. Using gtts as directed. NEEDS REFILL       Last edited by Tilda Franco on 03/24/2020  9:26 AM. (History)      Referring physician: [name], MD [place],  [place] [id number]  HISTORICAL INFORMATION:   Selected notes from the MEDICAL RECORD NUMBER       CURRENT MEDICATIONS: Current Outpatient Medications (Ophthalmic Drugs)  Medication Sig  . latanoprost (XALATAN) 0.005 % ophthalmic solution INSTILL 1 DROP IN THE LEFT EYE NIGHTLY. (DISCARD 42 DAYS AFTER OPENING)   No current facility-administered medications for this visit. (Ophthalmic Drugs)   Current Outpatient Medications (Other)  Medication Sig  . amLODipine (NORVASC) 5 MG tablet TAKE 1 TABLET DAILY (NEED PHYSICAL)  . Calcium Carb-Cholecalciferol (CALCIUM CARBONATE-VITAMIN D3 PO) Take by mouth.  . clotrimazole-betamethasone (LOTRISONE) cream Apply topically as needed.  . metoprolol succinate (TOPROL-XL) 25 MG 24 hr tablet Take 1 tablet (25 mg total) by mouth daily.  . tamsulosin (FLOMAX) 0.4 MG CAPS capsule Take 1 capsule (0.4 mg total) daily by mouth.  . warfarin (COUMADIN) 2.5 MG tablet Take 1 tablet daily except take 2 tablets on Tues or Take as directed by anticoagulation clinic   No current facility-administered medications for this visit. (Other)      REVIEW OF SYSTEMS:    ALLERGIES No Known Allergies  PAST  MEDICAL HISTORY Past Medical History:  Diagnosis Date  . Anal fissure 06/11/2009  . Atrial fibrillation ([place]) 06/11/2009  . HYPERTENSION 06/11/2009  . NECK MASS 10/01/2010  . Prostate cancer [facility])    Past Surgical History:  Procedure Laterality Date  . CARPAL TUNNEL RELEASE     Both hands  . CYST REMOVAL HAND     Left hand  . CYSTOSCOPY  2000   hand  . PROSTATE BIOPSY      FAMILY HISTORY Family History  Problem Relation Age of Onset  . Cancer Sister 51       unknown type  . Cancer Brother        prostate/seed implant with [name]/died 01-31-2014 of heart attack    SOCIAL HISTORY Social History   Tobacco Use  . Smoking status: Former Smoker    Years: 50.00    Types: Cigars    Quit date: 08/08/1991    Years since quitting: 28.6  . Smokeless tobacco: Never Used  Substance Use Topics  . Alcohol use: No  . Drug use: No         OPHTHALMIC EXAM: Base Eye Exam    Visual Acuity (Snellen - Linear)      Right Left   Dist [place] E Card @ 3' 20/50   Dist ph [place]  20/25 -1       Tonometry (Tonopen, 9:27 AM)  Right Left   Pressure 13 11       Pupils      Pupils Dark Light Shape React APD   Right PERRL 4 3 Round Brisk None   Left PERRL 4 3 Round Brisk None       Visual Fields (Counting fingers)      Left Right    Full Full       Neuro/Psych    Oriented x3: Yes   Mood/Affect: Normal       Dilation    Left eye: 1.0% Mydriacyl, 2.5% Phenylephrine @ 9:27 AM        Slit Lamp and Fundus Exam    External Exam      Right Left   External Normal Normal       Slit Lamp Exam      Right Left   Lids/Lashes Normal Normal   Conjunctiva/Sclera White and quiet White and quiet   Cornea Clear Clear   Anterior Chamber Deep and quiet Deep and quiet   Iris Round and reactive Round and reactive   Lens Centered posterior chamber intraocular lens Centered posterior chamber intraocular lens   Anterior Vitreous Normal Normal       Fundus Exam      Right Left   Posterior  Vitreous  Posterior vitreous detachment, Central vitreous floaters   Disc  Peripapillary atrophy   C/D Ratio  0.15   Macula  Subretinal hemorrhage superotemporal to the fovea,,, Disciform scar, Retinal pigment epithelial atrophy, Geographic atrophy   Vessels  Normal   Periphery  Normal          IMAGING AND PROCEDURES  Imaging and Procedures for 03/24/20           ASSESSMENT/PLAN:  No problem-specific Assessment & Plan notes found for this encounter.      ICD-10-CM   1. Exudative age-related macular degeneration of left eye with active choroidal neovascularization (HCC)  H35.3221 OCT, Retina - OU - Both Eyes  2. Secondary open-angle glaucoma, left, moderate stage  H40.52X2   3. Retinal neovascularization of left eye  H35.052   4. Exudative age-related macular degeneration of right eye with inactive choroidal neovascularization ([place])  H35.3212     1.OS, currently at 10-week exam interval.  Subfoveal disciform scar under control.  There is a region clinically of subretinal hemorrhage superotemporal to the fovea.  Will repeat intravitreal Avastin OS today  2.  3.  Ophthalmic Meds Ordered this visit:  No orders of the defined types were placed in this encounter.      No follow-ups on file.  There are no Patient Instructions on file for this visit.   Explained the diagnoses, plan, and follow up with the patient and they expressed understanding.  Patient expressed understanding of the importance of proper follow up care.   Clent Demark Tenicia Gural M.D. Diseases & Surgery of the Retina and Vitreous [facility]ina [facility] Di[facility] [place] 03/24/20     Abbreviations: M myopia (nearsighted); A astigmatism; H hyperopia (farsighted); P presbyopia; Mrx spectacle prescription;  CTL contact lenses; OD right eye; OS left eye; OU both eyes  XT exotropia; ET esotropia; PEK punctate epithelial keratitis; PEE punctate epithelial erosions; DES dry eye syndrome; MGD meibomian gland dysfunction; ATs  artificial tears; PFAT's preservative free artificial tears; [place] nuclear sclerotic cataract; PSC posterior subcapsular cataract; ERM epi-retinal membrane; PVD posterior vitreous detachment; RD retinal detachment; DM diabetes mellitus; DR diabetic retinopathy; NPDR non-proliferative diabetic retinopathy; PDR proliferative diabetic retinopathy; CSME clinically significant macular  edema; DME diabetic macular edema; dbh dot blot hemorrhages; CWS cotton wool spot; POAG primary open angle glaucoma; C/D cup-to-disc ratio; HVF humphrey visual field; GVF goldmann visual field; OCT optical coherence tomography; IOP intraocular pressure; BRVO Branch retinal vein occlusion; CRVO central retinal vein occlusion; CRAO central retinal artery occlusion; BRAO branch retinal artery occlusion; RT retinal tear; SB scleral buckle; PPV pars plana vitrectomy; VH Vitreous hemorrhage; PRP panretinal laser photocoagulation; IVK intravitreal kenalog; VMT vitreomacular traction; MH Macular hole;  NVD neovascularization of the disc; NVE neovascularization elsewhere; AREDS age related eye disease study; ARMD age related macular degeneration; POAG primary open angle glaucoma; EBMD epithelial/anterior basement membrane dystrophy; ACIOL anterior chamber intraocular lens; IOL intraocular lens; PCIOL posterior chamber intraocular lens; Phaco/IOL phacoemulsification with intraocular lens placement; [place] photorefractive keratectomy; LASIK laser assisted in situ keratomileusis; HTN hypertension; DM diabetes mellitus; COPD chronic obstructive pulmonary disease

## 2020-04-17 DIAGNOSIS — W57XXXA Bitten or stung by nonvenomous insect and other nonvenomous arthropods, initial encounter: Secondary | ICD-10-CM | POA: Diagnosis not present

## 2020-04-17 DIAGNOSIS — L02416 Cutaneous abscess of left lower limb: Secondary | ICD-10-CM | POA: Diagnosis not present

## 2020-05-04 ENCOUNTER — Ambulatory Visit (INDEPENDENT_AMBULATORY_CARE_PROVIDER_SITE_OTHER): Payer: Medicare Other | Admitting: Family Medicine

## 2020-05-04 ENCOUNTER — Other Ambulatory Visit: Payer: Self-pay

## 2020-05-04 ENCOUNTER — Ambulatory Visit (INDEPENDENT_AMBULATORY_CARE_PROVIDER_SITE_OTHER): Payer: Medicare Other | Admitting: General Practice

## 2020-05-04 ENCOUNTER — Encounter: Payer: Self-pay | Admitting: Family Medicine

## 2020-05-04 VITALS — BP 138/70 | HR 65 | Temp 97.7°F | Ht 68.0 in | Wt 177.6 lb

## 2020-05-04 DIAGNOSIS — I4891 Unspecified atrial fibrillation: Secondary | ICD-10-CM

## 2020-05-04 DIAGNOSIS — R1310 Dysphagia, unspecified: Secondary | ICD-10-CM | POA: Diagnosis not present

## 2020-05-04 DIAGNOSIS — R519 Headache, unspecified: Secondary | ICD-10-CM

## 2020-05-04 DIAGNOSIS — Z7901 Long term (current) use of anticoagulants: Secondary | ICD-10-CM

## 2020-05-04 LAB — POCT INR: INR: 2 (ref 2.0–3.0)

## 2020-05-04 NOTE — Patient Instructions (Signed)
Pre visit review using our clinic review tool, if applicable. No additional management support is needed unless otherwise documented below in the visit note. 

## 2020-05-04 NOTE — Patient Instructions (Signed)
Dysphagia  Dysphagia is trouble swallowing. This condition occurs when solids and liquids stick in a person's throat on the way down to the stomach, or when food takes longer to get to the stomach than usual. You may have problems swallowing food, liquids, or both. You may also have pain while trying to swallow. It may take you more time and effort to swallow something. What are the causes? This condition may be caused by:  Muscle problems. They may make it difficult for you to move food and liquids through the esophagus, which is the tube that connects your mouth to your stomach.  Blockages. You may have ulcers, scar tissue, or inflammation that blocks the normal passage of food and liquids. Causes of these problems include: ? Acid reflux from your stomach into your esophagus (gastroesophageal reflux). ? Infections. ? Radiation treatment for cancer. ? Medicines taken without enough fluids to wash them down into your stomach.  Stroke. This can affect the nerves and make it difficult to swallow.  Nerve problems. These prevent signals from being sent to the muscles of your esophagus to squeeze (contract) and move what you swallow down to your stomach.  Globus pharyngeus. This is a common problem that involves a feeling like something is stuck in your throat or a sense of trouble with swallowing, even though nothing is wrong with the swallowing passages.  Certain conditions, such as cerebral palsy or Parkinson's disease. What are the signs or symptoms? Common symptoms of this condition include:  A feeling that solids or liquids are stuck in your throat on the way down to the stomach.  Pain while swallowing.  Coughing or gagging while trying to swallow. Other symptoms include:  Food moving back from your stomach to your mouth (regurgitation).  Noises coming from your throat.  Chest discomfort with swallowing.  A feeling of fullness when swallowing.  Drooling, especially when the  throat is blocked.  Heartburn. How is this diagnosed? This condition may be diagnosed by:  Barium X-ray. In this test, you will swallow a white liquid that sticks to the inside of your esophagus. X-ray images are then taken.  Endoscopy. In this test, a flexible telescope is inserted down your throat to look at your esophagus and your stomach.  CT scans and an MRI. How is this treated? Treatment for dysphagia depends on the cause of this condition, such as:  If the dysphagia is caused by acid reflux or infection, medicines may be used. They may include antibiotics and heartburn medicines.  If the dysphagia is caused by problems with the muscles, swallowing therapy may be used to help you strengthen your swallowing muscles. You may have to do specific exercises to strengthen the muscles or stretch them.  If the dysphagia is caused by a blockage or mass, procedures to remove the blockage may be done. You may need surgery and a feeding tube. You may need to make diet changes. Ask your health care provider for specific instructions. Follow these instructions at home: Medicines  Take over-the-counter and prescription medicines only as told by your health care provider.  If you were prescribed an antibiotic medicine, take it as told by your health care provider. Do not stop taking the antibiotic even if you start to feel better. Eating and drinking   Follow any diet changes as told by your health care provider.  Work with a diet and nutrition specialist (dietitian) to create an eating plan that will help you get the nutrients you need in   order to stay healthy.  Eat soft foods that are easier to swallow.  Cut your food into small pieces and eat slowly. Take small bites.  Eat and drink only when you are sitting upright.  Do not drink alcohol or caffeine. If you need help quitting, ask your health care provider. General instructions  Check your weight every day to make sure you are  not losing weight.  Do not use any products that contain nicotine or tobacco, such as cigarettes, e-cigarettes, and chewing tobacco. If you need help quitting, ask your health care provider.  Keep all follow-up visits as told by your health care provider. This is important. Contact a health care provider if you:  Lose weight because you cannot swallow.  Cough when you drink liquids.  Cough up partially digested food. Get help right away if you:  Cannot swallow your saliva.  Have shortness of breath, a fever, or both.  Have a hoarse voice and also have trouble swallowing. Summary  Dysphagia is trouble swallowing. This condition occurs when solids and liquids stick in a person's throat on the way down to the stomach. You may cough or gag while trying to swallow.  Dysphagia has many possible causes.  Treatment for dysphagia depends on the cause of the condition.  Keep all follow-up visits as told by your health care provider. This is important. This information is not intended to replace advice given to you by your health care provider. Make sure you discuss any questions you have with your health care provider. Document Revised: 02/27/2019 Document Reviewed: 02/27/2019 Elsevier Patient Education  2020 [place] well and eat slowly  Consider over the counter Pepcid 20 mg twice daily  We will set up barium x-ray of esophagus  Consider over the counter Claritan or Flonase for allergy symptoms.

## 2020-05-04 NOTE — Progress Notes (Signed)
Established Patient Office Visit  Subjective:  Patient ID: Mark Wu, male    DOB: May 19, 1926  Age: 86 y.o. MRN: [id number]  CC:  Chief Complaint  Patient presents with   patient stated he feels like his food is getting stuck in   in the throat x4-5 months ago   Headache    patient complains of a senation of a "spike in his head" x2 months, also stated head feels "full" at times    HPI Mark Wu presents for several month history of intermittent dysphagia to solid foods.  He had similar current back in 2016-01-12 and had diagnostic esophagram at that time that showed small hiatal hernia and mild distal esophageal narrowing.  There was no intervention at that time and symptoms stabilized.  He has been trying to chew his food very well and eating very slowly.  He states his appetite is normal.  He has lost a few pounds from 2023-01-12 but he attributes this to hot weather and has scaled back number of meals per day.  He does have frequent belching.  No active heartburn symptoms.  No history of EGD.  No abdominal pain.  Second issue is he relates that he has had occasional fleeting "spike "headaches lasting only a few seconds.  These are not triggered by any particular thing.  These are infrequent and usually last only a second or 2.  No visual changes.  No nausea or vomiting.  No focal weakness.  No confusion.  No recent falls.    Past Medical History:  Diagnosis Date   Anal fissure 06/11/2009   Atrial fibrillation ([place]) 06/11/2009   HYPERTENSION 06/11/2009   NECK MASS 10/01/2010   Prostate cancer ([place])     Past Surgical History:  Procedure Laterality Date   CARPAL TUNNEL RELEASE     Both hands   CYST REMOVAL HAND     Left hand   CYSTOSCOPY  2000   hand   PROSTATE BIOPSY      Family History  Problem Relation Age of Onset   Cancer Sister 3       unknown type   Cancer Brother        prostate/seed implant with [name]/died 2014/01/11 of heart attack    Social History    Socioeconomic History   Marital status: Married    Spouse name: Not on file   Number of children: Not on file   Years of education: Not on file   Highest education level: Not on file  Occupational History   Occupation: retired    Comment: retired from [employer] in [place] Use   Smoking status: Former Smoker    Years: 50.00    Types: Cigars    Quit date: 08/08/1991    Years since quitting: 28.7   Smokeless tobacco: Never Used  Vaping Use   Vaping Use: Never used  Substance and Sexual Activity   Alcohol use: No   Drug use: No   Sexual activity: Never  Other Topics Concern   Not on file  Social History Narrative   Resides in [place] (near [place], [place])   Social Determinants of Health   Financial Resource Strain:    Difficulty of Paying Living Expenses:   Food Insecurity:    Worried About Charity fundraiser in the Last Year:    Arboriculturist in the Last Year:   Transportation Needs:    Film/video editor (Medical):    Lack of Transportation (Non-Medical):  Physical Activity:    Days of Exercise per Week:    Minutes of Exercise per Session:   Stress:    Feeling of Stress :   Social Connections:    Frequency of Communication with Friends and Family:    Frequency of Social Gatherings with Friends and Family:    Attends Religious Services:    Active Member of Clubs or Organizations:    Attends Music therapist:    Marital Status:   Intimate Partner Violence:    Fear of Current or Ex-Partner:    Emotionally Abused:    Physically Abused:    Sexually Abused:     Outpatient Medications Prior to Visit  Medication Sig Dispense Refill   amLODipine (NORVASC) 5 MG tablet TAKE 1 TABLET DAILY (NEED PHYSICAL) 90 tablet 3   Calcium Carb-Cholecalciferol (CALCIUM CARBONATE-VITAMIN D3 PO) Take by mouth.     clotrimazole-betamethasone (LOTRISONE) cream Apply topically as needed. 45 g 1   latanoprost (XALATAN) 0.005 %  ophthalmic solution INSTILL 1 DROP IN THE LEFT EYE NIGHTLY. (DISCARD 42 DAYS AFTER OPENING) 5 mL 3   metoprolol succinate (TOPROL-XL) 25 MG 24 hr tablet Take 1 tablet (25 mg total) by mouth daily. 90 tablet 3   tamsulosin (FLOMAX) 0.4 MG CAPS capsule Take 1 capsule (0.4 mg total) daily by mouth. 90 capsule 1   warfarin (COUMADIN) 2.5 MG tablet Take 1 tablet daily except take 2 tablets on Tues or Take as directed by anticoagulation clinic 120 tablet 1   No facility-administered medications prior to visit.    No Known Allergies  ROS Review of Systems  Constitutional: Negative for appetite change, chills, fatigue, fever and unexpected weight change.  HENT: Positive for congestion and trouble swallowing. Negative for sore throat.   Eyes: Negative for visual disturbance.  Respiratory: Negative for cough, chest tightness and shortness of breath.   Cardiovascular: Negative for chest pain, palpitations and leg swelling.  Neurological: Positive for headaches. Negative for dizziness, syncope, weakness and light-headedness.      Objective:    Physical Exam Constitutional:      Appearance: He is well-developed.  HENT:     Right Ear: External ear normal.     Left Ear: External ear normal.  Eyes:     Pupils: Pupils are equal, round, and reactive to light.  Neck:     Thyroid: No thyromegaly.  Cardiovascular:     Rate and Rhythm: Normal rate and regular rhythm.  Pulmonary:     Effort: Pulmonary effort is normal. No respiratory distress.     Breath sounds: Normal breath sounds. No wheezing or rales.  Abdominal:     Palpations: Abdomen is soft.     Tenderness: There is no abdominal tenderness.     Comments: He has small umbilical hernia which is soft and nontender  Musculoskeletal:     Cervical back: Neck supple.  Neurological:     Mental Status: He is alert and oriented to person, place, and time.     BP 138/70 (BP Location: Left Arm, Patient Position: Sitting, Cuff Size: Normal)     Pulse 65    Temp 97.7 F (36.5 C) (Oral)    Ht 5\' 8"  (1.727 m)    Wt 177 lb 9.6 oz (80.6 kg)    SpO2 97%    BMI 27.00 kg/m  Wt Readings from Last 3 Encounters:  05/04/20 177 lb 9.6 oz (80.6 kg)  12/25/19 181 lb 4.8 oz (82.2 kg)  10/07/19 183 lb 9.6  oz (83.3 kg)     Health Maintenance Due  Topic Date Due   TETANUS/TDAP  Never done    There are no preventive care reminders to display for this patient.  Lab Results  Component Value Date   TSH 1.06 04/04/2017   Lab Results  Component Value Date   WBC 6.9 04/04/2017   HGB 11.8 (L) 04/04/2017   HCT 36.9 (L) 04/04/2017   MCV 75.3 (L) 04/04/2017   PLT 409.0 (H) 04/04/2017   Lab Results  Component Value Date   NA 138 04/04/2017   K 4.1 04/04/2017   CO2 27 04/04/2017   GLUCOSE 87 04/04/2017   BUN 11 04/04/2017   CREATININE 0.96 04/04/2017   BILITOT 0.9 04/04/2017   ALKPHOS 45 04/04/2017   AST 17 04/04/2017   ALT 12 04/04/2017   PROT 6.6 04/04/2017   ALBUMIN 4.1 04/04/2017   CALCIUM 9.7 04/04/2017   GFR 78.45 04/04/2017   Lab Results  Component Value Date   CHOL 122 08/29/2012   Lab Results  Component Value Date   HDL 32.50 (L) 08/29/2012   Lab Results  Component Value Date   LDLCALC 76 08/29/2012   Lab Results  Component Value Date   TRIG 69.0 08/29/2012   Lab Results  Component Value Date   CHOLHDL 4 08/29/2012   No results found for: HGBA1C    Assessment & Plan:   #1  Intermittent dysphagia to solid food.  He had similar symptoms in the past that eventually improved with time.  denies any red flags such as appetite change or significant weight loss or any pain with swallowing  -Set up diagnostic esophagram to further assess -Eat slowly and chew food carefully.  We also recommend he try to avoid certain foods such as steak and may be difficult to chew -Consider trial over-the-counter Pepcid 20 mg twice daily -May need referral to GI for further evaluation and possible EGD depending on the  above  #2 very transient fleeting atypical headaches.  These are only lasting a second or 2 and sound more neuropathic. -Observation for now and follow-up promptly for any visual changes, confusion, focal weakness, or any other specific neurologic concerns  #3 history of chronic atrial fibrillation. -Follow-up with Coumadin clinic today as scheduled  No orders of the defined types were placed in this encounter.   Follow-up: No follow-ups on file.    [name], MD

## 2020-06-01 ENCOUNTER — Other Ambulatory Visit: Payer: Self-pay | Admitting: Family Medicine

## 2020-06-01 DIAGNOSIS — Z7901 Long term (current) use of anticoagulants: Secondary | ICD-10-CM

## 2020-06-02 ENCOUNTER — Ambulatory Visit (INDEPENDENT_AMBULATORY_CARE_PROVIDER_SITE_OTHER): Payer: Medicare Other | Admitting: Ophthalmology

## 2020-06-02 ENCOUNTER — Other Ambulatory Visit: Payer: Self-pay

## 2020-06-02 ENCOUNTER — Encounter (INDEPENDENT_AMBULATORY_CARE_PROVIDER_SITE_OTHER): Payer: Self-pay | Admitting: Ophthalmology

## 2020-06-02 DIAGNOSIS — H353221 Exudative age-related macular degeneration, left eye, with active choroidal neovascularization: Secondary | ICD-10-CM

## 2020-06-02 DIAGNOSIS — H353212 Exudative age-related macular degeneration, right eye, with inactive choroidal neovascularization: Secondary | ICD-10-CM

## 2020-06-02 MED ORDER — BEVACIZUMAB CHEMO INJECTION 1.25MG/0.05ML SYRINGE FOR KALEIDOSCOPE
1.2500 mg | INTRAVITREAL | Status: AC | PRN
  Administered 2020-06-02: 1.25 mg via INTRAVITREAL

## 2020-06-02 NOTE — Patient Instructions (Addendum)
Patient to notify the office promptly if new visual difficulties or changes Age-Related Macular Degeneration  Age-related macular degeneration (AMD) is an eye disease related to aging. The disease causes a loss of central vision. Central vision allows a person to see objects clearly and do daily tasks like reading and driving. There are two main types of AMD:  Dry AMD. People with this type generally lose their vision slowly. This is the most common type of AMD. Some people with dry AMD notice very little change in their vision as they age.  Wet AMD. People with this type can lose their vision quickly. What are the causes? This condition is caused by damage to the part of the eye that provides you with central vision (macula).  Dry AMD happens when deposits in the macula cause light-sensitive cells to slowly break down.  Wet AMD happens when abnormal blood vessels grow under the macula and leak blood and fluid. What increases the risk? You are more likely to develop this condition if you:  Are 36 years old or older, and especially 58 years old or older.  Smoke.  Are obese.  Have a family history of AMD.  Have high cholesterol, high blood pressure, or heart disease.  Have been exposed to high levels of ultraviolet (UV) light and blue light.  Are white (Caucasian).  Are male. What are the signs or symptoms? Common symptoms of this condition include:  Blurred vision, especially when reading print material. The blurred vision often improves in brighter light.  A blurred or blind spot in the center of your field of vision that is small but growing larger.  Bright colors seeming less bright than they used to be.  Decreased ability to recognize and see faces.  One eye seeing worse than the other.  Decreased ability to adapt to dimly lit rooms.  Straight lines appearing crooked or wavy. How is this diagnosed? This condition is diagnosed based on your symptoms and an eye  exam. During the eye exam:  Eye drops will be placed into your eyes to enlarge (dilate) your pupils. This will allow your health care provider to see the back of your eye.  You may be asked to look at an image that looks like a checkerboard (Amsler grid). Early changes in your central vision may cause the grid to appear distorted. After the exam, you may be given one or both of these tests:  Fluorescein angiogram. This test determines whether you have dry or wet AMD.  Optical coherence tomography (OCT) test to evaluate deep layers of the retina. How is this treated? There is no cure for this condition, but treatment can help to slow down progression of the disease. This condition may be treated with:  Supplements, including vitamin C, vitamin E, beta carotene, and zinc.  Laser surgery to destroy new blood vessels or leaking blood vessels in your eye.  Injections of medicines into your eye to slow down the formation of abnormal blood vessels that may leak. These injections may need to be repeated on a routine basis. Follow these instructions at home:  Take over-the-counter and prescription medicines only as told by your health care provider.  Take vitamins and supplements as told by your health care provider.  Ask your health care provider for an Amsler grid. Use it every day to check each eye for vision changes.  Get an eye exam as often as told by your health care provider. Make sure to get an eye exam at  least once every year.  Keep all follow-up visits as told by your health care provider. This is important. Contact a health care provider if:  You notice any new changes in your vision. Get help right away if:  You suddenly lose vision or develop pain in the eye. Summary  Age-related macular degeneration (AMD) is an eye disease related to aging. There are two types of this condition: dry AMD and wet AMD.  This condition is caused by damage to the part of the eye that provides  you with central vision (macula).  Once diagnosed with AMD, make sure to get an eye exam every year, take supplements and vitamins as directed, use an Amsler grid at home, and follow up with your health care provider. This information is not intended to replace advice given to you by your health care provider. Make sure you discuss any questions you have with your health care provider. Document Revised: 04/11/2018 Document Reviewed: 04/11/2018 Elsevier Patient Education  [place].

## 2020-06-02 NOTE — Assessment & Plan Note (Signed)
No active areas of the lesion

## 2020-06-02 NOTE — Progress Notes (Signed)
06/02/2020     CHIEF COMPLAINT Patient presents for Retina Follow Up   HISTORY OF PRESENT ILLNESS: Mark Wu is a 86 y.o. male who presents to the clinic today for:   HPI    Retina Follow Up    Patient presents with  Wet AMD.  In left eye.  This started 10 weeks ago.  Severity is moderate.  Duration of 10 weeks.  Since onset it is stable.          Comments    10 Week AMD F/U OU, poss Avastin OS  Pt denies noticeable changes to [place] OU since last visit. Pt denies ocular pain, flashes of light, or floaters OU.         Last edited by Rockie Neighbours, [place] on 06/02/2020  9:17 AM. (History)      Referring physician: [name], MD [place],  [place] [id number]  HISTORICAL INFORMATION:   Selected notes from the MEDICAL RECORD NUMBER       CURRENT MEDICATIONS: Current Outpatient Medications (Ophthalmic Drugs)  Medication Sig  . latanoprost (XALATAN) 0.005 % ophthalmic solution INSTILL 1 DROP IN THE LEFT EYE NIGHTLY. (DISCARD 42 DAYS AFTER OPENING)   No current facility-administered medications for this visit. (Ophthalmic Drugs)   Current Outpatient Medications (Other)  Medication Sig  . amLODipine (NORVASC) 5 MG tablet TAKE 1 TABLET DAILY (NEED PHYSICAL)  . Calcium Carb-Cholecalciferol (CALCIUM CARBONATE-VITAMIN D3 PO) Take by mouth.  . clotrimazole-betamethasone (LOTRISONE) cream Apply topically as needed.  . metoprolol succinate (TOPROL-XL) 25 MG 24 hr tablet Take 1 tablet (25 mg total) by mouth daily.  . tamsulosin (FLOMAX) 0.4 MG CAPS capsule Take 1 capsule (0.4 mg total) daily by mouth.  . warfarin (COUMADIN) 2.5 MG tablet Take 1 tablet daily except take 2 tablets on Tues or Take as directed by anticoagulation clinic   No current facility-administered medications for this visit. (Other)      REVIEW OF SYSTEMS:    ALLERGIES No Known Allergies  PAST MEDICAL HISTORY Past Medical History:  Diagnosis Date  . Anal fissure 06/11/2009    . Atrial fibrillation ([place]) 06/11/2009  . HYPERTENSION 06/11/2009  . NECK MASS 10/01/2010  . Prostate cancer [facility])    Past Surgical History:  Procedure Laterality Date  . CARPAL TUNNEL RELEASE     Both hands  . CYST REMOVAL HAND     Left hand  . CYSTOSCOPY  2000   hand  . PROSTATE BIOPSY      FAMILY HISTORY Family History  Problem Relation Age of Onset  . Cancer Sister 19       unknown type  . Cancer Brother        prostate/seed implant with [name]/died Jan 18, 2014 of heart attack    SOCIAL HISTORY Social History   Tobacco Use  . Smoking status: Former Smoker    Years: 50.00    Types: Cigars    Quit date: 08/08/1991    Years since quitting: 28.8  . Smokeless tobacco: Never Used  Vaping Use  . Vaping Use: Never used  Substance Use Topics  . Alcohol use: No  . Drug use: No         OPHTHALMIC EXAM:  Base Eye Exam    Visual Acuity (ETDRS)      Right Left   Dist cc E card @ 3' 20/30 -2   Dist ph cc NI 20/30 +2   Correction: Glasses       Tonometry (Tonopen,  9:21 AM)      Right Left   Pressure 16 16       Pupils      Pupils Dark Light Shape React APD   Right PERRL 4 3 Round Brisk None   Left PERRL 4 3 Round Brisk None       Visual Fields (Counting fingers)      Left Right    Full Full       Extraocular Movement      Right Left    Full Full       Neuro/Psych    Oriented x3: Yes   Mood/Affect: Normal       Dilation    Both eyes: 1.0% Mydriacyl, 2.5% Phenylephrine @ 9:21 AM        Slit Lamp and Fundus Exam    External Exam      Right Left   External Normal Normal       Slit Lamp Exam      Right Left   Lids/Lashes Normal Normal   Conjunctiva/Sclera White and quiet White and quiet   Cornea Clear Clear   Anterior Chamber Deep and quiet Deep and quiet   Iris Round and reactive Round and reactive   Lens Centered posterior chamber intraocular lens Centered posterior chamber intraocular lens   Anterior Vitreous Normal Normal       Fundus  Exam      Right Left   Posterior Vitreous Posterior vitreous detachment Posterior vitreous detachment, Central vitreous floaters   Disc Peripapillary atrophy Peripapillary atrophy   C/D Ratio 0.25 0.15   Macula Disciform scar, no exudates, no macular thickening, no membrane, Retinal pigment epithelial mottling, Geographic atrophy Subretinal hemorrhage superotemporal to the fovea,,, Disciform scar, Retinal pigment epithelial atrophy, Geographic atrophy   Vessels Normal Normal   Periphery Normal Normal          IMAGING AND PROCEDURES  Imaging and Procedures for 06/02/20  OCT, Retina - OU - Both Eyes       Right Eye Quality was good. Scan locations included subfoveal. Central Foveal Thickness: 353. Progression has been stable. Findings include abnormal foveal contour, disciform scar.   Left Eye Quality was good. Scan locations included subfoveal. Central Foveal Thickness: 352. Progression has worsened. Findings include abnormal foveal contour, disciform scar, intraretinal fluid, subretinal fluid.   Notes OD, chronic subfoveal scarring from prior disciform scar, no active edges  OS, with new area of intrusion of the neovascular disease into the retina nasal to the fovea, coincides with subretinal hemorrhage .  We will repeat injection intravitreal Avastin OS today and examination in 6 weeks OS       Intravitreal Injection, Pharmacologic Agent - OS - Left Eye       Time Out 06/02/2020. 10:07 AM. Confirmed correct patient, procedure, site, and patient consented.   Anesthesia Topical anesthesia was used. Anesthetic medications included [place] 3.5%.   Procedure Preparation included Ofloxacin , Tobramycin 0.3%, 10% betadine to eyelids, 5% betadine to ocular surface. A supplied needle was used.   Injection:  1.25 mg Bevacizumab (AVASTIN) SOLN   NDC: [address]-2, Lot: [id number]   Route: Intravitreal, Site: Left Eye, Waste: 0 mg  Post-op Post injection exam found visual acuity of  at least counting fingers. The patient tolerated the procedure well. The patient received written and verbal post procedure care education. Post injection medications were not given.                 ASSESSMENT/PLAN:  Exudative  age-related macular degeneration of left eye with active choroidal neovascularization (HCC) OS, with new area of intrusion of the neovascular disease into the retina nasal to the fovea, coincides with subretinal hemorrhage .  We will repeat injection intravitreal Avastin OS today and examination in 6 weeks OS  Exudative age-related macular degeneration of right eye with inactive choroidal neovascularization (HCC) No active areas of the lesion      ICD-10-CM   1. Exudative age-related macular degeneration of left eye with active choroidal neovascularization (HCC)  H35.3221 OCT, Retina - OU - Both Eyes    Intravitreal Injection, Pharmacologic Agent - OS - Left Eye    Bevacizumab (AVASTIN) SOLN 1.25 mg  2. Exudative age-related macular degeneration of right eye with inactive choroidal neovascularization ([place])  H35.3212     1.  Based upon clinical examination OS today and OCT findings, the disease activity has reoccurred at this 9-week interval.  We will repeat injection Avastin OS today and examination in 6 weeks to keep this monocular patient functioning well  2.  3.  Ophthalmic Meds Ordered this visit:  Meds ordered this encounter  Medications  . Bevacizumab (AVASTIN) SOLN 1.25 mg       Return in about 6 weeks (around 07/14/2020) for AVASTIN OCT, OS, dilate.  Patient Instructions  Patient to notify the office promptly if new visual difficulties or changes Age-Related Macular Degeneration  Age-related macular degeneration (AMD) is an eye disease related to aging. The disease causes a loss of central vision. Central vision allows a person to see objects clearly and do daily tasks like reading and driving. There are two main types of AMD:  Dry AMD.  People with this type generally lose their vision slowly. This is the most common type of AMD. Some people with dry AMD notice very little change in their vision as they age.  Wet AMD. People with this type can lose their vision quickly. What are the causes? This condition is caused by damage to the part of the eye that provides you with central vision (macula).  Dry AMD happens when deposits in the macula cause light-sensitive cells to slowly break down.  Wet AMD happens when abnormal blood vessels grow under the macula and leak blood and fluid. What increases the risk? You are more likely to develop this condition if you:  Are 40 years old or older, and especially 52 years old or older.  Smoke.  Are obese.  Have a family history of AMD.  Have high cholesterol, high blood pressure, or heart disease.  Have been exposed to high levels of ultraviolet (UV) light and blue light.  Are white (Caucasian).  Are male. What are the signs or symptoms? Common symptoms of this condition include:  Blurred vision, especially when reading print material. The blurred vision often improves in brighter light.  A blurred or blind spot in the center of your field of vision that is small but growing larger.  Bright colors seeming less bright than they used to be.  Decreased ability to recognize and see faces.  One eye seeing worse than the other.  Decreased ability to adapt to dimly lit rooms.  Straight lines appearing crooked or wavy. How is this diagnosed? This condition is diagnosed based on your symptoms and an eye exam. During the eye exam:  Eye drops will be placed into your eyes to enlarge (dilate) your pupils. This will allow your health care provider to see the back of your eye.  You may be  asked to look at an image that looks like a checkerboard (Amsler grid). Early changes in your central vision may cause the grid to appear distorted. After the exam, you may be given one or  both of these tests:  Fluorescein angiogram. This test determines whether you have dry or wet AMD.  Optical coherence tomography (OCT) test to evaluate deep layers of the retina. How is this treated? There is no cure for this condition, but treatment can help to slow down progression of the disease. This condition may be treated with:  Supplements, including vitamin C, vitamin E, beta carotene, and zinc.  Laser surgery to destroy new blood vessels or leaking blood vessels in your eye.  Injections of medicines into your eye to slow down the formation of abnormal blood vessels that may leak. These injections may need to be repeated on a routine basis. Follow these instructions at home:  Take over-the-counter and prescription medicines only as told by your health care provider.  Take vitamins and supplements as told by your health care provider.  Ask your health care provider for an Amsler grid. Use it every day to check each eye for vision changes.  Get an eye exam as often as told by your health care provider. Make sure to get an eye exam at least once every year.  Keep all follow-up visits as told by your health care provider. This is important. Contact a health care provider if:  You notice any new changes in your vision. Get help right away if:  You suddenly lose vision or develop pain in the eye. Summary  Age-related macular degeneration (AMD) is an eye disease related to aging. There are two types of this condition: dry AMD and wet AMD.  This condition is caused by damage to the part of the eye that provides you with central vision (macula).  Once diagnosed with AMD, make sure to get an eye exam every year, take supplements and vitamins as directed, use an Amsler grid at home, and follow up with your health care provider. This information is not intended to replace advice given to you by your health care provider. Make sure you discuss any questions you have with your health  care provider. Document Revised: 04/11/2018 Document Reviewed: 04/11/2018 Elsevier Patient Education  [place] the diagnoses, plan, and follow up with the patient and they expressed understanding.  Patient expressed understanding of the importance of proper follow up care.   Clent Demark Sebastian Dzik M.D. Diseases & Surgery of the Retina and Vitreous [facility] [place] 06/02/20     Abbreviations: M myopia (nearsighted); A astigmatism; H hyperopia (farsighted); P presbyopia; Mrx spectacle prescription;  CTL contact lenses; OD right eye; OS left eye; OU both eyes  XT exotropia; ET esotropia; PEK punctate epithelial keratitis; PEE punctate epithelial erosions; DES dry eye syndrome; MGD meibomian gland dysfunction; ATs artificial tears; PFAT's preservative free artificial tears; [place] nuclear sclerotic cataract; PSC posterior subcapsular cataract; ERM epi-retinal membrane; PVD posterior vitreous detachment; RD retinal detachment; DM diabetes mellitus; DR diabetic retinopathy; NPDR non-proliferative diabetic retinopathy; PDR proliferative diabetic retinopathy; CSME clinically significant macular edema; DME diabetic macular edema; dbh dot blot hemorrhages; CWS cotton wool spot; POAG primary open angle glaucoma; C/D cup-to-disc ratio; HVF humphrey visual field; GVF goldmann visual field; OCT optical coherence tomography; IOP intraocular pressure; BRVO Branch retinal vein occlusion; CRVO central retinal vein occlusion; CRAO central retinal artery occlusion; BRAO branch retinal artery occlusion; RT retinal  tear; SB scleral buckle; PPV pars plana vitrectomy; VH Vitreous hemorrhage; PRP panretinal laser photocoagulation; IVK intravitreal kenalog; VMT vitreomacular traction; MH Macular hole;  NVD neovascularization of the disc; NVE neovascularization elsewhere; AREDS age related eye disease study; ARMD age related macular degeneration; POAG primary open angle glaucoma; EBMD  epithelial/anterior basement membrane dystrophy; ACIOL anterior chamber intraocular lens; IOL intraocular lens; PCIOL posterior chamber intraocular lens; Phaco/IOL phacoemulsification with intraocular lens placement; [place] photorefractive keratectomy; LASIK laser assisted in situ keratomileusis; HTN hypertension; DM diabetes mellitus; COPD chronic obstructive pulmonary disease

## 2020-06-02 NOTE — Assessment & Plan Note (Signed)
OS, with new area of intrusion of the neovascular disease into the retina nasal to the fovea, coincides with subretinal hemorrhage .  We will repeat injection intravitreal Avastin OS today and examination in 6 weeks OS

## 2020-06-15 ENCOUNTER — Ambulatory Visit (INDEPENDENT_AMBULATORY_CARE_PROVIDER_SITE_OTHER): Payer: Medicare Other | Admitting: General Practice

## 2020-06-15 ENCOUNTER — Other Ambulatory Visit: Payer: Self-pay

## 2020-06-15 DIAGNOSIS — I4891 Unspecified atrial fibrillation: Secondary | ICD-10-CM | POA: Diagnosis not present

## 2020-06-15 DIAGNOSIS — Z7901 Long term (current) use of anticoagulants: Secondary | ICD-10-CM

## 2020-06-15 LAB — POCT INR: INR: 1.7 — AB (ref 2.0–3.0)

## 2020-06-15 NOTE — Patient Instructions (Addendum)
Pre visit review using our clinic review tool, if applicable. No additional management support is needed unless otherwise documented below in the visit note.  Take 3 tablets today (8/30) and then continue to take 1 tablet daily except 2 tablets every Monday. Re-check in 5 to 6 weeks.

## 2020-06-25 ENCOUNTER — Other Ambulatory Visit: Payer: Self-pay | Admitting: Family Medicine

## 2020-07-14 ENCOUNTER — Encounter (INDEPENDENT_AMBULATORY_CARE_PROVIDER_SITE_OTHER): Payer: Self-pay | Admitting: Ophthalmology

## 2020-07-14 ENCOUNTER — Other Ambulatory Visit: Payer: Self-pay

## 2020-07-14 ENCOUNTER — Ambulatory Visit (INDEPENDENT_AMBULATORY_CARE_PROVIDER_SITE_OTHER): Payer: Medicare Other | Admitting: Ophthalmology

## 2020-07-14 DIAGNOSIS — H353221 Exudative age-related macular degeneration, left eye, with active choroidal neovascularization: Secondary | ICD-10-CM | POA: Diagnosis not present

## 2020-07-14 DIAGNOSIS — H353212 Exudative age-related macular degeneration, right eye, with inactive choroidal neovascularization: Secondary | ICD-10-CM | POA: Diagnosis not present

## 2020-07-14 MED ORDER — BEVACIZUMAB CHEMO INJECTION 1.25MG/0.05ML SYRINGE FOR KALEIDOSCOPE
1.2500 mg | INTRAVITREAL | Status: AC | PRN
  Administered 2020-07-14: 1.25 mg via INTRAVITREAL

## 2020-07-14 NOTE — Assessment & Plan Note (Signed)
Stable OD and no change

## 2020-07-14 NOTE — Assessment & Plan Note (Signed)
OS, chronic active CNVM with encroachment of scarring into the retina yet stable over the last Decade.  Less active compared to last visit

## 2020-07-14 NOTE — Progress Notes (Signed)
07/14/2020     CHIEF COMPLAINT Patient presents for Retina Follow Up   HISTORY OF PRESENT ILLNESS: Mark Wu is a 86 y.o. male who presents to the clinic today for:   HPI    Retina Follow Up    Patient presents with  Wet AMD.  In left eye.  This started 6 weeks ago.  Severity is mild.  Duration of 6 weeks.  Since onset it is stable.          Comments    6 Week AMD F/U OS, poss Avastin OS  Pt denies noticeable changes to [place] OU since last visit. Pt denies ocular pain, flashes of light, or floaters OU.         Last edited by Rockie Neighbours, [place] on 07/14/2020  9:33 AM. (History)      Referring physician: [name], MD [address],  [place] [address]  HISTORICAL INFORMATION:   Selected notes from the MEDICAL RECORD NUMBER       CURRENT MEDICATIONS: Current Outpatient Medications (Ophthalmic Drugs)  Medication Sig  . latanoprost (XALATAN) 0.005 % ophthalmic solution INSTILL 1 DROP IN THE LEFT EYE NIGHTLY. (DISCARD 42 DAYS AFTER OPENING)   No current facility-administered medications for this visit. (Ophthalmic Drugs)   Current Outpatient Medications (Other)  Medication Sig  . amLODipine (NORVASC) 5 MG tablet TAKE 1 TABLET DAILY (NEED PHYSICAL)  . Calcium Carb-Cholecalciferol (CALCIUM CARBONATE-VITAMIN D3 PO) Take by mouth.  . clotrimazole-betamethasone (LOTRISONE) cream Apply topically as needed.  . metoprolol succinate (TOPROL-XL) 25 MG 24 hr tablet TAKE 1 TABLET DAILY  . tamsulosin (FLOMAX) 0.4 MG CAPS capsule Take 1 capsule (0.4 mg total) daily by mouth.  . warfarin (COUMADIN) 2.5 MG tablet TAKE 1 TABLET DAILY EXCEPT TAKE 2 TABLETS ON TUESDAY OR TAKE AS DIRECTED BY ANTICOAGULATION CLINIC   No current facility-administered medications for this visit. (Other)      REVIEW OF SYSTEMS:    ALLERGIES No Known Allergies  PAST MEDICAL HISTORY Past Medical History:  Diagnosis Date  . Anal fissure 06/11/2009  . Atrial fibrillation  ([place]) 06/11/2009  . HYPERTENSION 06/11/2009  . NECK MASS 10/01/2010  . Prostate cancer [facility])    Past Surgical History:  Procedure Laterality Date  . CARPAL TUNNEL RELEASE     Both hands  . CYST REMOVAL HAND     Left hand  . CYSTOSCOPY  2000   hand  . PROSTATE BIOPSY      FAMILY HISTORY Family History  Problem Relation Age of Onset  . Cancer Sister 56       unknown type  . Cancer Brother        prostate/seed implant with [name]/died 01/30/14 of heart attack    SOCIAL HISTORY Social History   Tobacco Use  . Smoking status: Former Smoker    Years: 50.00    Types: Cigars    Quit date: 08/08/1991    Years since quitting: 28.9  . Smokeless tobacco: Never Used  Vaping Use  . Vaping Use: Never used  Substance Use Topics  . Alcohol use: No  . Drug use: No         OPHTHALMIC EXAM:  Base Eye Exam    Visual Acuity (ETDRS)      Right Left   Dist cc E card @ 3' 20/40 +2   Dist ph cc NI NI       Tonometry (Tonopen, 9:34 AM)      Right Left  Pressure 17 19       Pupils      Pupils Dark Light Shape React APD   Right PERRL 5 4 Round Brisk None   Left PERRL 5 4 Round Brisk None       Visual Fields (Counting fingers)      Left Right    Full Full       Extraocular Movement      Right Left    Full Full       Neuro/Psych    Oriented x3: Yes   Mood/Affect: Normal       Dilation    Left eye: 1.0% Mydriacyl, 2.5% Phenylephrine @ 9:38 AM        Slit Lamp and Fundus Exam    External Exam      Right Left   External Normal Normal       Slit Lamp Exam      Right Left   Lids/Lashes Normal Normal   Conjunctiva/Sclera White and quiet White and quiet   Cornea Clear Clear   Anterior Chamber Deep and quiet Deep and quiet   Iris Round and reactive Round and reactive   Lens Centered posterior chamber intraocular lens Centered posterior chamber intraocular lens   Anterior Vitreous Normal Normal       Fundus Exam      Right Left   Posterior Vitreous  Posterior  vitreous detachment, Central vitreous floaters   Disc  Peripapillary atrophy   C/D Ratio  0.15   Macula  Subretinal hemorrhage temporal to the fovea,,, Disciform scar with large mature choroidal vessels as the new blood supply to the macula, stable for years, Retinal pigment epithelial atrophy, Geographic atrophy   Vessels  Normal   Periphery  Normal          IMAGING AND PROCEDURES  Imaging and Procedures for 07/14/20  OCT, Retina - OU - Both Eyes       Right Eye Quality was good. Scan locations included subfoveal. Central Foveal Thickness: 326. Progression has been stable. Findings include abnormal foveal contour, central retinal atrophy, outer retinal atrophy, preretinal fibrosis.   Left Eye Quality was good. Scan locations included subfoveal. Central Foveal Thickness: 362. Progression has been stable. Findings include abnormal foveal contour, choroidal neovascular membrane, disciform scar.   Notes OS, chronic active disciform scar with mature CNVM acting as the new blood supply to the abdomen stable now for years       Intravitreal Injection, Pharmacologic Agent - OS - Left Eye       Time Out 07/14/2020. 11:12 AM. Confirmed correct patient, procedure, site, and patient consented.   Anesthesia Topical anesthesia was used. Anesthetic medications included [place] 3.5%.   Procedure Preparation included Tobramycin 0.3%, 10% betadine to eyelids, 5% betadine to ocular surface. A supplied needle was used.   Injection:  1.25 mg Bevacizumab (AVASTIN) SOLN   NDC: [address]-0, Lot: [id number]   Route: Intravitreal, Site: Left Eye, Waste: 0 mg  Post-op Post injection exam found visual acuity of at least counting fingers. The patient tolerated the procedure well. There were no complications. The patient received written and verbal post procedure care education. Post injection medications were not given.                 ASSESSMENT/PLAN:  Exudative age-related macular  degeneration of left eye with active choroidal neovascularization (HCC) OS, chronic active CNVM with encroachment of scarring into the retina yet stable over the last Decade.  Less active compared  to last visit  Exudative age-related macular degeneration of right eye with inactive choroidal neovascularization (HCC) Stable OD and no change      ICD-10-CM   1. Exudative age-related macular degeneration of left eye with active choroidal neovascularization (HCC)  H35.3221 OCT, Retina - OU - Both Eyes    Intravitreal Injection, Pharmacologic Agent - OS - Left Eye    Bevacizumab (AVASTIN) SOLN 1.25 mg  2. Exudative age-related macular degeneration of right eye with inactive choroidal neovascularization ([place])  H35.3212     1.  2.  3.  Ophthalmic Meds Ordered this visit:  Meds ordered this encounter  Medications  . Bevacizumab (AVASTIN) SOLN 1.25 mg       Return in about 6 weeks (around 08/25/2020) for dilate, OS, AVASTIN OCT.  There are no Patient Instructions on file for this visit.   Explained the diagnoses, plan, and follow up with the patient and they expressed understanding.  Patient expressed understanding of the importance of proper follow up care.   Clent Demark Finis Hendricksen M.D. Diseases & Surgery of the Retina and Vitreous [facility] [place] 07/14/20     Abbreviations: M myopia (nearsighted); A astigmatism; H hyperopia (farsighted); P presbyopia; Mrx spectacle prescription;  CTL contact lenses; OD right eye; OS left eye; OU both eyes  XT exotropia; ET esotropia; PEK punctate epithelial keratitis; PEE punctate epithelial erosions; DES dry eye syndrome; MGD meibomian gland dysfunction; ATs artificial tears; PFAT's preservative free artificial tears; [place] nuclear sclerotic cataract; PSC posterior subcapsular cataract; ERM epi-retinal membrane; PVD posterior vitreous detachment; RD retinal detachment; DM diabetes mellitus; DR diabetic retinopathy; NPDR non-proliferative  diabetic retinopathy; PDR proliferative diabetic retinopathy; CSME clinically significant macular edema; DME diabetic macular edema; dbh dot blot hemorrhages; CWS cotton wool spot; POAG primary open angle glaucoma; C/D cup-to-disc ratio; HVF humphrey visual field; GVF goldmann visual field; OCT optical coherence tomography; IOP intraocular pressure; BRVO Branch retinal vein occlusion; CRVO central retinal vein occlusion; CRAO central retinal artery occlusion; BRAO branch retinal artery occlusion; RT retinal tear; SB scleral buckle; PPV pars plana vitrectomy; VH Vitreous hemorrhage; PRP panretinal laser photocoagulation; IVK intravitreal kenalog; VMT vitreomacular traction; MH Macular hole;  NVD neovascularization of the disc; NVE neovascularization elsewhere; AREDS age related eye disease study; ARMD age related macular degeneration; POAG primary open angle glaucoma; EBMD epithelial/anterior basement membrane dystrophy; ACIOL anterior chamber intraocular lens; IOL intraocular lens; PCIOL posterior chamber intraocular lens; Phaco/IOL phacoemulsification with intraocular lens placement; [place] photorefractive keratectomy; LASIK laser assisted in situ keratomileusis; HTN hypertension; DM diabetes mellitus; COPD chronic obstructive pulmonary disease

## 2020-07-27 ENCOUNTER — Ambulatory Visit (INDEPENDENT_AMBULATORY_CARE_PROVIDER_SITE_OTHER): Payer: Medicare Other | Admitting: General Practice

## 2020-07-27 ENCOUNTER — Other Ambulatory Visit: Payer: Self-pay

## 2020-07-27 DIAGNOSIS — I4891 Unspecified atrial fibrillation: Secondary | ICD-10-CM

## 2020-07-27 DIAGNOSIS — Z7901 Long term (current) use of anticoagulants: Secondary | ICD-10-CM

## 2020-07-27 LAB — POCT INR: INR: 2.5 (ref 2.0–3.0)

## 2020-07-27 NOTE — Patient Instructions (Addendum)
Pre visit review using our clinic review tool, if applicable. No additional management support is needed unless otherwise documented below in the visit note.  Continue to take 1 tablet daily except 2 tablets every Monday. Re-check in 5 to 6 weeks.  

## 2020-08-09 ENCOUNTER — Emergency Department (HOSPITAL_COMMUNITY): Payer: Medicare Other

## 2020-08-09 ENCOUNTER — Emergency Department (HOSPITAL_COMMUNITY)
Admission: EM | Admit: 2020-08-09 | Discharge: 2020-08-09 | Disposition: A | Discharge status: Home / Self Care | Payer: Medicare Other | Attending: Emergency Medicine | Admitting: Emergency Medicine

## 2020-08-09 ENCOUNTER — Encounter (HOSPITAL_COMMUNITY): Payer: Self-pay | Admitting: *Deleted

## 2020-08-09 DIAGNOSIS — S5002XA Contusion of left elbow, initial encounter: Secondary | ICD-10-CM | POA: Diagnosis not present

## 2020-08-09 DIAGNOSIS — S3992XA Unspecified injury of lower back, initial encounter: Secondary | ICD-10-CM | POA: Diagnosis present

## 2020-08-09 DIAGNOSIS — Z8546 Personal history of malignant neoplasm of prostate: Secondary | ICD-10-CM | POA: Insufficient documentation

## 2020-08-09 DIAGNOSIS — M19012 Primary osteoarthritis, left shoulder: Secondary | ICD-10-CM | POA: Diagnosis not present

## 2020-08-09 DIAGNOSIS — S300XXA Contusion of lower back and pelvis, initial encounter: Secondary | ICD-10-CM | POA: Diagnosis not present

## 2020-08-09 DIAGNOSIS — M25552 Pain in left hip: Secondary | ICD-10-CM | POA: Diagnosis not present

## 2020-08-09 DIAGNOSIS — Z79899 Other long term (current) drug therapy: Secondary | ICD-10-CM | POA: Insufficient documentation

## 2020-08-09 DIAGNOSIS — S51012A Laceration without foreign body of left elbow, initial encounter: Secondary | ICD-10-CM

## 2020-08-09 DIAGNOSIS — W010XXA Fall on same level from slipping, tripping and stumbling without subsequent striking against object, initial encounter: Secondary | ICD-10-CM | POA: Insufficient documentation

## 2020-08-09 DIAGNOSIS — I10 Essential (primary) hypertension: Secondary | ICD-10-CM | POA: Insufficient documentation

## 2020-08-09 DIAGNOSIS — M25522 Pain in left elbow: Secondary | ICD-10-CM | POA: Diagnosis not present

## 2020-08-09 DIAGNOSIS — Z87891 Personal history of nicotine dependence: Secondary | ICD-10-CM | POA: Diagnosis not present

## 2020-08-09 DIAGNOSIS — I639 Cerebral infarction, unspecified: Secondary | ICD-10-CM

## 2020-08-09 DIAGNOSIS — Z7901 Long term (current) use of anticoagulants: Secondary | ICD-10-CM | POA: Diagnosis not present

## 2020-08-09 HISTORY — DX: Cerebral infarction, unspecified: I63.9

## 2020-08-09 LAB — PROTIME-INR
INR: 2.2 — ABNORMAL HIGH (ref 0.8–1.2)
Prothrombin Time: 23.8 seconds — ABNORMAL HIGH (ref 11.4–15.2)

## 2020-08-09 MED ORDER — BACITRACIN ZINC 500 UNIT/GM EX OINT
1.0000 "application " | TOPICAL_OINTMENT | Freq: Two times a day (BID) | CUTANEOUS | Status: DC
  Administered 2020-08-09: 1 via TOPICAL
  Filled 2020-08-09: qty 0.9

## 2020-08-09 NOTE — Discharge Instructions (Signed)
The x-rays reveal no signs of broken bones, you do have a swollen buttock which is likely related to being on a blood thinner.  This may very well get much worse before it gets better.  If you find that you are having significant or severe swelling or if the swelling is proceeding down your leg please see your family doctor as you may need an ultrasound of your leg.  Do not change the doses of your medications, you may take Tylenol as needed for pain, ice pack to the buttock to help prevent more swelling.  Emergency department for severe worsening symptoms

## 2020-08-09 NOTE — ED Triage Notes (Signed)
Hung his foot in a rug this am and tripped. Pain in left hip

## 2020-08-09 NOTE — ED Notes (Signed)
Skin tear to left elbow.  Area clean and ointment applied as ordered.

## 2020-08-09 NOTE — ED Provider Notes (Signed)
[facility] EMERGENCY DEPARTMENT Provider Note   CSN: [id number] Arrival date & time: 08/09/20  01-26-13     History Chief Complaint  Patient presents with  . Fall    Mark Wu is a 86 y.o. male.  HPI   This patient is a very pleasant 86 year old male, he has a history of atrial fibrillation currently taking warfarin, metoprolol and amlodipine as his most notable medications.  He states that when he was walking this morning coming out of the bathroom his foot got hung up on a rug, he fell to the ground striking his left buttock on the ground.  He denies head injury loss of consciousness and was able to get up by himself and walk but because of pain in the left buttock with some swelling he came to get checked.  Symptoms are persistent, mild, worse with palpation over the left buttock.  He initially went to the clinic in [place], he was told to come here because they did not have x-ray capability  Past Medical History:  Diagnosis Date  . Anal fissure 06/11/2009  . Atrial fibrillation ([place]) 06/11/2009  . HYPERTENSION 06/11/2009  . NECK MASS 10/01/2010  . Prostate cancer [facility])     Patient Active Problem List   Diagnosis Date Noted  . Exudative age-related macular degeneration of left eye with active choroidal neovascularization ([place]) 03/24/2020  . Secondary open-angle glaucoma, left, moderate stage 03/24/2020  . Retinal neovascularization of left eye 03/24/2020  . Exudative age-related macular degeneration of right eye with inactive choroidal neovascularization ([place]) 03/24/2020  . Long term (current) use of anticoagulants 07/10/2017  . Prostate cancer ([place]) 05/29/2017  . GERD (gastroesophageal reflux disease) 11/14/2016  . Encounter for therapeutic drug monitoring 02/23/2015  . Macular degeneration 08/08/2011  . Hearing loss 08/08/2011  . NECK MASS 10/01/2010  . Essential hypertension 06/11/2009  . ATRIAL FIBRILLATION 06/11/2009    Past Surgical History:  Procedure Laterality  Date  . CARPAL TUNNEL RELEASE     Both hands  . CYST REMOVAL HAND     Left hand  . CYSTOSCOPY  2000   hand  . PROSTATE BIOPSY         Family History  Problem Relation Age of Onset  . Cancer Sister 54       unknown type  . Cancer Brother        prostate/seed implant with [name]/died 01-26-2014 of heart attack    Social History   Tobacco Use  . Smoking status: Former Smoker    Years: 50.00    Types: Cigars    Quit date: 08/08/1991    Years since quitting: 29.0  . Smokeless tobacco: Never Used  Vaping Use  . Vaping Use: Never used  Substance Use Topics  . Alcohol use: No  . Drug use: No    Home Medications Prior to Admission medications   Medication Sig Start Date End Date Taking? Authorizing Provider  amLODipine (NORVASC) 5 MG tablet TAKE 1 TABLET DAILY (NEED PHYSICAL) 03/20/20   Burchette, [name], MD  Calcium Carb-Cholecalciferol (CALCIUM CARBONATE-VITAMIN D3 PO) Take by mouth.    [provider]  clotrimazole-betamethasone (LOTRISONE) cream Apply topically as needed. 07/03/19   Burchette, [name], MD  latanoprost (XALATAN) 0.005 % ophthalmic solution INSTILL 1 DROP IN THE LEFT EYE NIGHTLY. (DISCARD 42 DAYS AFTER OPENING) 03/23/20   Rankin, Clent Demark, MD  metoprolol succinate (TOPROL-XL) 25 MG 24 hr tablet TAKE 1 TABLET DAILY 06/27/20   Burchette, [name], MD  tamsulosin (  FLOMAX) 0.4 MG CAPS capsule Take 1 capsule (0.4 mg total) daily by mouth. 09/04/17   Burchette, [name], MD  warfarin (COUMADIN) 2.5 MG tablet TAKE 1 TABLET DAILY EXCEPT TAKE 2 TABLETS ON TUESDAY OR TAKE AS DIRECTED BY ANTICOAGULATION CLINIC 06/02/20   Burchette, [name], MD    Allergies    Patient has no known allergies.  Review of Systems   Review of Systems  All other systems reviewed and are negative.   Physical Exam Updated Vital Signs BP (!) 160/80 (BP Location: Right Arm)   Pulse 70   Temp (!) 97.1 F (36.2 C) (Temporal) Comment: could not get a oral temp  Resp 20   Ht 1.753 m (5\' 9" )    Wt 78.5 kg   SpO2 100%   BMI 25.55 kg/m   Physical Exam Vitals and nursing note reviewed.  Constitutional:      General: He is not in acute distress.    Appearance: He is well-developed.  HENT:     Head: Normocephalic and atraumatic.     Mouth/Throat:     Pharynx: No oropharyngeal exudate.  Eyes:     General: No scleral icterus.       Right eye: No discharge.        Left eye: No discharge.     Conjunctiva/sclera: Conjunctivae normal.     Pupils: Pupils are equal, round, and reactive to light.  Neck:     Thyroid: No thyromegaly.     Vascular: No JVD.  Cardiovascular:     Rate and Rhythm: Normal rate and regular rhythm.     Heart sounds: Normal heart sounds. No murmur heard.  No friction rub. No gallop.   Pulmonary:     Effort: Pulmonary effort is normal. No respiratory distress.     Breath sounds: Normal breath sounds. No wheezing or rales.  Abdominal:     General: Bowel sounds are normal. There is no distension.     Palpations: Abdomen is soft. There is no mass.     Tenderness: There is no abdominal tenderness.  Musculoskeletal:        General: Swelling and tenderness present. Normal range of motion.     Cervical back: Normal range of motion and neck supple.     Comments: The patient is able to straight leg raise bilaterally with both of his legs, he has some tenderness with palpation over the left buttock where there is a large hematoma posteriorly.  There is no tenderness over the pelvic bones, he is able to move full range of motion with both upper extremities though there is a skin tear over the lateral left elbow  Lymphadenopathy:     Cervical: No cervical adenopathy.  Skin:    General: Skin is warm and dry.     Findings: Bruising and lesion present. No erythema or rash.     Comments: Left lateral elbow skin tear  Neurological:     Mental Status: He is alert.     Coordination: Coordination normal.  Psychiatric:        Behavior: Behavior normal.     ED Results  / Procedures / Treatments   Labs (all labs ordered are listed, but only abnormal results are displayed) Labs Reviewed - No data to display  EKG None  Radiology No results found.  Procedures Procedures (including critical care time)  Medications Ordered in ED Medications - No data to display  ED Course  I have reviewed the triage vital signs and  the nursing notes.  Pertinent labs & imaging results that were available during my care of the patient were reviewed by me and considered in my medical decision making (see chart for details).  Clinical Course as of Aug 10 1423  Sun Aug 09, 2020  1421 All imaging is negative, patient stable for discharge, INR is appropriate   [BM]    Clinical Course User Index [BM] Noemi Chapel, MD   MDM Rules/Calculators/A&P                          Local wound care to the left elbow, image the pelvis to make sure there is no fractures of the pelvis or of the hip, the patient is ambulatory and I suspect this is just a hematoma.  The patient is on Coumadin, will check INR  Imaging negative, patient and family members counseled, stable for discharge  Final Clinical Impression(s) / ED Diagnoses Final diagnoses:  None    Rx / DC Orders ED Discharge Orders    None       Noemi Chapel, MD 08/09/20 1424

## 2020-08-13 DIAGNOSIS — C61 Malignant neoplasm of prostate: Secondary | ICD-10-CM | POA: Diagnosis not present

## 2020-08-16 ENCOUNTER — Other Ambulatory Visit: Payer: Self-pay

## 2020-08-16 ENCOUNTER — Observation Stay (HOSPITAL_COMMUNITY): Payer: Medicare Other

## 2020-08-16 ENCOUNTER — Encounter (HOSPITAL_COMMUNITY): Payer: Self-pay

## 2020-08-16 ENCOUNTER — Emergency Department (HOSPITAL_COMMUNITY): Payer: Medicare Other

## 2020-08-16 ENCOUNTER — Observation Stay (HOSPITAL_COMMUNITY)
Admission: EM | Admit: 2020-08-16 | Discharge: 2020-08-17 | Disposition: A | Discharge status: Home / Self Care | Payer: Medicare Other | Attending: Internal Medicine | Admitting: Internal Medicine

## 2020-08-16 DIAGNOSIS — C61 Malignant neoplasm of prostate: Secondary | ICD-10-CM | POA: Diagnosis present

## 2020-08-16 DIAGNOSIS — Z20822 Contact with and (suspected) exposure to covid-19: Secondary | ICD-10-CM | POA: Insufficient documentation

## 2020-08-16 DIAGNOSIS — I10 Essential (primary) hypertension: Secondary | ICD-10-CM | POA: Diagnosis not present

## 2020-08-16 DIAGNOSIS — M79661 Pain in right lower leg: Secondary | ICD-10-CM | POA: Diagnosis not present

## 2020-08-16 DIAGNOSIS — E871 Hypo-osmolality and hyponatremia: Secondary | ICD-10-CM | POA: Diagnosis not present

## 2020-08-16 DIAGNOSIS — G319 Degenerative disease of nervous system, unspecified: Secondary | ICD-10-CM | POA: Diagnosis not present

## 2020-08-16 DIAGNOSIS — Z7901 Long term (current) use of anticoagulants: Secondary | ICD-10-CM | POA: Insufficient documentation

## 2020-08-16 DIAGNOSIS — Z043 Encounter for examination and observation following other accident: Secondary | ICD-10-CM | POA: Diagnosis not present

## 2020-08-16 DIAGNOSIS — I4891 Unspecified atrial fibrillation: Secondary | ICD-10-CM | POA: Diagnosis not present

## 2020-08-16 DIAGNOSIS — Z8546 Personal history of malignant neoplasm of prostate: Secondary | ICD-10-CM | POA: Insufficient documentation

## 2020-08-16 DIAGNOSIS — Z79899 Other long term (current) drug therapy: Secondary | ICD-10-CM | POA: Insufficient documentation

## 2020-08-16 DIAGNOSIS — Z87891 Personal history of nicotine dependence: Secondary | ICD-10-CM | POA: Diagnosis not present

## 2020-08-16 DIAGNOSIS — I6782 Cerebral ischemia: Secondary | ICD-10-CM | POA: Diagnosis not present

## 2020-08-16 DIAGNOSIS — M545 Low back pain, unspecified: Secondary | ICD-10-CM | POA: Diagnosis not present

## 2020-08-16 DIAGNOSIS — R202 Paresthesia of skin: Secondary | ICD-10-CM | POA: Diagnosis not present

## 2020-08-16 DIAGNOSIS — R200 Anesthesia of skin: Secondary | ICD-10-CM | POA: Diagnosis not present

## 2020-08-16 LAB — CBC WITH DIFFERENTIAL/PLATELET
Abs Immature Granulocytes: 0.03 10*3/uL (ref 0.00–0.07)
Basophils Absolute: 0 10*3/uL (ref 0.0–0.1)
Basophils Relative: 0 %
Eosinophils Absolute: 0.1 10*3/uL (ref 0.0–0.5)
Eosinophils Relative: 1 %
HCT: 32.4 % — ABNORMAL LOW (ref 39.0–52.0)
Hemoglobin: 10.4 g/dL — ABNORMAL LOW (ref 13.0–17.0)
Immature Granulocytes: 0 %
Lymphocytes Relative: 17 %
Lymphs Abs: 1.3 10*3/uL (ref 0.7–4.0)
MCH: 28.3 pg (ref 26.0–34.0)
MCHC: 32.1 g/dL (ref 30.0–36.0)
MCV: 88 fL (ref 80.0–100.0)
Monocytes Absolute: 1.3 10*3/uL — ABNORMAL HIGH (ref 0.1–1.0)
Monocytes Relative: 17 %
Neutro Abs: 4.9 10*3/uL (ref 1.7–7.7)
Neutrophils Relative %: 65 %
Platelets: 299 10*3/uL (ref 150–400)
RBC: 3.68 MIL/uL — ABNORMAL LOW (ref 4.22–5.81)
RDW: 17.2 % — ABNORMAL HIGH (ref 11.5–15.5)
WBC: 7.6 10*3/uL (ref 4.0–10.5)
nRBC: 0 % (ref 0.0–0.2)

## 2020-08-16 LAB — BASIC METABOLIC PANEL
Anion gap: 8 (ref 5–15)
BUN: 15 mg/dL (ref 8–23)
CO2: 22 mmol/L (ref 22–32)
Calcium: 8 mg/dL — ABNORMAL LOW (ref 8.9–10.3)
Chloride: 89 mmol/L — ABNORMAL LOW (ref 98–111)
Creatinine, Ser: 0.82 mg/dL (ref 0.61–1.24)
GFR, Estimated: >60 mL/min (ref >60)
Glucose, Bld: 94 mg/dL (ref 70–99)
Potassium: 4.4 mmol/L (ref 3.5–5.1)
Sodium: 119 mmol/L — CL (ref 135–145)

## 2020-08-16 LAB — TSH: TSH: 1.018 u[IU]/mL (ref 0.350–4.500)

## 2020-08-16 LAB — URINALYSIS, ROUTINE W REFLEX MICROSCOPIC
Bilirubin Urine: NEGATIVE
Glucose, UA: NEGATIVE mg/dL
Hgb urine dipstick: NEGATIVE
Ketones, ur: NEGATIVE mg/dL
Leukocytes,Ua: NEGATIVE
Nitrite: NEGATIVE
Protein, ur: NEGATIVE mg/dL
Specific Gravity, Urine: 1.003 — ABNORMAL LOW (ref 1.005–1.030)
pH: 8 (ref 5.0–8.0)

## 2020-08-16 LAB — SODIUM, URINE, RANDOM: Sodium, Ur: 31 mmol/L

## 2020-08-16 LAB — RESPIRATORY PANEL BY RT PCR (FLU A&B, COVID)
Influenza A by PCR: NEGATIVE
Influenza B by PCR: NEGATIVE
SARS Coronavirus 2 by RT PCR: NEGATIVE

## 2020-08-16 LAB — OSMOLALITY: Osmolality: 286 mOsm/kg (ref 275–295)

## 2020-08-16 LAB — PROTIME-INR
INR: 1.8 — ABNORMAL HIGH (ref 0.8–1.2)
Prothrombin Time: 20 seconds — ABNORMAL HIGH (ref 11.4–15.2)

## 2020-08-16 LAB — D-DIMER, QUANTITATIVE: D-Dimer, Quant: 1.29 ug/mL-FEU — ABNORMAL HIGH (ref 0.00–0.50)

## 2020-08-16 MED ORDER — POLYETHYLENE GLYCOL 3350 17 G PO PACK: 17.0000 g | PACK | Freq: Every day | ORAL | Status: DC | PRN

## 2020-08-16 MED ORDER — TAMSULOSIN HCL 0.4 MG PO CAPS
0.4000 mg | ORAL_CAPSULE | Freq: Every day | ORAL | Status: DC
  Filled 2020-08-16: qty 1

## 2020-08-16 MED ORDER — WARFARIN - PHARMACIST DOSING INPATIENT: Freq: Every day | Status: DC

## 2020-08-16 MED ORDER — ONDANSETRON HCL 4 MG PO TABS: 4.0000 mg | ORAL_TABLET | Freq: Four times a day (QID) | ORAL | Status: DC | PRN

## 2020-08-16 MED ORDER — WARFARIN SODIUM 2.5 MG PO TABS
2.5000 mg | ORAL_TABLET | Freq: Once | ORAL | Status: AC
  Administered 2020-08-16: 2.5 mg via ORAL
  Filled 2020-08-16: qty 1

## 2020-08-16 MED ORDER — TRAMADOL HCL 50 MG PO TABS
50.0000 mg | ORAL_TABLET | Freq: Once | ORAL | Status: AC
  Administered 2020-08-16: 50 mg via ORAL
  Filled 2020-08-16: qty 1

## 2020-08-16 MED ORDER — TRAMADOL HCL 50 MG PO TABS: 50.0000 mg | ORAL_TABLET | Freq: Three times a day (TID) | ORAL | Status: DC | PRN

## 2020-08-16 MED ORDER — ONDANSETRON HCL 4 MG/2ML IJ SOLN: 4.0000 mg | Freq: Four times a day (QID) | INTRAMUSCULAR | Status: DC | PRN

## 2020-08-16 MED ORDER — SODIUM CHLORIDE 0.9 % IV BOLUS
500.0000 mL | Freq: Once | INTRAVENOUS | Status: AC
  Administered 2020-08-16: 500 mL via INTRAVENOUS

## 2020-08-16 MED ORDER — AMLODIPINE BESYLATE 5 MG PO TABS
5.0000 mg | ORAL_TABLET | Freq: Every day | ORAL | Status: DC
  Filled 2020-08-16: qty 1

## 2020-08-16 MED ORDER — SODIUM CHLORIDE 0.9 % IV SOLN: INTRAVENOUS | Status: DC

## 2020-08-16 NOTE — H&P (Signed)
History and Physical    Mark Wu WUX:[id number] DOB: 19-Jan-1926 DOA: 08/16/2020  PCP: [name], MD   Patient coming from: Home  I have personally briefly reviewed patient's old medical records in [place]  Chief Complaint: Right leg numbness.  HPI: Mark Wu is a 86 y.o. male with medical history significant for atrial fibrillation, hypertension, prostate cancer, neck mass. Patient presented to the ED with complaints of right lower leg numbness involving his knee down to his foot, that started at about 10:00 today after he sat down for breakfast.  Patient reports he got up and he could not feel his right lower extremity, it felt numb.  He denies weakness or numbness involving any other extremity, no facial asymmetry or change in speech.  At the time of my evaluation, patient tells me he still has some numbness in his right lower extremity. Patient denies vomiting, no loose stools.  He has maintained good oral intake.  He is not on diuretics. Patient denies chest pain, no difficulty breathing.  He has a mild chronic cough. Patient reports he fell, 10/24 for which was seen in the ED.  He fell when he was coming out of the bathroom, and his foot got hung up on a rug.  Did not hit his head.  He fell on his left buttock.  Imaging included left shoulder and left elbow x-ray, and a pelvic x-ray which was unremarkable. Patient had a large hematoma on his left buttock posteriorly where he had falling, so he was told not to take his Coumadin for 2 days. Patient denies frequent falls, he ambulates with a cane and has a wheelchair at home.  He has not fallen since 10/24.  ED Course: Heart rate 40s to 60s, temperature 97.9.  Blood pressure 120s to 150s, O2 sat 100% on room air.  Sodium 119.  Potassium 4.4, stable creatinine and BUN.  INR 1.8.  D-dimer 1.29.  UA unremarkable.  Two-view chest x-ray shows left base scarring.  Number spine x-ray shows moderate to severe degenerative  changes.  Right tibia/ fibula ankle x-ray unremarkable. 500 mill bolus given.  Hospitalist admit for hyponatremia.  Review of Systems: As per HPI all other systems reviewed and negative.  Past Medical History:  Diagnosis Date  . Anal fissure 06/11/2009  . Atrial fibrillation ([place]) 06/11/2009  . HYPERTENSION 06/11/2009  . NECK MASS 10/01/2010  . Prostate cancer [facility])     Past Surgical History:  Procedure Laterality Date  . CARPAL TUNNEL RELEASE     Both hands  . CYST REMOVAL HAND     Left hand  . CYSTOSCOPY  2000   hand  . PROSTATE BIOPSY       reports that he quit smoking about 29 years ago. His smoking use included cigars. He quit after 50.00 years of use. He has never used smokeless tobacco. He reports that he does not drink alcohol and does not use drugs.  No Known Allergies  Family History  Problem Relation Age of Onset  . Cancer Sister 21       unknown type  . Cancer Brother        prostate/seed implant with [name]/died 2014/01/29 of heart attack    Prior to Admission medications   Medication Sig Start Date End Date Taking? Authorizing Provider  amLODipine (NORVASC) 5 MG tablet TAKE 1 TABLET DAILY (NEED PHYSICAL) Patient taking differently: Take 5 mg by mouth daily.  03/20/20   Burchette, [name], MD  Calcium  Carb-Cholecalciferol (CALCIUM CARBONATE-VITAMIN D3 PO) Take by mouth.    [provider]  clotrimazole-betamethasone (LOTRISONE) cream Apply topically as needed. 07/03/19   Burchette, [name], MD  latanoprost (XALATAN) 0.005 % ophthalmic solution INSTILL 1 DROP IN THE LEFT EYE NIGHTLY. (DISCARD 42 DAYS AFTER OPENING) Patient taking differently: Place 1 drop into the left eye at bedtime.  03/23/20   Rankin, Clent Demark, MD  metoprolol succinate (TOPROL-XL) 25 MG 24 hr tablet TAKE 1 TABLET DAILY 06/27/20   Burchette, [name], MD  tamsulosin (FLOMAX) 0.4 MG CAPS capsule Take 1 capsule (0.4 mg total) daily by mouth. 09/04/17   Burchette, [name], MD  warfarin (COUMADIN) 2.5  MG tablet TAKE 1 TABLET DAILY EXCEPT TAKE 2 TABLETS ON TUESDAY OR TAKE AS DIRECTED BY ANTICOAGULATION CLINIC 06/02/20   [name], MD    Physical Exam: Vitals:   08/16/20 1226 08/16/20 1434 08/16/20 1439  BP: (!) 158/52 138/69   Pulse: (!) 52  62  Resp: 16  18  Temp: 97.9 F (36.6 C)    TempSrc: Oral    SpO2: 100%    Weight: 78.5 kg    Height: 5\' 9"  (1.753 m)      Constitutional: NAD, calm, comfortable, hard of hearing Vitals:   08/16/20 1226 08/16/20 1434 08/16/20 1439  BP: (!) 158/52 138/69   Pulse: (!) 52  62  Resp: 16  18  Temp: 97.9 F (36.6 C)    TempSrc: Oral    SpO2: 100%    Weight: 78.5 kg    Height: 5\' 9"  (1.753 m)     Eyes: PERRL, lids and conjunctivae normal ENMT: Mucous membranes are moist.  Neck: normal, supple, no masses, no thyromegaly Respiratory: clear to auscultation bilaterally, no wheezing, no crackles. Normal respiratory effort. No accessory muscle use.  Cardiovascular: Bradycardic, appears regular rate and rhythm, no murmurs / rubs / gallops. No extremity edema. 2+ pedal pulses. Abdomen: no tenderness, no masses palpated. No hepatosplenomegaly. Bowel sounds positive.  Musculoskeletal: no clubbing / cyanosis. No joint deformity upper and lower extremities. Good ROM, no contractures. Normal muscle tone.  Skin: no rashes, lesions, ulcers. No induration Neurologic: No facial asymmetry, speech fluent and coherent without aphasia,. Sensation intact-  all extremities. Strength 5/5 in all 4.  Psychiatric: Normal judgment and insight. Alert and oriented x 3. Normal mood.   Labs on Admission: I have personally reviewed following labs and imaging studies  CBC: Recent Labs  Lab 08/16/20 1249  WBC 7.6  NEUTROABS 4.9  HGB 10.4*  HCT 32.4*  MCV 88.0  PLT 366   Basic Metabolic Panel: Recent Labs  Lab 08/16/20 1249  NA 119*  K 4.4  CL 89*  CO2 22  GLUCOSE 94  BUN 15  CREATININE 0.82  CALCIUM 8.0*   Coagulation Profile: Recent Labs    Lab 08/16/20 1249  INR 1.8*   Urine analysis:    Component Value Date/Time   COLORURINE COLORLESS (A) 08/16/2020 1404   APPEARANCEUR CLEAR 08/16/2020 1404   LABSPEC 1.003 (L) 08/16/2020 1404   PHURINE 8.0 08/16/2020 1404   GLUCOSEU NEGATIVE 08/16/2020 1404   HGBUR NEGATIVE 08/16/2020 1404   BILIRUBINUR NEGATIVE 08/16/2020 1404   BILIRUBINUR neg 09/05/2013 1007   KETONESUR NEGATIVE 08/16/2020 1404   PROTEINUR NEGATIVE 08/16/2020 1404   UROBILINOGEN 0.2 09/05/2013 1007   NITRITE NEGATIVE 08/16/2020 1404   LEUKOCYTESUR NEGATIVE 08/16/2020 1404    Radiological Exams on Admission: DG Chest 2 View  Result Date: 08/16/2020 CLINICAL DATA:  Fall EXAM: CHEST - 2 VIEW COMPARISON:  None. FINDINGS: Normal heart size. Calcified plaque the thoracic aorta. Minimal atelectasis/scarring at the left lung base. No pleural effusion or pneumothorax. No acute osseous abnormality. IMPRESSION: Minimal atelectasis/scarring at the left lung base. Electronically Signed   By: Macy Mis M.D.   On: 08/16/2020 14:46   DG Lumbar Spine Complete  Result Date: 08/16/2020 CLINICAL DATA:  Pain with right lower extremity numbness. EXAM: LUMBAR SPINE - COMPLETE 4+ VIEW COMPARISON:  CT abdomen pelvis dated 05/19/2017. FINDINGS: There is no evidence of lumbar spine fracture. Alignment is normal. There is moderate degenerative disc and severe degenerative joint disease in the lumbar spine. Moderate degenerative changes are seen in both hips. IMPRESSION: Negative for acute fracture. Moderate to severe degenerative changes in the lumbar spine. Electronically Signed   By: Zerita Boers M.D.   On: 08/16/2020 14:01   DG Tibia/Fibula Right  Result Date: 08/16/2020 CLINICAL DATA:  Right lower leg pain. EXAM: RIGHT TIBIA AND FIBULA - 2 VIEW COMPARISON:  None. FINDINGS: There is no evidence of fracture. A sclerotic lesion in the right tibial metadiaphysis likely represents an old fibrous cortical lesion. Soft tissues are  unremarkable. IMPRESSION: No acute osseous injury. Electronically Signed   By: Zerita Boers M.D.   On: 08/16/2020 14:06   DG Ankle Complete Right  Result Date: 08/16/2020 CLINICAL DATA:  Right lower leg pain. EXAM: RIGHT ANKLE - COMPLETE 3+ VIEW COMPARISON:  None. FINDINGS: There is no evidence of fracture, dislocation, or joint effusion. There is no evidence of arthropathy or other focal bone abnormality. Vascular calcifications noted. IMPRESSION: 1. No acute fracture or dislocation identified about the right ankle. 2. Vascular calcifications. Electronically Signed   By: Fidela Salisbury M.D.   On: 08/16/2020 13:51    EKG: Independently reviewed.  Atrial flutter rate 50, QTc 429.  No significant ST or T wave abnormalities.  4:1 AV block- Per EKG device.  Assessment/Plan Principal Problem:   Hyponatremia Active Problems:   Essential hypertension   ATRIAL FIBRILLATION   Prostate cancer ([place])   Long term (current) use of anticoagulants  Hyponatremia-sodium 119.  Unknown recent baseline, as last BMP 3 years ago was 138.  Likely mildly symptomatic with subjective right lower extremity paresthesias.  Etiology of hyponatremia as yet unidentified.  He appears euvolemic.  No history of fluid loss.  Vitals stable.  Not on diuretics.  Will give a trial of IV fluids, need to rule out SIADH. -  500 mill bolus given, continue N/s 75 cc/hr  -Monitor sodium closely -Obtain urine sodium, urine osmolality, serum osmolality -Check TSH  Right lower extremity numbness-likely mildly symptomatic from hyponatremia.  He also reports a fall 1 week ago, did not hit head, but he is on chronic anticoagulation.  He also has a history of prostate cancer. -Obtain head CT for now.  Atrial fibrillation on chronic anticoagulant- heart rate 40s - 60s, on chronic anticoagulation with warfarin INR 1.8 (Coumadin had been held for 2 days due to hematomas sustained from a fall).  D-dimer checked in ED 1.29, he denies any  chest pains or respiratory symptoms. -Resume anticoagulation with warfarin, pharmacy to dose -Will hold metoprolol 25 mg daily for now with bradycardia  Hypertension-blood pressure 120s to 150s -Resume Norvasc, tamsulosin -Hold 25 mg metoprolol  Prostate cancer-follows with Dr. [place] Crane.  Last visit 02/24/2020.  Per care everywhere patient is on androgen deprivation therapy-leuprolide injections, every 6 monthly. - Resume tamsulosin   DVT prophylaxis: Coumadin Code  Status: Full code Family Communication: None at bedside Disposition Plan:  ~  2 days Consults called: None Admission status: Observation, telemetry   Bethena Roys MD [facility]  08/16/2020, 3:51 PM

## 2020-08-16 NOTE — ED Notes (Signed)
Some numbness at times in right after he fell yesterday. Pulse noted in right foot.

## 2020-08-16 NOTE — ED Notes (Signed)
Patient denies pain and is resting comfortably.  

## 2020-08-16 NOTE — ED Notes (Signed)
Date and time results received: 08/16/20 1326  Test: Sodium Critical Value: 119  Name of Provider Notified: Dr Gilford Raid  Orders Received? Or Actions Taken?: NA

## 2020-08-16 NOTE — ED Triage Notes (Signed)
Pt presents to ED with complaints of right lower leg pain. Pt states he is numb from his right ankle down his foot. Pt was able to stand in the bathroom prior to triage.

## 2020-08-16 NOTE — ED Provider Notes (Signed)
[facility] EMERGENCY DEPARTMENT Provider Note   CSN: [id number] Arrival date & time: 08/16/20  1104/01/14     History Chief Complaint  Patient presents with  . Leg Pain    Mark Wu is a 86 y.o. male.  Pt presents to the ED today with right leg numbness.  Pt fell a week ago and hit his left side.  He came to the ED and had xrays.  They were ok and he was sent home.  He is on coumadin and did not take his coumadin for 2 days b/c he had large hematomas.  He has not been moving much due to the contusions.  His right leg developed numbness today.  Pt denies any weakness.          Past Medical History:  Diagnosis Date  . Anal fissure 06/11/2009  . Atrial fibrillation ([place]) 06/11/2009  . HYPERTENSION 06/11/2009  . NECK MASS 10/01/2010  . Prostate cancer [facility])     Patient Active Problem List   Diagnosis Date Noted  . Exudative age-related macular degeneration of left eye with active choroidal neovascularization ([place]) 03/24/2020  . Secondary open-angle glaucoma, left, moderate stage 03/24/2020  . Retinal neovascularization of left eye 03/24/2020  . Exudative age-related macular degeneration of right eye with inactive choroidal neovascularization ([place]) 03/24/2020  . Long term (current) use of anticoagulants 07/10/2017  . Prostate cancer ([place]) 05/29/2017  . GERD (gastroesophageal reflux disease) 11/14/2016  . Encounter for therapeutic drug monitoring 02/23/2015  . Macular degeneration 08/08/2011  . Hearing loss 08/08/2011  . NECK MASS 10/01/2010  . Essential hypertension 06/11/2009  . ATRIAL FIBRILLATION 06/11/2009    Past Surgical History:  Procedure Laterality Date  . CARPAL TUNNEL RELEASE     Both hands  . CYST REMOVAL HAND     Left hand  . CYSTOSCOPY  2000   hand  . PROSTATE BIOPSY         Family History  Problem Relation Age of Onset  . Cancer Sister 77       unknown type  . Cancer Brother        prostate/seed implant with [name]/died 2014-01-13 of heart attack     Social History   Tobacco Use  . Smoking status: Former Smoker    Years: 50.00    Types: Cigars    Quit date: 08/08/1991    Years since quitting: 29.0  . Smokeless tobacco: Never Used  Vaping Use  . Vaping Use: Never used  Substance Use Topics  . Alcohol use: No  . Drug use: No    Home Medications Prior to Admission medications   Medication Sig Start Date End Date Taking? Authorizing Provider  amLODipine (NORVASC) 5 MG tablet TAKE 1 TABLET DAILY (NEED PHYSICAL) Patient taking differently: Take 5 mg by mouth daily.  03/20/20   Burchette, [name], MD  Calcium Carb-Cholecalciferol (CALCIUM CARBONATE-VITAMIN D3 PO) Take by mouth.    [provider]  clotrimazole-betamethasone (LOTRISONE) cream Apply topically as needed. 07/03/19   Burchette, [name], MD  latanoprost (XALATAN) 0.005 % ophthalmic solution INSTILL 1 DROP IN THE LEFT EYE NIGHTLY. (DISCARD 42 DAYS AFTER OPENING) Patient taking differently: Place 1 drop into the left eye at bedtime.  03/23/20   Rankin, Clent Demark, MD  metoprolol succinate (TOPROL-XL) 25 MG 24 hr tablet TAKE 1 TABLET DAILY 06/27/20   Burchette, [name], MD  tamsulosin (FLOMAX) 0.4 MG CAPS capsule Take 1 capsule (0.4 mg total) daily by mouth. 09/04/17   Burchette, Bruce  W, MD  warfarin (COUMADIN) 2.5 MG tablet TAKE 1 TABLET DAILY EXCEPT TAKE 2 TABLETS ON TUESDAY OR TAKE AS DIRECTED BY ANTICOAGULATION CLINIC 06/02/20   Burchette, [name], MD    Allergies    Patient has no known allergies.  Review of Systems   Review of Systems  Musculoskeletal:       Right foot numbness  All other systems reviewed and are negative.   Physical Exam Updated Vital Signs BP 138/69   Pulse 62   Temp 97.9 F (36.6 C) (Oral)   Resp 18   Ht 5\' 9"  (1.753 m)   Wt 78.5 kg   SpO2 100%   BMI 25.55 kg/m   Physical Exam Vitals and nursing note reviewed.  Constitutional:      Appearance: Normal appearance.  HENT:     Head: Normocephalic and atraumatic.     Right Ear:  External ear normal.     Left Ear: External ear normal.     Nose: Nose normal.     Mouth/Throat:     Mouth: Mucous membranes are moist.     Pharynx: Oropharynx is clear.  Eyes:     Extraocular Movements: Extraocular movements intact.     Conjunctiva/sclera: Conjunctivae normal.     Pupils: Pupils are equal, round, and reactive to light.  Cardiovascular:     Rate and Rhythm: Bradycardia present. Rhythm irregular.     Pulses: Normal pulses.     Heart sounds: Normal heart sounds.  Pulmonary:     Effort: Pulmonary effort is normal.     Breath sounds: Normal breath sounds.  Abdominal:     General: Abdomen is flat. Bowel sounds are normal.     Palpations: Abdomen is soft.  Musculoskeletal:       Arms:     Cervical back: Normal range of motion and neck supple.       Legs:  Skin:    General: Skin is warm.     Capillary Refill: Capillary refill takes less than 2 seconds.  Neurological:     General: No focal deficit present.     Mental Status: He is alert and oriented to person, place, and time.  Psychiatric:        Mood and Affect: Mood normal.        Behavior: Behavior normal.     ED Results / Procedures / Treatments   Labs (all labs ordered are listed, but only abnormal results are displayed) Labs Reviewed  D-DIMER, QUANTITATIVE (NOT AT [facility]) - Abnormal; Notable for the following components:      Result Value   D-Dimer, Quant 1.29 (*)    All other components within normal limits  BASIC METABOLIC PANEL - Abnormal; Notable for the following components:   Sodium 119 (*)    Chloride 89 (*)    Calcium 8.0 (*)    All other components within normal limits  CBC WITH DIFFERENTIAL/PLATELET - Abnormal; Notable for the following components:   RBC 3.68 (*)    Hemoglobin 10.4 (*)    HCT 32.4 (*)    RDW 17.2 (*)    Monocytes Absolute 1.3 (*)    All other components within normal limits  PROTIME-INR - Abnormal; Notable for the following components:   Prothrombin Time 20.0 (*)     INR 1.8 (*)    All other components within normal limits  URINALYSIS, ROUTINE W REFLEX MICROSCOPIC - Abnormal; Notable for the following components:   Color, Urine COLORLESS (*)  Specific Gravity, Urine 1.003 (*)    All other components within normal limits  RESPIRATORY PANEL BY RT PCR (FLU A&B, COVID)    EKG EKG Interpretation  Date/Time:  Sunday August 16 2020 14:53:07 EDT Ventricular Rate:  41 PR Interval:    QRS Duration: 127 QT Interval:  533 QTC Calculation: 446 R Axis:   -78 Text Interpretation: Atrial fibrillation Nonspecific IVCD with LAD Minimal ST elevation, anterior leads No significant change since last tracing Confirmed by Isla Pence [phone]) on 08/16/2020 3:26:08 PM   Radiology DG Chest 2 View  Result Date: 08/16/2020 CLINICAL DATA:  Fall EXAM: CHEST - 2 VIEW COMPARISON:  None. FINDINGS: Normal heart size. Calcified plaque the thoracic aorta. Minimal atelectasis/scarring at the left lung base. No pleural effusion or pneumothorax. No acute osseous abnormality. IMPRESSION: Minimal atelectasis/scarring at the left lung base. Electronically Signed   By: Macy Mis M.D.   On: 08/16/2020 14:46   DG Lumbar Spine Complete  Result Date: 08/16/2020 CLINICAL DATA:  Pain with right lower extremity numbness. EXAM: LUMBAR SPINE - COMPLETE 4+ VIEW COMPARISON:  CT abdomen pelvis dated 05/19/2017. FINDINGS: There is no evidence of lumbar spine fracture. Alignment is normal. There is moderate degenerative disc and severe degenerative joint disease in the lumbar spine. Moderate degenerative changes are seen in both hips. IMPRESSION: Negative for acute fracture. Moderate to severe degenerative changes in the lumbar spine. Electronically Signed   By: Zerita Boers M.D.   On: 08/16/2020 14:01   DG Tibia/Fibula Right  Result Date: 08/16/2020 CLINICAL DATA:  Right lower leg pain. EXAM: RIGHT TIBIA AND FIBULA - 2 VIEW COMPARISON:  None. FINDINGS: There is no evidence of fracture.  A sclerotic lesion in the right tibial metadiaphysis likely represents an old fibrous cortical lesion. Soft tissues are unremarkable. IMPRESSION: No acute osseous injury. Electronically Signed   By: Zerita Boers M.D.   On: 08/16/2020 14:06   DG Ankle Complete Right  Result Date: 08/16/2020 CLINICAL DATA:  Right lower leg pain. EXAM: RIGHT ANKLE - COMPLETE 3+ VIEW COMPARISON:  None. FINDINGS: There is no evidence of fracture, dislocation, or joint effusion. There is no evidence of arthropathy or other focal bone abnormality. Vascular calcifications noted. IMPRESSION: 1. No acute fracture or dislocation identified about the right ankle. 2. Vascular calcifications. Electronically Signed   By: Fidela Salisbury M.D.   On: 08/16/2020 13:51    Procedures Procedures (including critical care time)  Medications Ordered in ED Medications  sodium chloride 0.9 % bolus 500 mL (500 mLs Intravenous New Bag/Given 08/16/20 1453)    ED Course  I have reviewed the triage vital signs and the nursing notes.  Pertinent labs & imaging results that were available during my care of the patient were reviewed by me and considered in my medical decision making (see chart for details).    MDM Rules/Calculators/A&P                           CHA2DS2/VAS Stroke Risk Points  Current as of 14 minutes ago     3 >= 2 Points: High Risk  1 - 1.99 Points: Medium Risk  0 Points: Low Risk    Last Change: N/A      Details    This score determines the patient's risk of having a stroke if the  patient has atrial fibrillation.       Points Metrics  0 Has Congestive Heart Failure:  No  Current as of 14 minutes ago  0 Has Vascular Disease:  No    Current as of 14 minutes ago  1 Has Hypertension:  Yes    Current as of 14 minutes ago  2 Age:  86    Current as of 14 minutes ago  0 Has Diabetes:  No    Current as of 14 minutes ago  0 Had Stroke:  No  Had TIA:  No  Had thromboembolism:  No    Current as of 14  minutes ago  0 Male:  No    Current as of 14 minutes ago           Pt said he does not have a hx of hyponatremia.  He is not on any diuretics.    Last Na in Epic was from 2018 and it was nl.  Pt's ddimer is +, but he is on coumadin.  No [place] here today.  Pt d/w Dr. Denton Brick ([facility]) for admission.  Final Clinical Impression(s) / ED Diagnoses Final diagnoses:  Hyponatremia  Atrial fibrillation with slow ventricular response (HCC)  Anticoagulated on Coumadin  Leg numbness    Rx / DC Orders ED Discharge Orders    None       Isla Pence, MD 08/16/20 1550

## 2020-08-16 NOTE — Progress Notes (Signed)
ANTICOAGULATION CONSULT NOTE - Initial Up Consult   Pharmacy Consult for warfarin dosing  Indication: atrial fibrillation   No Known Allergies    Patient Measurements: Last Weight  Most recent update: 08/16/2020 12:26 PM   Weight  78.5 kg (173 lb)           Body mass index is 25.55 kg/m. Mark Wu               Temp: 97.8 F (36.6 C) (10/31 1840) Temp Source: Oral (10/31 1840) BP: 168/55 (10/31 1840) Pulse Rate: 45 (10/31 1840)  Labs: Recent Labs    08/16/20 1249  HGB 10.4*  HCT 32.4*  PLT 299  LABPROT 20.0*  INR 1.8*  CREATININE 0.82    Estimated Creatinine Clearance: 57.5 mL/min (by C-G formula based on SCr of 0.82 mg/dL).     Medications:  Medications Prior to Admission  Medication Sig Dispense Refill Last Dose  . amLODipine (NORVASC) 5 MG tablet TAKE 1 TABLET DAILY (NEED PHYSICAL) (Patient taking differently: Take 5 mg by mouth daily. ) 90 tablet 3 08/16/2020  . Calcium Carb-Cholecalciferol (CALCIUM CARBONATE-VITAMIN D3 PO) Take by mouth.   08/16/2020  . clotrimazole-betamethasone (LOTRISONE) cream Apply topically as needed. 45 g 1 08/16/2020  . latanoprost (XALATAN) 0.005 % ophthalmic solution INSTILL 1 DROP IN THE LEFT EYE NIGHTLY. (DISCARD 42 DAYS AFTER OPENING) (Patient taking differently: Place 1 drop into the left eye at bedtime. ) 5 mL 3 08/16/2020  . metoprolol succinate (TOPROL-XL) 25 MG 24 hr tablet TAKE 1 TABLET DAILY (Patient taking differently: Take 25 mg by mouth daily. ) 90 tablet 0 08/16/2020  . tamsulosin (FLOMAX) 0.4 MG CAPS capsule Take 1 capsule (0.4 mg total) daily by mouth. 90 capsule 1 08/16/2020  . warfarin (COUMADIN) 2.5 MG tablet TAKE 1 TABLET DAILY EXCEPT TAKE 2 TABLETS ON TUESDAY OR TAKE AS DIRECTED BY ANTICOAGULATION CLINIC 120 tablet 3 08/14/2020   Scheduled:  . warfarin  2.5 mg Oral Once  . [START ON 08/17/2020] Warfarin - Pharmacist Dosing Inpatient   Does not apply q1600   Infusions:  . sodium chloride 75 mL/hr at  08/16/20 1630   PRN:  Anti-infectives (From admission, onward)   None      Goal of Therapy:  INR 2-3 Monitor platelets by anticoagulation protocol: Yes    Prior to Admission Warfarin Dosing:  Mark Wu takes 2.5mg  of warfarin daily except tuesdays, takes 5mg       Admit INR was 1.8 Lab Results  Component Value Date   INR 1.8 (H) 08/16/2020   INR 2.2 (H) 08/09/2020   INR 2.5 07/27/2020    Assessment: Mark Wu a 86 y.o. male requires anticoagulation with warfarin for the indication of  atrial fibrillation. Warfarin will be initiated inpatient following pharmacy protocol per pharmacy consult. Patient most recent blood work is as follows: CBC Latest Ref Rng & Units 08/16/2020 04/04/2017 08/08/2011  WBC 4.0 - 10.5 K/uL 7.6 6.9 5.8  Hemoglobin 13.0 - 17.0 g/dL 10.4(L) 11.8(L) 12.6(L)  Hematocrit 39 - 52 % 32.4(L) 36.9(L) 38.4(L)  Platelets 150 - 400 K/uL 299 409.0(H) 237.0     Plan: Warfarin 2.5mg  po x 1 dose tonight Monitor CBC MWF with am labs   Monitor INR daily Monitor for signs and symptoms of bleeding   Donna Christen Mark Wu, PharmD, MBA, BCGP Clinical Pharmacist

## 2020-08-16 NOTE — ED Notes (Signed)
Pt going to CT

## 2020-08-17 ENCOUNTER — Observation Stay (HOSPITAL_COMMUNITY): Payer: Medicare Other

## 2020-08-17 DIAGNOSIS — Z7901 Long term (current) use of anticoagulants: Secondary | ICD-10-CM | POA: Diagnosis not present

## 2020-08-17 DIAGNOSIS — I4891 Unspecified atrial fibrillation: Secondary | ICD-10-CM

## 2020-08-17 DIAGNOSIS — Z79899 Other long term (current) drug therapy: Secondary | ICD-10-CM | POA: Diagnosis not present

## 2020-08-17 DIAGNOSIS — Z20822 Contact with and (suspected) exposure to covid-19: Secondary | ICD-10-CM | POA: Diagnosis not present

## 2020-08-17 DIAGNOSIS — R200 Anesthesia of skin: Secondary | ICD-10-CM | POA: Diagnosis not present

## 2020-08-17 DIAGNOSIS — E871 Hypo-osmolality and hyponatremia: Secondary | ICD-10-CM | POA: Diagnosis not present

## 2020-08-17 DIAGNOSIS — Z87891 Personal history of nicotine dependence: Secondary | ICD-10-CM | POA: Diagnosis not present

## 2020-08-17 DIAGNOSIS — G9389 Other specified disorders of brain: Secondary | ICD-10-CM | POA: Diagnosis not present

## 2020-08-17 DIAGNOSIS — I639 Cerebral infarction, unspecified: Secondary | ICD-10-CM | POA: Diagnosis not present

## 2020-08-17 DIAGNOSIS — I10 Essential (primary) hypertension: Secondary | ICD-10-CM

## 2020-08-17 DIAGNOSIS — R29818 Other symptoms and signs involving the nervous system: Secondary | ICD-10-CM | POA: Diagnosis not present

## 2020-08-17 DIAGNOSIS — Z8546 Personal history of malignant neoplasm of prostate: Secondary | ICD-10-CM | POA: Diagnosis not present

## 2020-08-17 LAB — BASIC METABOLIC PANEL
Anion gap: 6 (ref 5–15)
Anion gap: 7 (ref 5–15)
Anion gap: 8 (ref 5–15)
BUN: 11 mg/dL (ref 8–23)
BUN: 11 mg/dL (ref 8–23)
BUN: 11 mg/dL (ref 8–23)
CO2: 25 mmol/L (ref 22–32)
CO2: 25 mmol/L (ref 22–32)
CO2: 24 mmol/L (ref 22–32)
Calcium: 8.6 mg/dL — ABNORMAL LOW (ref 8.9–10.3)
Calcium: 8.6 mg/dL — ABNORMAL LOW (ref 8.9–10.3)
Calcium: 8.8 mg/dL — ABNORMAL LOW (ref 8.9–10.3)
Chloride: 105 mmol/L (ref 98–111)
Chloride: 103 mmol/L (ref 98–111)
Chloride: 103 mmol/L (ref 98–111)
Creatinine, Ser: 0.72 mg/dL (ref 0.61–1.24)
Creatinine, Ser: 0.73 mg/dL (ref 0.61–1.24)
Creatinine, Ser: 0.77 mg/dL (ref 0.61–1.24)
GFR, Estimated: >60 mL/min (ref >60)
GFR, Estimated: >60 mL/min (ref >60)
GFR, Estimated: >60 mL/min (ref >60)
Glucose, Bld: 93 mg/dL (ref 70–99)
Glucose, Bld: 89 mg/dL (ref 70–99)
Glucose, Bld: 115 mg/dL — ABNORMAL HIGH (ref 70–99)
Potassium: 4 mmol/L (ref 3.5–5.1)
Potassium: 4.4 mmol/L (ref 3.5–5.1)
Potassium: 3.8 mmol/L (ref 3.5–5.1)
Sodium: 136 mmol/L (ref 135–145)
Sodium: 135 mmol/L (ref 135–145)
Sodium: 135 mmol/L (ref 135–145)

## 2020-08-17 LAB — CBC
HCT: 31.8 % — ABNORMAL LOW (ref 39.0–52.0)
Hemoglobin: 10 g/dL — ABNORMAL LOW (ref 13.0–17.0)
MCH: 28.1 pg (ref 26.0–34.0)
MCHC: 31.4 g/dL (ref 30.0–36.0)
MCV: 89.3 fL (ref 80.0–100.0)
Platelets: 277 10*3/uL (ref 150–400)
RBC: 3.56 MIL/uL — ABNORMAL LOW (ref 4.22–5.81)
RDW: 17.6 % — ABNORMAL HIGH (ref 11.5–15.5)
WBC: 5.7 10*3/uL (ref 4.0–10.5)
nRBC: 0 % (ref 0.0–0.2)

## 2020-08-17 LAB — PROTIME-INR
INR: 2 — ABNORMAL HIGH (ref 0.8–1.2)
Prothrombin Time: 22.2 seconds — ABNORMAL HIGH (ref 11.4–15.2)

## 2020-08-17 MED ORDER — WARFARIN SODIUM 2.5 MG PO TABS
2.5000 mg | ORAL_TABLET | Freq: Once | ORAL | Status: AC
  Administered 2020-08-17: 2.5 mg via ORAL
  Filled 2020-08-17: qty 1

## 2020-08-17 NOTE — Evaluation (Signed)
Physical Therapy Evaluation Patient Details Name: Mark Wu MRN: [id number] DOB: [birth year] Today's Date: 08/17/2020   History of Present Illness  Mark Wu is a 86 y.o. male with medical history significant for atrial fibrillation, hypertension, prostate cancer, neck mass.Patient presented to the ED with complaints of right lower leg numbness involving his knee down to his foot, that started at about 10:00 today after he sat down for breakfast.  Patient reports he got up and he could not feel his right lower extremity, it felt numb.  He denies weakness or numbness involving any other extremity, no facial asymmetry or change in speech.  At the time of my evaluation, patient tells me he still has some numbness in his right lower extremity.    Clinical Impression  Patient demonstrates good return for sitting up at bedside, has to lean on nearby objects for support when completing sit to stands using SPC, required use of RW for safety with good return for ambulation in room and hallway without loss of balance.  Patient tolerated sitting up in chair after therapy.  Patient will benefit from continued physical therapy in hospital and recommended venue below to increase strength, balance, endurance for safe ADLs and gait.       Follow Up Recommendations Home health PT;Supervision for mobility/OOB;Supervision - Intermittent    Equipment Recommendations  None recommended by PT    Recommendations for Other Services       Precautions / Restrictions Precautions Precautions: Fall Restrictions Weight Bearing Restrictions: No      Mobility  Bed Mobility Overal bed mobility: Modified Independent                  Transfers Overall transfer level: Needs assistance Equipment used: Rolling walker (2 wheeled);Straight cane Transfers: Sit to/from [facility] Sit to Stand: Min guard Stand pivot transfers: Min guard       General transfer comment: has to lean on  nearby objects for support when attempting to use SPC, safer using RW  Ambulation/Gait Ambulation/Gait assistance: Supervision;Min guard Gait Distance (Feet): 65 Feet Assistive device: Rolling walker (2 wheeled) Gait Pattern/deviations: Decreased step length - right;Decreased step length - left;Decreased stride length Gait velocity: decreased   General Gait Details: slightly labored cadence without loss of balance, requires increased time to make turns using RW  Stairs            Wheelchair Mobility    Modified Rankin (Stroke Patients Only)       Balance Overall balance assessment: Needs assistance Sitting-balance support: Feet supported;No upper extremity supported Sitting balance-Leahy Scale: Good Sitting balance - Comments: seated at EOB   Standing balance support: During functional activity;Single extremity supported Standing balance-Leahy Scale: Poor Standing balance comment: fair using RW                             Pertinent Vitals/Pain Pain Assessment: No/denies pain    Home Living Family/patient expects to be discharged to:: Private residence Living Arrangements: Spouse/significant other Available Help at Discharge: Family;Available 24 hours/day Type of Home: House Home Access: Stairs to enter Entrance Stairs-Rails: Left Entrance Stairs-Number of Steps: 2 Home Layout: Two level Home Equipment: Walker - 2 wheels;Wheelchair - manual;Cane - single point      Prior Function Level of Independence: Independent with assistive device(s)         Comments: household and short distanced community ambulator using [place]  Extremity/Trunk Assessment   Upper Extremity Assessment Upper Extremity Assessment: Overall WFL for tasks assessed    Lower Extremity Assessment Lower Extremity Assessment: Generalized weakness    Cervical / Trunk Assessment Cervical / Trunk Assessment: Kyphotic  Communication   Communication:  HOH  Cognition Arousal/Alertness: Awake/alert Behavior During Therapy: WFL for tasks assessed/performed Overall Cognitive Status: Within Functional Limits for tasks assessed                                        General Comments      Exercises     Assessment/Plan    PT Assessment Patient needs continued PT services  PT Problem List Decreased strength;Decreased activity tolerance;Decreased balance;Decreased mobility       PT Treatment Interventions Balance training;Gait training;Stair training;DME instruction;Functional mobility training;Therapeutic activities;Therapeutic exercise;Patient/family education    PT Goals (Current goals can be found in the Care Plan section)  Acute Rehab PT Goals Patient Stated Goal: return home with family to assist PT Goal Formulation: With patient Time For Goal Achievement: 08/21/20 Potential to Achieve Goals: Good    Frequency Min 3X/week   Barriers to discharge        Co-evaluation               AM-PAC PT "6 Clicks" Mobility  Outcome Measure Help needed turning from your back to your side while in a flat bed without using bedrails?: None Help needed moving from lying on your back to sitting on the side of a flat bed without using bedrails?: None Help needed moving to and from a bed to a chair (including a wheelchair)?: A Little Help needed standing up from a chair using your arms (e.g., wheelchair or bedside chair)?: A Little Help needed to walk in hospital room?: A Little Help needed climbing 3-5 steps with a railing? : A Lot 6 Click Score: 19    End of Session   Activity Tolerance: Patient tolerated treatment well;Patient limited by fatigue Patient left: in chair;with call bell/phone within reach Nurse Communication: Mobility status PT Visit Diagnosis: Unsteadiness on feet (R26.81);Other abnormalities of gait and mobility (R26.89);Muscle weakness (generalized) (M62.81)    Time: 5400-8676 PT Time  Calculation (min) (ACUTE ONLY): 22 min   Charges:   PT Evaluation $PT Eval Moderate Complexity: 1 Mod PT Treatments $Therapeutic Activity: 8-22 mins        2:32 PM, 08/17/20 Lonell Grandchild, MPT Physical Therapist with [facility] [phone] office [phone] mobile phone

## 2020-08-17 NOTE — TOC Transition Note (Signed)
Transition of Care [facility]) - CM/SW Discharge Note  Patient Details  Name: Mark Wu MRN: [id number] Date of Birth: 12/26/1925  Transition of Care [facility] CM/SW Contact:  Iona Beard, [place] Phone Number: [phone] 08/17/2020, 6:17 PM   Clinical Narrative:    CSW updated Vaughan Basta with [facility] that pt is discharging and that [facility] orders are in. TOC signing off.      Barriers to Discharge: Continued Medical Work up   Patient Goals and CMS Choice Patient states their goals for this hospitalization and ongoing recovery are:: return home   Choice offered to / list presented to : Patient, Spouse  Discharge Placement    Discharge Plan and Services In-house Referral: Clinical Social Work   Post Acute Care Choice: [place]: PT [facility] Agency: [place] ([place]) Date [place]: 08/17/20 Time [place]: [place] Representative spoke with at [place]: [place] Determinants of Health ([place]) Interventions     Readmission Risk Interventions No flowsheet data found.

## 2020-08-17 NOTE — Progress Notes (Signed)
ANTICOAGULATION CONSULT NOTE -    Pharmacy Consult for warfarin dosing  Indication: atrial fibrillation   No Known Allergies    Patient Measurements: Last Weight  Most recent update: 08/16/2020 12:26 PM   Weight  78.5 kg (173 lb)           Body mass index is 25.55 kg/m. Stoney Bonura               Temp: 97.7 F (36.5 C) (11/01 0700) Temp Source: Oral (11/01 0432) BP: 139/52 (11/01 0700) Pulse Rate: 42 (11/01 0746)  Labs: Recent Labs    08/16/20 1249 08/17/20 0105 08/17/20 0414  HGB 10.4*  --  10.0*  HCT 32.4*  --  31.8*  PLT 299  --  277  LABPROT 20.0*  --  22.2*  INR 1.8*  --  2.0*  CREATININE 0.82 0.72 0.73    Estimated Creatinine Clearance: 58.9 mL/min (by C-G formula based on SCr of 0.73 mg/dL).     Medications:  Medications Prior to Admission  Medication Sig Dispense Refill Last Dose  . amLODipine (NORVASC) 5 MG tablet TAKE 1 TABLET DAILY (NEED PHYSICAL) (Patient taking differently: Take 5 mg by mouth daily. ) 90 tablet 3 08/16/2020  . Calcium Carb-Cholecalciferol (CALCIUM CARBONATE-VITAMIN D3 PO) Take by mouth.   08/16/2020  . clotrimazole-betamethasone (LOTRISONE) cream Apply topically as needed. 45 g 1 08/16/2020  . latanoprost (XALATAN) 0.005 % ophthalmic solution INSTILL 1 DROP IN THE LEFT EYE NIGHTLY. (DISCARD 42 DAYS AFTER OPENING) (Patient taking differently: Place 1 drop into the left eye at bedtime. ) 5 mL 3 08/16/2020  . metoprolol succinate (TOPROL-XL) 25 MG 24 hr tablet TAKE 1 TABLET DAILY (Patient taking differently: Take 25 mg by mouth daily. ) 90 tablet 0 08/16/2020  . tamsulosin (FLOMAX) 0.4 MG CAPS capsule Take 1 capsule (0.4 mg total) daily by mouth. 90 capsule 1 08/16/2020  . warfarin (COUMADIN) 2.5 MG tablet TAKE 1 TABLET DAILY EXCEPT TAKE 2 TABLETS ON TUESDAY OR TAKE AS DIRECTED BY ANTICOAGULATION CLINIC 120 tablet 3 08/14/2020   Scheduled:  . amLODipine  5 mg Oral Daily  . tamsulosin  0.4 mg Oral Daily  . Warfarin - Pharmacist Dosing  Inpatient   Does not apply q1600   Infusions:  . sodium chloride 75 mL/hr at 08/17/20 0553   PRN:  Anti-infectives (From admission, onward)   None      Goal of Therapy:  INR 2-3 Monitor platelets by anticoagulation protocol: Yes    Prior to Admission Warfarin Dosing:  Priyansh Pry takes 2.5mg  of warfarin daily except tuesdays, takes 5mg       Admit INR was 1.8 Lab Results  Component Value Date   INR 2.0 (H) 08/17/2020   INR 1.8 (H) 08/16/2020   INR 2.2 (H) 08/09/2020    Assessment: Viviano Bir a 86 y.o. male requires anticoagulation with warfarin for the indication of  atrial fibrillation. Warfarin will be initiated inpatient following pharmacy protocol per pharmacy consult. Patient most recent blood work is as follows: CBC Latest Ref Rng & Units 08/17/2020 08/16/2020 04/04/2017  WBC 4.0 - 10.5 K/uL 5.7 7.6 6.9  Hemoglobin 13.0 - 17.0 g/dL 10.0(L) 10.4(L) 11.8(L)  Hematocrit 39 - 52 % 31.8(L) 32.4(L) 36.9(L)  Platelets 150 - 400 K/uL 277 299 409.0(H)     Plan: Warfarin 2.5mg  po x 1 dose tonight Monitor CBC MWF with am labs   Monitor INR daily Monitor for signs and symptoms of bleeding   Margot Ables, PharmD Clinical Pharmacist  08/17/2020 8:25 AM

## 2020-08-17 NOTE — Care Management Obs Status (Signed)
[place] NOTIFICATION   Patient Details  Name: Mark Wu MRN: [id number] Date of Birth: 10/14/1926   Medicare Observation Status Notification Given:  Yes    Tommy Medal 08/17/2020, 3:13 PM

## 2020-08-17 NOTE — Progress Notes (Signed)
   08/17/20 0700  Assess: MEWS Score  Temp 97.7 F (36.5 C)  BP (!) 139/52  Pulse Rate (!) 38  Resp 18  Level of Consciousness Alert  SpO2 98 %  O2 Device Room Air  Assess: MEWS Score  MEWS Temp 0  MEWS Systolic 0  MEWS Pulse 2  MEWS RR 0  MEWS LOC 0  MEWS Score 2  MEWS Score Color Yellow  Treat  MEWS Interventions Escalated (See documentation below)  Pain Scale 0-10  Pain Score 0  Take Vital Signs  Increase Vital Sign Frequency  Yellow: Q 2hr X 2 then Q 4hr X 2, if remains yellow, continue Q 4hrs  Escalate  MEWS: Escalate Yellow: discuss with charge nurse/RN and consider discussing with provider and RRT  Notify: Charge Nurse/RN  Name of Charge Nurse/RN Notified lisa bullins  Date Charge Nurse/RN Notified 08/17/20  Time Charge Nurse/RN Notified 0710  Notify: Provider  Provider Name/Title Dr. Roderic Palau  Date Provider Notified 08/17/20  Time Provider Notified [phone]  Notification Type Page  Notification Reason Other (Comment) (to make aware of low heartrate)  Response See new orders  Date of Provider Response 08/17/20  Time of Provider Response [phone]

## 2020-08-17 NOTE — Plan of Care (Signed)
  Problem: Acute Rehab PT Goals(only PT should resolve) Goal: Pt Will Go Supine/Side To Sit Outcome: Progressing Flowsheets (Taken 08/17/2020 1434) Pt will go Supine/Side to Sit:  Independently  with modified independence Goal: Patient Will Transfer Sit To/From Stand Outcome: Progressing Flowsheets (Taken 08/17/2020 1434) Patient will transfer sit to/from stand:  with modified independence  with supervision Goal: Pt Will Transfer Bed To Chair/Chair To Bed Outcome: Progressing Flowsheets (Taken 08/17/2020 1434) Pt will Transfer Bed to Chair/Chair to Bed:  with modified independence  with supervision Goal: Pt Will Ambulate Outcome: Progressing Flowsheets (Taken 08/17/2020 1434) Pt will Ambulate:  100 feet  with supervision  with rolling walker   2:35 PM, 08/17/20 Lonell Grandchild, MPT Physical Therapist with [facility] [phone] office [phone] mobile phone

## 2020-08-17 NOTE — Discharge Summary (Signed)
Physician Discharge Summary  Mark Wu JXB:[id number] DOB: July 24, 1926 DOA: 08/16/2020  PCP: [name], MD  Admit date: 08/16/2020 Discharge date: 08/17/2020  Admitted From: Home Disposition: Home  Recommendations for Outpatient Follow-up:  1. Follow up with PCP in 1-2 weeks 2. Please obtain BMP/CBC in one week 3. Follow-up with Dr. Merlene Laughter, neurology in the next 2 weeks  Home Health: Home health PT Equipment/Devices:  Discharge Condition: Stable CODE STATUS: Full code Diet recommendation: Heart healthy  Brief/Interim Summary: HPI: Mark Wu is a 86 y.o. male with medical history significant for atrial fibrillation, hypertension, prostate cancer, neck mass. Patient presented to the ED with complaints of right lower leg numbness involving his knee down to his foot, that started at about 10:00 today after he sat down for breakfast.  Patient reports he got up and he could not feel his right lower extremity, it felt numb.  He denies weakness or numbness involving any other extremity, no facial asymmetry or change in speech.  At the time of my evaluation, patient tells me he still has some numbness in his right lower extremity. Patient denies vomiting, no loose stools.  He has maintained good oral intake.  He is not on diuretics. Patient denies chest pain, no difficulty breathing.  He has a mild chronic cough. Patient reports he fell, 10/24 for which was seen in the ED.  He fell when he was coming out of the bathroom, and his foot got hung up on a rug.  Did not hit his head.  He fell on his left buttock.  Imaging included left shoulder and left elbow x-ray, and a pelvic x-ray which was unremarkable. Patient had a large hematoma on his left buttock posteriorly where he had falling, so he was told not to take his Coumadin for 2 days. Patient denies frequent falls, he ambulates with a cane and has a wheelchair at home.  He has not fallen since 10/24.  ED Course: Heart rate 40s  to 60s, temperature 97.9.  Blood pressure 120s to 150s, O2 sat 100% on room air.  Sodium 119.  Potassium 4.4, stable creatinine and BUN.  INR 1.8.  D-dimer 1.29.  UA unremarkable.  Two-view chest x-ray shows left base scarring.  Number spine x-ray shows moderate to severe degenerative changes.  Right tibia/ fibula ankle x-ray unremarkable. 500 mill bolus given.  Hospitalist admit for hyponatremia.  Discharge Diagnoses:  Principal Problem:   Hyponatremia Active Problems:   Essential hypertension   ATRIAL FIBRILLATION   Prostate cancer ([place])   Long term (current) use of anticoagulants  Hyponatremia.  Initially presented with a sodium of 119.  Unclear etiology.  He received IV fluids and serum sodium has since corrected.  He is not having any further symptoms.  He is not on diuretics.  Would recommend rechecking sodium levels in 1 week to ensure stability  Right lower extremity numbness.  CT head negative.  MRI brain showed artifact, and no definitive signs of stroke.  Discussed with neurology, Dr. Merlene Laughter who felt it was appropriate to discharge patient have outpatient follow-up for further work-up  Atrial fibrillation.  Heart rate was in the 40s.  Will discontinue further beta-blockers.  He is anticoagulated with Coumadin.  Hypertension.  Blood pressure currently stable.  Continue on Norvasc.  Generalized weakness.  Seen by physical therapy with recommendations for home health therapy.  Discharge Instructions  Discharge Instructions    Diet - low sodium heart healthy   Complete by: As directed  Increase activity slowly   Complete by: As directed      Allergies as of 08/17/2020   No Known Allergies     Medication List    STOP taking these medications   metoprolol succinate 25 MG 24 hr tablet Commonly known as: TOPROL-XL     TAKE these medications   amLODipine 5 MG tablet Commonly known as: NORVASC TAKE 1 TABLET DAILY (NEED PHYSICAL) What changed: See the new  instructions.   CALCIUM CARBONATE-VITAMIN D3 PO Take by mouth.   clotrimazole-betamethasone cream Commonly known as: LOTRISONE Apply topically as needed.   latanoprost 0.005 % ophthalmic solution Commonly known as: XALATAN INSTILL 1 DROP IN THE LEFT EYE NIGHTLY. (DISCARD 42 DAYS AFTER OPENING) What changed: See the new instructions.   tamsulosin 0.4 MG Caps capsule Commonly known as: FLOMAX Take 1 capsule (0.4 mg total) daily by mouth.   warfarin 2.5 MG tablet Commonly known as: COUMADIN Take as directed. If you are unsure how to take this medication, talk to your nurse or doctor. Original instructions: TAKE 1 TABLET DAILY EXCEPT TAKE 2 TABLETS ON TUESDAY OR TAKE AS DIRECTED BY ANTICOAGULATION CLINIC       Follow-up Information    [facility].   Specialty: Home Health Services Why: Home health will contact you to arrange visits.        Phillips Odor, MD Follow up.   Specialty: Neurology Why: call for appointment in 2 weeks Contact information: [address] [place] [address] [phone]        [name], MD. Schedule an appointment as soon as possible for a visit in 2 week(s).   Specialty: Family Medicine Contact information: [place]  [phone]              No Known Allergies  Consultations:     Procedures/Studies: DG Chest 2 View  Result Date: 08/16/2020 CLINICAL DATA:  Fall EXAM: CHEST - 2 VIEW COMPARISON:  None. FINDINGS: Normal heart size. Calcified plaque the thoracic aorta. Minimal atelectasis/scarring at the left lung base. No pleural effusion or pneumothorax. No acute osseous abnormality. IMPRESSION: Minimal atelectasis/scarring at the left lung base. Electronically Signed   By: Macy Mis M.D.   On: 08/16/2020 14:46   DG Lumbar Spine Complete  Result Date: 08/16/2020 CLINICAL DATA:  Pain with right lower extremity numbness. EXAM: LUMBAR SPINE -  COMPLETE 4+ VIEW COMPARISON:  CT abdomen pelvis dated 05/19/2017. FINDINGS: There is no evidence of lumbar spine fracture. Alignment is normal. There is moderate degenerative disc and severe degenerative joint disease in the lumbar spine. Moderate degenerative changes are seen in both hips. IMPRESSION: Negative for acute fracture. Moderate to severe degenerative changes in the lumbar spine. Electronically Signed   By: Zerita Boers M.D.   On: 08/16/2020 14:01   DG Elbow Complete Left  Result Date: 08/09/2020 CLINICAL DATA:  Left elbow pain after fall today. EXAM: LEFT ELBOW - COMPLETE 3+ VIEW COMPARISON:  None. FINDINGS: There is no evidence of fracture, dislocation, or joint effusion. There is no evidence of arthropathy or other focal bone abnormality. Soft tissues are unremarkable. IMPRESSION: Negative. Electronically Signed   By: Marijo Conception M.D.   On: 08/09/2020 13:30   DG Tibia/Fibula Right  Result Date: 08/16/2020 CLINICAL DATA:  Right lower leg pain. EXAM: RIGHT TIBIA AND FIBULA - 2 VIEW COMPARISON:  None. FINDINGS: There is no evidence of fracture. A sclerotic lesion in the right tibial  metadiaphysis likely represents an old fibrous cortical lesion. Soft tissues are unremarkable. IMPRESSION: No acute osseous injury. Electronically Signed   By: Zerita Boers M.D.   On: 08/16/2020 14:06   DG Ankle Complete Right  Result Date: 08/16/2020 CLINICAL DATA:  Right lower leg pain. EXAM: RIGHT ANKLE - COMPLETE 3+ VIEW COMPARISON:  None. FINDINGS: There is no evidence of fracture, dislocation, or joint effusion. There is no evidence of arthropathy or other focal bone abnormality. Vascular calcifications noted. IMPRESSION: 1. No acute fracture or dislocation identified about the right ankle. 2. Vascular calcifications. Electronically Signed   By: Fidela Salisbury M.D.   On: 08/16/2020 13:51   CT HEAD WO CONTRAST  Result Date: 08/16/2020 CLINICAL DATA:  Right lower extremity numbness. Fall 1  week ago. On anticoagulation. EXAM: CT HEAD WITHOUT CONTRAST TECHNIQUE: Contiguous axial images were obtained from the base of the skull through the vertex without intravenous contrast. COMPARISON:  None. FINDINGS: Brain: Mild motion artifact limitations. No intracranial hemorrhage, mass effect, or midline shift. Age related atrophy with mild chronic small vessel ischemia. No hydrocephalus. The basilar cisterns are patent. No evidence of territorial infarct or acute ischemia. No extra-axial or intracranial fluid collection. Vascular: No hyperdense vessel or unexpected calcification. Skull: No fracture or focal lesion. Sinuses/Orbits: Occasional opacification of ethmoid air cells. The mastoid air cells are clear. The visualized orbits are unremarkable. Bilateral cataract resection. Other: None. IMPRESSION: 1. No acute intracranial abnormality. No skull fracture. 2. Age related atrophy and chronic small vessel ischemia. Electronically Signed   By: Keith Rake M.D.   On: 08/16/2020 18:20   MR BRAIN WO CONTRAST  Result Date: 08/17/2020 CLINICAL DATA:  Neuro deficit, acute, stroke suspected. Additional history provided: Left-sided weakness, pain since falling yesterday, history of prostate cancer and neck mass. EXAM: MRI HEAD WITHOUT CONTRAST TECHNIQUE: Multiplanar, multiecho pulse sequences of the brain and surrounding structures were obtained without intravenous contrast. COMPARISON:  Noncontrast head CT 08/16/2020. MRI of the face/neck 09/22/2010. FINDINGS: Brain: Mild-to-moderate generalized cerebral atrophy. Apparent punctate focus of diffusion weighted hyperintensity within the right pons appreciated on the axial diffusion-weighted sequence only (series 5, image 13). Mild multifocal T2/FLAIR hyperintensity within the cerebral white matter is nonspecific, but compatible with chronic small vessel ischemic disease. No evidence of intracranial mass. No chronic intracranial blood products. No extra-axial fluid  collection. No midline shift. Vascular: Expected proximal arterial flow voids. Skull and upper cervical spine: No focal marrow lesion. Ligamentous hypertrophy/pannus formation posterior to the dens. Upper cervical spondylosis. Sinuses/Orbits: Prior lens replacements. Visualized orbits show no acute finding. Mild ethmoid and maxillary sinus mucosal thickening. IMPRESSION: Apparent punctate focus of diffusion weighted hyperintensity within the right pons appreciated on the axial diffusion-weighted sequence only. This is favored to reflect image noise. However, a punctate acute/early subacute infarct cannot be definitively excluded. Otherwise, no evidence of acute intracranial abnormality. Mild cerebral white matter chronic small vessel ischemic disease. Mild/moderate generalized cerebral atrophy. Mild ethmoid and left maxillary sinus mucosal thickening. Electronically Signed   By: Kellie Simmering DO   On: 08/17/2020 17:25   DG Shoulder Left  Result Date: 08/09/2020 CLINICAL DATA:  Left shoulder pain after fall today. EXAM: LEFT SHOULDER - 2+ VIEW COMPARISON:  None. FINDINGS: There is no evidence of fracture or dislocation. Moderate degenerative changes seen involving the left acromioclavicular joint. Soft tissues are unremarkable. IMPRESSION: Moderate osteoarthritis of the left acromioclavicular joint. No acute abnormality seen in the left shoulder. Electronically Signed   By: Bobbe Medico.D.  On: 08/09/2020 13:28   DG Hip Unilat W or Wo Pelvis 2-3 Views Left  Result Date: 08/09/2020 CLINICAL DATA:  Acute LEFT hip pain following fall today. Initial encounter. EXAM: DG HIP (WITH OR WITHOUT PELVIS) 2-3V LEFT COMPARISON:  None. FINDINGS: There is no evidence of acute fracture or dislocation. Moderate to severe degenerative changes in both hips are noted. No suspicious focal bony lesions are present. IMPRESSION: 1. No evidence of acute abnormality. 2. Moderate to severe degenerative changes in both hips.  Electronically Signed   By: Margarette Canada M.D.   On: 08/09/2020 12:59       Subjective: Feeling better.  Feels that numbness in right foot has now resolved.  Wants to go home  Discharge Exam: Vitals:   08/16/20 1840 08/17/20 0432 08/17/20 0700 08/17/20 0746  BP: (!) 168/55 (!) 131/51 (!) 139/52   Pulse: (!) 45 (!) 37 (!) 38 (!) 42  Resp: 20 16 18    Temp: 97.8 F (36.6 C) 97.7 F (36.5 C) 97.7 F (36.5 C)   TempSrc: Oral Oral    SpO2: 100% 99% 98%   Weight:      Height:        General: Pt is alert, awake, not in acute distress Cardiovascular: RRR, S1/S2 +, no rubs, no gallops Respiratory: CTA bilaterally, no wheezing, no rhonchi Abdominal: Soft, NT, ND, bowel sounds + Extremities: no edema, no cyanosis    The results of significant diagnostics from this hospitalization (including imaging, microbiology, ancillary and laboratory) are listed below for reference.     Microbiology: Recent Results (from the past 240 hour(s))  Respiratory Panel by RT PCR (Flu A&B, Covid) - Nasopharyngeal Swab     Status: None   Collection Time: 08/16/20  1:51 PM   Specimen: Nasopharyngeal Swab  Result Value Ref Range Status   SARS Coronavirus 2 by RT PCR NEGATIVE NEGATIVE Final    Comment: (NOTE) SARS-CoV-2 target nucleic acids are NOT DETECTED.  The SARS-CoV-2 RNA is generally detectable in upper respiratoy specimens during the acute phase of infection. The lowest concentration of SARS-CoV-2 viral copies this assay can detect is 131 copies/mL. A negative result does not preclude SARS-Cov-2 infection and should not be used as the sole basis for treatment or other patient management decisions. A negative result may occur with  improper specimen collection/handling, submission of specimen other than nasopharyngeal swab, presence of viral mutation(s) within the areas targeted by this assay, and inadequate number of viral copies (<131 copies/mL). A negative result must be combined with  clinical observations, patient history, and epidemiological information. The expected result is Negative.  Fact Sheet for Patients:  PinkCheek.be  Fact Sheet for Healthcare Providers:  GravelBags.it  This test is no t yet approved or cleared by the [place] FDA and  has been authorized for detection and/or diagnosis of SARS-CoV-2 by FDA under an Emergency Use Authorization (EUA). This EUA will remain  in effect (meaning this test can be used) for the duration of the COVID-19 declaration under Section 564(b)(1) of the Act, 21 U.S.C. section 360bbb-3(b)(1), unless the authorization is terminated or revoked sooner.     Influenza A by PCR NEGATIVE NEGATIVE Final   Influenza B by PCR NEGATIVE NEGATIVE Final    Comment: (NOTE) The Xpert Xpress SARS-CoV-2/FLU/RSV assay is intended as an aid in  the diagnosis of influenza from Nasopharyngeal swab specimens and  should not be used as a sole basis for treatment. Nasal washings and  aspirates are unacceptable for Xpert  Xpress SARS-CoV-2/FLU/RSV  testing.  Fact Sheet for Patients: PinkCheek.be  Fact Sheet for Healthcare Providers: GravelBags.it  This test is not yet approved or cleared by the [place] FDA and  has been authorized for detection and/or diagnosis of SARS-CoV-2 by  FDA under an Emergency Use Authorization (EUA). This EUA will remain  in effect (meaning this test can be used) for the duration of the  Covid-19 declaration under Section 564(b)(1) of the Act, 21  U.S.C. section 360bbb-3(b)(1), unless the authorization is  terminated or revoked. Performed at [facility], [address] [place], [place] [address]      Labs: BNP (last 3 results) No results for input(s): BNP in the last 8760 hours. Basic Metabolic Panel: Recent Labs  Lab 08/16/20 1249 08/17/20 0105 08/17/20 0414 08/17/20 0914  NA  119* 136 135 135  K 4.4 4.0 4.4 3.8  CL 89* 105 103 103  CO2 22 25 25 24   GLUCOSE 94 93 89 115*  BUN 15 11 11 11   CREATININE 0.82 0.72 0.73 0.77  CALCIUM 8.0* 8.6* 8.6* 8.8*   Liver Function Tests: No results for input(s): AST, ALT, ALKPHOS, BILITOT, PROT, ALBUMIN in the last 168 hours. No results for input(s): LIPASE, AMYLASE in the last 168 hours. No results for input(s): AMMONIA in the last 168 hours. CBC: Recent Labs  Lab 08/16/20 1249 08/17/20 0414  WBC 7.6 5.7  NEUTROABS 4.9  --   HGB 10.4* 10.0*  HCT 32.4* 31.8*  MCV 88.0 89.3  PLT 299 277   Cardiac Enzymes: No results for input(s): CKTOTAL, CKMB, CKMBINDEX, TROPONINI in the last 168 hours. BNP: Invalid input(s): POCBNP CBG: No results for input(s): GLUCAP in the last 168 hours. D-Dimer Recent Labs    08/16/20 1249  DDIMER 1.29*   Hgb A1c No results for input(s): HGBA1C in the last 72 hours. Lipid Profile No results for input(s): CHOL, HDL, LDLCALC, TRIG, CHOLHDL, LDLDIRECT in the last 72 hours. Thyroid function studies Recent Labs    08/16/20 1622  TSH 1.018   Anemia work up No results for input(s): VITAMINB12, FOLATE, FERRITIN, TIBC, IRON, RETICCTPCT in the last 72 hours. Urinalysis    Component Value Date/Time   COLORURINE COLORLESS (A) 08/16/2020 1404   APPEARANCEUR CLEAR 08/16/2020 1404   LABSPEC 1.003 (L) 08/16/2020 1404   PHURINE 8.0 08/16/2020 1404   GLUCOSEU NEGATIVE 08/16/2020 1404   HGBUR NEGATIVE 08/16/2020 1404   BILIRUBINUR NEGATIVE 08/16/2020 1404   BILIRUBINUR neg 09/05/2013 1007   KETONESUR NEGATIVE 08/16/2020 1404   PROTEINUR NEGATIVE 08/16/2020 1404   UROBILINOGEN 0.2 09/05/2013 1007   NITRITE NEGATIVE 08/16/2020 1404   LEUKOCYTESUR NEGATIVE 08/16/2020 1404   Sepsis Labs Invalid input(s): PROCALCITONIN,  WBC,  LACTICIDVEN Microbiology Recent Results (from the past 240 hour(s))  Respiratory Panel by RT PCR (Flu A&B, Covid) - Nasopharyngeal Swab     Status: None    Collection Time: 08/16/20  1:51 PM   Specimen: Nasopharyngeal Swab  Result Value Ref Range Status   SARS Coronavirus 2 by RT PCR NEGATIVE NEGATIVE Final    Comment: (NOTE) SARS-CoV-2 target nucleic acids are NOT DETECTED.  The SARS-CoV-2 RNA is generally detectable in upper respiratoy specimens during the acute phase of infection. The lowest concentration of SARS-CoV-2 viral copies this assay can detect is 131 copies/mL. A negative result does not preclude SARS-Cov-2 infection and should not be used as the sole basis for treatment or other patient management decisions. A negative result may occur with  improper specimen  collection/handling, submission of specimen other than nasopharyngeal swab, presence of viral mutation(s) within the areas targeted by this assay, and inadequate number of viral copies (<131 copies/mL). A negative result must be combined with clinical observations, patient history, and epidemiological information. The expected result is Negative.  Fact Sheet for Patients:  PinkCheek.be  Fact Sheet for Healthcare Providers:  GravelBags.it  This test is no t yet approved or cleared by the [place] FDA and  has been authorized for detection and/or diagnosis of SARS-CoV-2 by FDA under an Emergency Use Authorization (EUA). This EUA will remain  in effect (meaning this test can be used) for the duration of the COVID-19 declaration under Section 564(b)(1) of the Act, 21 U.S.C. section 360bbb-3(b)(1), unless the authorization is terminated or revoked sooner.     Influenza A by PCR NEGATIVE NEGATIVE Final   Influenza B by PCR NEGATIVE NEGATIVE Final    Comment: (NOTE) The Xpert Xpress SARS-CoV-2/FLU/RSV assay is intended as an aid in  the diagnosis of influenza from Nasopharyngeal swab specimens and  should not be used as a sole basis for treatment. Nasal washings and  aspirates are unacceptable for Xpert  Xpress SARS-CoV-2/FLU/RSV  testing.  Fact Sheet for Patients: PinkCheek.be  Fact Sheet for Healthcare Providers: GravelBags.it  This test is not yet approved or cleared by the [place] FDA and  has been authorized for detection and/or diagnosis of SARS-CoV-2 by  FDA under an Emergency Use Authorization (EUA). This EUA will remain  in effect (meaning this test can be used) for the duration of the  Covid-19 declaration under Section 564(b)(1) of the Act, 21  U.S.C. section 360bbb-3(b)(1), unless the authorization is  terminated or revoked. Performed at [facility], [address], [place], [place] [address]      Time coordinating discharge: 29mins  SIGNED:   Kathie Dike, MD  [facility] 08/17/2020, 9:06 PM   If 7PM-7AM, please contact night-coverage [email or web address]

## 2020-08-17 NOTE — TOC Initial Note (Signed)
Transition of Care [facility]) - Initial/Assessment Note    Patient Details  Name: Mark Wu MRN: [id number] Date of Birth: 1925/12/07  Transition of Care [facility]) CM/SW Contact:    Salome Arnt, [place] Phone Number: 08/17/2020, 12:43 PM  Clinical Narrative:  Pt in observation due to hyponatremia. PT evaluated pt this morning and recommend home health. LCSW spoke with pt for some of assessment, but then pt was having a hard time hearing LCSW and requested that his wife be called. Pt was very independent prior to admission. He was still mowing his grass until last week when he had a fall. Pt has been using a cane since he fell, but also has a walker at home if needed. LCSW discussed home health services and provided option. They are agreeable to referral to [facility]. Referral made and accepted. TOC will continue to follow.                  Expected Discharge Plan: [place] Barriers to Discharge: Continued Medical Work up   Patient Goals and CMS Choice Patient states their goals for this hospitalization and ongoing recovery are:: return home   Choice offered to / list presented to : Patient, Spouse  Expected Discharge Plan and Services Expected Discharge Plan: [place] In-house Referral: Clinical Social Work   Post Acute Care Choice: [place] arrangements for the past 2 months: [place]: PT [place]: [place] ([place]) Date [place]: 08/17/20 Time [place]: [place] Representative spoke with at [place]: Vaughan Basta  Prior Living Arrangements/Services Living arrangements for the past 2 months: [place] Lives with:: Spouse Patient language and need for interpreter reviewed:: Yes Do you feel safe going back to the place where you live?: Yes      Need for Family Participation in Patient Care: Yes (Comment) Care giver support system in  place?: Yes (comment) Current home services: DME (cane, walker) Criminal Activity/Legal Involvement Pertinent to Current Situation/Hospitalization: No - Comment as needed  Activities of Daily Living Home Assistive Devices/Equipment: Dentures (specify type), Eyeglasses ADL Screening (condition at time of admission) Patient's cognitive ability adequate to safely complete daily activities?: Yes Is the patient deaf or have difficulty hearing?: Yes Does the patient have difficulty seeing, even when wearing glasses/contacts?: No Does the patient have difficulty concentrating, remembering, or making decisions?: No Patient able to express need for assistance with ADLs?: Yes Does the patient have difficulty dressing or bathing?: No Independently performs ADLs?: Yes (appropriate for developmental age) Does the patient have difficulty walking or climbing stairs?: Yes Weakness of Legs: Both Weakness of Arms/Hands: None  Permission Sought/Granted                  Emotional Assessment   Attitude/Demeanor/Rapport: Engaged Affect (typically observed): Accepting Orientation: : Oriented to Self, Oriented to Place, Oriented to  Time, Oriented to Situation Alcohol / Substance Use: Not Applicable Psych Involvement: No (comment)  Admission diagnosis:  Hyponatremia [E87.1] Leg numbness [R20.0] Atrial fibrillation with slow ventricular response (HCC) [I48.91] Anticoagulated on Coumadin [Z79.01] Patient Active Problem List   Diagnosis Date Noted  . Hyponatremia 08/16/2020  . Exudative age-related macular degeneration of left eye with active choroidal neovascularization ([place]) 03/24/2020  . Secondary open-angle glaucoma, left, moderate stage 03/24/2020  .  Retinal neovascularization of left eye 03/24/2020  . Exudative age-related macular degeneration of right eye with inactive choroidal neovascularization ([place]) 03/24/2020  . Long term (current) use of anticoagulants 07/10/2017  . Prostate cancer  ([place]) 05/29/2017  . GERD (gastroesophageal reflux disease) 11/14/2016  . Encounter for therapeutic drug monitoring 02/23/2015  . Macular degeneration 08/08/2011  . Hearing loss 08/08/2011  . NECK MASS 10/01/2010  . Essential hypertension 06/11/2009  . ATRIAL FIBRILLATION 06/11/2009   PCP:  [name], MD Pharmacy:   [place], [place] [address] [place] [address] Phone: [phone] Fax: [place], [place] [address] Phone: [phone] Fax: [place], [place] [address] [place] [address] Phone: [phone] Fax: [phone]     Social Determinants of Health (SDOH) Interventions    Readmission Risk Interventions No flowsheet data found.

## 2020-08-18 ENCOUNTER — Ambulatory Visit: Payer: TRICARE For Life (TFL) | Admitting: Family Medicine

## 2020-08-19 ENCOUNTER — Ambulatory Visit (INDEPENDENT_AMBULATORY_CARE_PROVIDER_SITE_OTHER): Payer: Medicare Other | Admitting: Family Medicine

## 2020-08-19 ENCOUNTER — Other Ambulatory Visit: Payer: Self-pay

## 2020-08-19 ENCOUNTER — Encounter: Payer: Self-pay | Admitting: Family Medicine

## 2020-08-19 VITALS — BP 142/80 | HR 57 | Temp 97.5°F | Ht 69.0 in | Wt 176.9 lb

## 2020-08-19 DIAGNOSIS — R221 Localized swelling, mass and lump, neck: Secondary | ICD-10-CM | POA: Diagnosis not present

## 2020-08-19 DIAGNOSIS — Z7901 Long term (current) use of anticoagulants: Secondary | ICD-10-CM | POA: Diagnosis not present

## 2020-08-19 DIAGNOSIS — C61 Malignant neoplasm of prostate: Secondary | ICD-10-CM | POA: Diagnosis not present

## 2020-08-19 DIAGNOSIS — R200 Anesthesia of skin: Secondary | ICD-10-CM | POA: Diagnosis not present

## 2020-08-19 DIAGNOSIS — M6281 Muscle weakness (generalized): Secondary | ICD-10-CM | POA: Diagnosis not present

## 2020-08-19 DIAGNOSIS — D649 Anemia, unspecified: Secondary | ICD-10-CM | POA: Diagnosis not present

## 2020-08-19 DIAGNOSIS — I4891 Unspecified atrial fibrillation: Secondary | ICD-10-CM | POA: Diagnosis not present

## 2020-08-19 DIAGNOSIS — E871 Hypo-osmolality and hyponatremia: Secondary | ICD-10-CM | POA: Diagnosis not present

## 2020-08-19 DIAGNOSIS — Z87891 Personal history of nicotine dependence: Secondary | ICD-10-CM | POA: Diagnosis not present

## 2020-08-19 DIAGNOSIS — Z9181 History of falling: Secondary | ICD-10-CM | POA: Diagnosis not present

## 2020-08-19 DIAGNOSIS — R202 Paresthesia of skin: Secondary | ICD-10-CM

## 2020-08-19 DIAGNOSIS — I10 Essential (primary) hypertension: Secondary | ICD-10-CM | POA: Diagnosis not present

## 2020-08-19 DIAGNOSIS — M47816 Spondylosis without myelopathy or radiculopathy, lumbar region: Secondary | ICD-10-CM | POA: Diagnosis not present

## 2020-08-19 DIAGNOSIS — W19XXXD Unspecified fall, subsequent encounter: Secondary | ICD-10-CM | POA: Diagnosis not present

## 2020-08-19 NOTE — Patient Instructions (Signed)
We will set up neurology follow up  Be sure to schedule labs on Nov 22 (same day you see Jenny Reichmann)  Use walker at all times.    Continue to hold the Metoprolol for now.

## 2020-08-19 NOTE — Progress Notes (Signed)
Established Patient Office Visit  Subjective:  Patient ID: Mark Wu, male    DOB: 04-01-1926  Age: 86 y.o. MRN: [id number]  CC:  Chief Complaint  Patient presents with  . Hospitalization Follow-up    having right leg pain , swelling in foot , told sodium     HPI Mark Wu presents for hospital follow-up.  He has past history of atrial fibrillation on chronic Coumadin therapy, hypertension, history of prostate cancer.  He presented to the ED on the 31st with acute right lower leg numbness and possible weakness.  His numbness was from the knee down to the foot and started around 10 AM after he sat down for breakfast.  He did not have any speech changes or any confusion or focal weakness in the upper extremities.  There had been recent history of fall on 10/24 after he was coming out of the bathroom and foot got hung on a rug.  No head injury.  Fell onto his left buttock.  Had x-rays at that time including left shoulder, left elbow, and pelvic x-ray which were unremarkable.  He had large hematoma left buttock and his Coumadin was held for 2 days.  He is generally very functional and very active and has had no other recent falls.  Ambulates with a cane at home.  Has been using a walker since hospital discharge.  Patient had heart rate down in the 40s to 60s and his metoprolol was held.  Admission sodium was 119.  No recent electrolytes for comparison.  No diuretic use.  No reported recent decreased oral intake.  D-dimer 1.29.  X-ray showed scarring left base.  Lumbar spine showed some degenerative changes lower spine.  He had x-rays of the right tibia and fibula which were unremarkable.  CT head unremarkable.  MRI brain showed some artifact but no definite evidence for stroke.  He received IV fluids for his hyponatremia and sodium the next day was 136.  He had a screen for SIADH which was negative.  He had normocytic anemia during hospitalization which apparently is chronic.  Still has  some persistent numbness right knee down to the foot but no definite weakness.  No urine or stool incontinence.  Past Medical History:  Diagnosis Date  . Anal fissure 06/11/2009  . Atrial fibrillation ([place]) 06/11/2009  . HYPERTENSION 06/11/2009  . NECK MASS 10/01/2010  . Prostate cancer [facility])     Past Surgical History:  Procedure Laterality Date  . CARPAL TUNNEL RELEASE     Both hands  . CYST REMOVAL HAND     Left hand  . CYSTOSCOPY  2000   hand  . PROSTATE BIOPSY      Family History  Problem Relation Age of Onset  . Cancer Sister 38       unknown type  . Cancer Brother        prostate/seed implant with [name]/died Jan 31, 2014 of heart attack    Social History   Socioeconomic History  . Marital status: Married    Spouse name: Not on file  . Number of children: Not on file  . Years of education: Not on file  . Highest education level: Not on file  Occupational History  . Occupation: retired    Comment: retired from [employer] in [place] Use  . Smoking status: Former Smoker    Years: 50.00    Types: Cigars    Quit date: 08/08/1991    Years since quitting: 29.0  . Smokeless tobacco:  Never Used  Vaping Use  . Vaping Use: Never used  Substance and Sexual Activity  . Alcohol use: No  . Drug use: No  . Sexual activity: Never  Other Topics Concern  . Not on file  Social History Narrative   Resides in [place] (near [place], [place])   Social Determinants of Health   Financial Resource Strain:   . Difficulty of Paying Living Expenses: Not on file  Food Insecurity:   . Worried About Charity fundraiser in the Last Year: Not on file  . Ran Out of Food in the Last Year: Not on file  Transportation Needs:   . Lack of Transportation (Medical): Not on file  . Lack of Transportation (Non-Medical): Not on file  Physical Activity:   . Days of Exercise per Week: Not on file  . Minutes of Exercise per Session: Not on file  Stress:   . Feeling of Stress : Not on file  Social  Connections:   . Frequency of Communication with Friends and Family: Not on file  . Frequency of Social Gatherings with Friends and Family: Not on file  . Attends Religious Services: Not on file  . Active Member of Clubs or Organizations: Not on file  . Attends Archivist Meetings: Not on file  . Marital Status: Not on file  Intimate Partner Violence:   . Fear of Current or Ex-Partner: Not on file  . Emotionally Abused: Not on file  . Physically Abused: Not on file  . Sexually Abused: Not on file    Outpatient Medications Prior to Visit  Medication Sig Dispense Refill  . amLODipine (NORVASC) 5 MG tablet TAKE 1 TABLET DAILY (NEED PHYSICAL) (Patient taking differently: Take 5 mg by mouth daily. ) 90 tablet 3  . Calcium Carb-Cholecalciferol (CALCIUM CARBONATE-VITAMIN D3 PO) Take by mouth.    . clotrimazole-betamethasone (LOTRISONE) cream Apply topically as needed. 45 g 1  . latanoprost (XALATAN) 0.005 % ophthalmic solution INSTILL 1 DROP IN THE LEFT EYE NIGHTLY. (DISCARD 42 DAYS AFTER OPENING) (Patient taking differently: Place 1 drop into the left eye at bedtime. ) 5 mL 3  . tamsulosin (FLOMAX) 0.4 MG CAPS capsule Take 1 capsule (0.4 mg total) daily by mouth. 90 capsule 1  . warfarin (COUMADIN) 2.5 MG tablet TAKE 1 TABLET DAILY EXCEPT TAKE 2 TABLETS ON TUESDAY OR TAKE AS DIRECTED BY ANTICOAGULATION CLINIC 120 tablet 3   No facility-administered medications prior to visit.    No Known Allergies  ROS Review of Systems  Constitutional: Negative for fatigue.  Eyes: Negative for visual disturbance.  Respiratory: Negative for cough, chest tightness and shortness of breath.   Cardiovascular: Negative for chest pain, palpitations and leg swelling.  Gastrointestinal: Negative for abdominal pain.  Genitourinary: Negative for dysuria.  Musculoskeletal: Negative for back pain.  Neurological: Positive for numbness. Negative for dizziness, syncope, weakness, light-headedness and  headaches.  Psychiatric/Behavioral: Negative for confusion.      Objective:    Physical Exam Vitals reviewed.  Constitutional:      Appearance: Normal appearance.  Cardiovascular:     Rate and Rhythm: Normal rate.  Pulmonary:     Effort: Pulmonary effort is normal.     Breath sounds: Normal breath sounds.  Musculoskeletal:     Right lower leg: No edema.     Left lower leg: No edema.  Neurological:     Mental Status: He is alert.     Comments: Cannot elicit reflexes knee bilaterally.  Trace ankle  reflex bilaterally.  He has fairly good strength with plantarflexion and dorsiflexion bilaterally.  Good strength with leg extension and hip flexion bilaterally  Impairment with monofilament testing involving the right foot and distal leg     BP (!) 142/80 (BP Location: Left Arm, Patient Position: Sitting, Cuff Size: Normal)   Pulse (!) 57   Temp (!) 97.5 F (36.4 C) (Oral)   Ht 5\' 9"  (1.753 m)   Wt 176 lb 14.4 oz (80.2 kg)   SpO2 99%   BMI 26.12 kg/m  Wt Readings from Last 3 Encounters:  08/19/20 176 lb 14.4 oz (80.2 kg)  08/16/20 173 lb (78.5 kg)  08/09/20 173 lb (78.5 kg)     Health Maintenance Due  Topic Date Due  . TETANUS/TDAP  Never done    There are no preventive care reminders to display for this patient.  Lab Results  Component Value Date   TSH 1.018 08/16/2020   Lab Results  Component Value Date   WBC 5.7 08/17/2020   HGB 10.0 (L) 08/17/2020   HCT 31.8 (L) 08/17/2020   MCV 89.3 08/17/2020   PLT 277 08/17/2020   Lab Results  Component Value Date   NA 135 08/17/2020   K 3.8 08/17/2020   CO2 24 08/17/2020   GLUCOSE 115 (H) 08/17/2020   BUN 11 08/17/2020   CREATININE 0.77 08/17/2020   BILITOT 0.9 04/04/2017   ALKPHOS 45 04/04/2017   AST 17 04/04/2017   ALT 12 04/04/2017   PROT 6.6 04/04/2017   ALBUMIN 4.1 04/04/2017   CALCIUM 8.8 (L) 08/17/2020   ANIONGAP 8 08/17/2020   GFR 78.45 04/04/2017   Lab Results  Component Value Date   CHOL 122  08/29/2012   Lab Results  Component Value Date   HDL 32.50 (L) 08/29/2012   Lab Results  Component Value Date   LDLCALC 76 08/29/2012   Lab Results  Component Value Date   TRIG 69.0 08/29/2012   Lab Results  Component Value Date   CHOLHDL 4 08/29/2012   No results found for: HGBA1C    Assessment & Plan:   #1 reported acute paresthesias involving right leg.  Not clear whether he had any associated weakness.  CNS imaging revealed no acute stroke.  He has some persistent numbness at this time right leg from the knee down to the foot.  No prior history of reported neuropathy. He has some degenerative changes lumbar spine and ? If related to that.  Not describing radiculitis pains.   -Recommend walker use at all times -Set up outpatient neurology referral for further evaluation  #2 hyponatremia.  Ruled out for SIADH.  No thiazide/diuretic use.  Given his recent good intake this was surprising.  Also, his sodium came back up rapidly to 136 with just some IV fluids  -Recheck basic metabolic panel when he comes back for his Coumadin check in a few weeks  #3 normocytic anemia with hemoglobin around 10  -Check repeat CBC, TIBC, serum iron, B12 at follow-up on the 22nd  #4 bradycardia-mild. -Continue to hold metoprolol at this time and monitor pulse and be in touch of this is coming back up in the 80s  No orders of the defined types were placed in this encounter.   Follow-up: No follow-ups on file.    [name], MD

## 2020-08-20 ENCOUNTER — Encounter: Payer: Self-pay | Admitting: Neurology

## 2020-08-21 DIAGNOSIS — I10 Essential (primary) hypertension: Secondary | ICD-10-CM | POA: Diagnosis not present

## 2020-08-21 DIAGNOSIS — I4891 Unspecified atrial fibrillation: Secondary | ICD-10-CM | POA: Diagnosis not present

## 2020-08-21 DIAGNOSIS — W19XXXD Unspecified fall, subsequent encounter: Secondary | ICD-10-CM | POA: Diagnosis not present

## 2020-08-21 DIAGNOSIS — E871 Hypo-osmolality and hyponatremia: Secondary | ICD-10-CM | POA: Diagnosis not present

## 2020-08-21 DIAGNOSIS — M47816 Spondylosis without myelopathy or radiculopathy, lumbar region: Secondary | ICD-10-CM | POA: Diagnosis not present

## 2020-08-21 DIAGNOSIS — M6281 Muscle weakness (generalized): Secondary | ICD-10-CM | POA: Diagnosis not present

## 2020-08-24 DIAGNOSIS — M6281 Muscle weakness (generalized): Secondary | ICD-10-CM | POA: Diagnosis not present

## 2020-08-24 DIAGNOSIS — W19XXXD Unspecified fall, subsequent encounter: Secondary | ICD-10-CM | POA: Diagnosis not present

## 2020-08-24 DIAGNOSIS — E871 Hypo-osmolality and hyponatremia: Secondary | ICD-10-CM | POA: Diagnosis not present

## 2020-08-24 DIAGNOSIS — I10 Essential (primary) hypertension: Secondary | ICD-10-CM | POA: Diagnosis not present

## 2020-08-24 DIAGNOSIS — M47816 Spondylosis without myelopathy or radiculopathy, lumbar region: Secondary | ICD-10-CM | POA: Diagnosis not present

## 2020-08-24 DIAGNOSIS — I4891 Unspecified atrial fibrillation: Secondary | ICD-10-CM | POA: Diagnosis not present

## 2020-08-25 ENCOUNTER — Encounter (INDEPENDENT_AMBULATORY_CARE_PROVIDER_SITE_OTHER): Payer: Self-pay | Admitting: Ophthalmology

## 2020-08-25 ENCOUNTER — Ambulatory Visit (INDEPENDENT_AMBULATORY_CARE_PROVIDER_SITE_OTHER): Payer: Medicare Other | Admitting: Ophthalmology

## 2020-08-25 ENCOUNTER — Other Ambulatory Visit: Payer: Self-pay

## 2020-08-25 DIAGNOSIS — H353221 Exudative age-related macular degeneration, left eye, with active choroidal neovascularization: Secondary | ICD-10-CM | POA: Diagnosis not present

## 2020-08-25 MED ORDER — BEVACIZUMAB CHEMO INJECTION 1.25MG/0.05ML SYRINGE FOR KALEIDOSCOPE
1.2500 mg | INTRAVITREAL | Status: AC | PRN
  Administered 2020-08-25: 1.25 mg via INTRAVITREAL

## 2020-08-25 NOTE — Assessment & Plan Note (Signed)
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

## 2020-08-25 NOTE — Progress Notes (Signed)
08/25/2020     CHIEF COMPLAINT Patient presents for Retina Follow Up   HISTORY OF PRESENT ILLNESS: Mark Wu is a 86 y.o. male who presents to the clinic today for:   HPI    Retina Follow Up    Patient presents with  Wet AMD.  In left eye.  This started 6 weeks ago.  Severity is mild.  Duration of 6 weeks.  Since onset it is stable.          Comments    6 Week AMD F/U OS, poss Avastin OS  Pt denies noticeable changes to [place] OU since last visit. Pt denies ocular pain, flashes of light, or floaters OU.         Last edited by Rockie Neighbours, [place] on 08/25/2020  9:57 AM. (History)      Referring physician: [name], MD [place],  [place] [address]  HISTORICAL INFORMATION:   Selected notes from the MEDICAL RECORD NUMBER       CURRENT MEDICATIONS: Current Outpatient Medications (Ophthalmic Drugs)  Medication Sig  . latanoprost (XALATAN) 0.005 % ophthalmic solution INSTILL 1 DROP IN THE LEFT EYE NIGHTLY. (DISCARD 42 DAYS AFTER OPENING) (Patient taking differently: Place 1 drop into the left eye at bedtime. )   No current facility-administered medications for this visit. (Ophthalmic Drugs)   Current Outpatient Medications (Other)  Medication Sig  . amLODipine (NORVASC) 5 MG tablet TAKE 1 TABLET DAILY (NEED PHYSICAL) (Patient taking differently: Take 5 mg by mouth daily. )  . Calcium Carb-Cholecalciferol (CALCIUM CARBONATE-VITAMIN D3 PO) Take by mouth.  . clotrimazole-betamethasone (LOTRISONE) cream Apply topically as needed.  . tamsulosin (FLOMAX) 0.4 MG CAPS capsule Take 1 capsule (0.4 mg total) daily by mouth.  . warfarin (COUMADIN) 2.5 MG tablet TAKE 1 TABLET DAILY EXCEPT TAKE 2 TABLETS ON TUESDAY OR TAKE AS DIRECTED BY ANTICOAGULATION CLINIC   No current facility-administered medications for this visit. (Other)      REVIEW OF SYSTEMS:    ALLERGIES No Known Allergies  PAST MEDICAL HISTORY Past Medical History:  Diagnosis Date    . Anal fissure 06/11/2009  . Atrial fibrillation ([place]) 06/11/2009  . HYPERTENSION 06/11/2009  . NECK MASS 10/01/2010  . Prostate cancer [facility])    Past Surgical History:  Procedure Laterality Date  . CARPAL TUNNEL RELEASE     Both hands  . CYST REMOVAL HAND     Left hand  . CYSTOSCOPY  2000   hand  . PROSTATE BIOPSY      FAMILY HISTORY Family History  Problem Relation Age of Onset  . Cancer Sister 10       unknown type  . Cancer Brother        prostate/seed implant with [name]/died 01-30-2014 of heart attack    SOCIAL HISTORY Social History   Tobacco Use  . Smoking status: Former Smoker    Years: 50.00    Types: Cigars    Quit date: 08/08/1991    Years since quitting: 29.0  . Smokeless tobacco: Never Used  Vaping Use  . Vaping Use: Never used  Substance Use Topics  . Alcohol use: No  . Drug use: No         OPHTHALMIC EXAM:  Base Eye Exam    Visual Acuity (ETDRS)      Right Left   Dist cc E card @ 3' 20/30   Dist ph cc NI NI   Correction: Glasses  Tonometry (Tonopen, 9:57 AM)      Right Left   Pressure 16 18       Pupils      Pupils Dark Light Shape React APD   Right PERRL 5 4 Round Brisk None   Left PERRL 5 4 Round Brisk None       Visual Fields (Counting fingers)      Left Right    Full Full       Extraocular Movement      Right Left    Full Full       Neuro/Psych    Oriented x3: Yes   Mood/Affect: Normal       Dilation    Left eye: 1.0% Mydriacyl, 2.5% Phenylephrine @ 10:00 AM        Slit Lamp and Fundus Exam    External Exam      Right Left   External Normal Normal       Slit Lamp Exam      Right Left   Lids/Lashes Normal Normal   Conjunctiva/Sclera White and quiet White and quiet   Cornea Clear Clear   Anterior Chamber Deep and quiet Deep and quiet   Iris Round and reactive Round and reactive   Lens Centered posterior chamber intraocular lens Centered posterior chamber intraocular lens   Anterior Vitreous Normal  Normal       Fundus Exam      Right Left   Posterior Vitreous  Posterior vitreous detachment, Central vitreous floaters   Disc  Peripapillary atrophy   C/D Ratio  0.15   Macula  Less Subretinal hemorrhage temporal to the fovea,,, Disciform scar with large mature choroidal vessels as the new blood supply to the macula, stable for years, Retinal pigment epithelial atrophy, Geographic atrophy   Vessels  Normal   Periphery  Normal          IMAGING AND PROCEDURES  Imaging and Procedures for 08/25/20  OCT, Retina - OU - Both Eyes       Right Eye Quality was good. Scan locations included subfoveal. Central Foveal Thickness: 340. Progression has been stable. Findings include abnormal foveal contour, subretinal hyper-reflective material, subretinal scarring.   Left Eye Quality was good. Scan locations included subfoveal. Central Foveal Thickness: 328. Progression has been stable. Findings include subretinal scarring, choroidal neovascular membrane, disciform scar, abnormal foveal contour, no SRF, no IRF.   Notes OS, stabilize lesion nasal to the fovea, chronically active and yet only stable at 47-month follow-up interval.  Repeat in Avastin OS today       Intravitreal Injection, Pharmacologic Agent - OS - Left Eye       Time Out 08/25/2020. 10:47 AM. Confirmed correct patient, procedure, site, and patient consented.   Anesthesia Topical anesthesia was used. Anesthetic medications included Akten 3.5%.   Procedure Preparation included Tobramycin 0.3%, 10% betadine to eyelids, 5% betadine to ocular surface, Ofloxacin . A 30 gauge needle was used.   Injection:  1.25 mg Bevacizumab (AVASTIN) SOLN   NDC: 70360-001-02, Lot: [id number]   Route: Intravitreal, Site: Left Eye, Waste: 0 mg  Post-op Post injection exam found visual acuity of at least counting fingers. The patient tolerated the procedure well. There were no complications. The patient received written and verbal post  procedure care education. Post injection medications were not given.                 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 Exudative age-related macular degeneration of left eye with active choroidal neovascularization (HCC)  H35.3221 OCT, Retina - OU - Both Eyes    Intravitreal Injection, Pharmacologic Agent - OS - Left Eye    Bevacizumab (AVASTIN) SOLN 1.25 mg    1.  Repeat intravitreal Avastin OS today to maintain visual functioning in this monocular patient.  Currently at 6-week interval.  We will repeat examination in 6 weeks  2. Dilate OU next visit 3.  Ophthalmic Meds Ordered this visit:  Meds ordered this encounter  Medications  . Bevacizumab (AVASTIN) SOLN 1.25 mg       Return in about 6 weeks (around 10/06/2020) for DILATE OU, AVASTIN OCT, OS, COLOR FP.  There are no Patient Instructions on file for this visit.   Explained the diagnoses, plan, and follow up with the patient and they expressed understanding.  Patient expressed understanding of the importance of proper follow up care.   Clent Demark Aldyn Toon M.D. Diseases & Surgery of the Retina and Vitreous Retina & Diabetic [place] 08/25/20     Abbreviations: M myopia (nearsighted); A astigmatism; H hyperopia (farsighted); P presbyopia; Mrx spectacle prescription;  CTL contact lenses; OD right eye; OS left eye; OU both eyes  XT exotropia; ET esotropia; PEK punctate epithelial keratitis; PEE punctate epithelial erosions; DES dry eye syndrome; MGD meibomian gland dysfunction; ATs artificial tears; PFAT's preservative free artificial tears; [place] nuclear sclerotic cataract; PSC posterior subcapsular cataract; ERM  epi-retinal membrane; PVD posterior vitreous detachment; RD retinal detachment; DM diabetes mellitus; DR diabetic retinopathy; NPDR non-proliferative diabetic retinopathy; PDR proliferative diabetic retinopathy; CSME clinically significant macular edema; DME diabetic macular edema; dbh dot blot hemorrhages; CWS cotton wool spot; POAG primary open angle glaucoma; C/D cup-to-disc ratio; HVF humphrey visual field; GVF goldmann visual field; OCT optical coherence tomography; IOP intraocular pressure; BRVO Branch retinal vein occlusion; CRVO central retinal vein occlusion; CRAO central retinal artery occlusion; BRAO branch retinal artery occlusion; RT retinal tear; SB scleral buckle; PPV pars plana vitrectomy; VH Vitreous hemorrhage; PRP panretinal laser photocoagulation; IVK intravitreal kenalog; VMT vitreomacular traction; MH Macular hole;  NVD neovascularization of the disc; NVE neovascularization elsewhere; AREDS age related eye disease study; ARMD age related macular degeneration; POAG primary open angle glaucoma; EBMD epithelial/anterior basement membrane dystrophy; ACIOL anterior chamber intraocular lens; IOL intraocular lens; PCIOL posterior chamber intraocular lens; Phaco/IOL phacoemulsification with intraocular lens placement; [place] photorefractive keratectomy; LASIK laser assisted in situ keratomileusis; HTN hypertension; DM diabetes mellitus; COPD chronic obstructive pulmonary disease

## 2020-08-28 DIAGNOSIS — W19XXXD Unspecified fall, subsequent encounter: Secondary | ICD-10-CM | POA: Diagnosis not present

## 2020-08-28 DIAGNOSIS — E871 Hypo-osmolality and hyponatremia: Secondary | ICD-10-CM | POA: Diagnosis not present

## 2020-08-28 DIAGNOSIS — I4891 Unspecified atrial fibrillation: Secondary | ICD-10-CM | POA: Diagnosis not present

## 2020-08-28 DIAGNOSIS — I10 Essential (primary) hypertension: Secondary | ICD-10-CM | POA: Diagnosis not present

## 2020-08-28 DIAGNOSIS — M6281 Muscle weakness (generalized): Secondary | ICD-10-CM | POA: Diagnosis not present

## 2020-08-28 DIAGNOSIS — M47816 Spondylosis without myelopathy or radiculopathy, lumbar region: Secondary | ICD-10-CM | POA: Diagnosis not present

## 2020-08-31 DIAGNOSIS — W19XXXD Unspecified fall, subsequent encounter: Secondary | ICD-10-CM | POA: Diagnosis not present

## 2020-08-31 DIAGNOSIS — I10 Essential (primary) hypertension: Secondary | ICD-10-CM | POA: Diagnosis not present

## 2020-08-31 DIAGNOSIS — I4891 Unspecified atrial fibrillation: Secondary | ICD-10-CM | POA: Diagnosis not present

## 2020-08-31 DIAGNOSIS — E871 Hypo-osmolality and hyponatremia: Secondary | ICD-10-CM | POA: Diagnosis not present

## 2020-08-31 DIAGNOSIS — M6281 Muscle weakness (generalized): Secondary | ICD-10-CM | POA: Diagnosis not present

## 2020-08-31 DIAGNOSIS — M47816 Spondylosis without myelopathy or radiculopathy, lumbar region: Secondary | ICD-10-CM | POA: Diagnosis not present

## 2020-09-03 DIAGNOSIS — M6281 Muscle weakness (generalized): Secondary | ICD-10-CM | POA: Diagnosis not present

## 2020-09-03 DIAGNOSIS — I10 Essential (primary) hypertension: Secondary | ICD-10-CM | POA: Diagnosis not present

## 2020-09-03 DIAGNOSIS — M47816 Spondylosis without myelopathy or radiculopathy, lumbar region: Secondary | ICD-10-CM | POA: Diagnosis not present

## 2020-09-03 DIAGNOSIS — E871 Hypo-osmolality and hyponatremia: Secondary | ICD-10-CM | POA: Diagnosis not present

## 2020-09-03 DIAGNOSIS — I4891 Unspecified atrial fibrillation: Secondary | ICD-10-CM | POA: Diagnosis not present

## 2020-09-03 DIAGNOSIS — W19XXXD Unspecified fall, subsequent encounter: Secondary | ICD-10-CM | POA: Diagnosis not present

## 2020-09-03 NOTE — Progress Notes (Signed)
NEUROLOGY CONSULTATION NOTE  Mark Wu MRN: [id number] DOB: September 13, 1926  Referring provider: [name], MD Primary care provider: [name], MD  Reason for consult:  Right lower extremity numbness   Subjective:  Mark Wu is a 86 year old right-handed male with atrial fibrillation, HTN, macular degeneration and history of prostate cancer who presents for right lower extremity numbness.  History supplemented by hospital and referring provider's note.  He is accompanied by his wife.   On 08/09/2020, he had fallen on his left buttock, sustaining a hematoma.  He was told to hold his Coumadin for 2 days.  On 08/16/2020, he stood up from the chair at breakfast and he couldn't feel his right leg.  His entire right leg from hip to foot, was numb and weak.  He denied associated pain or weakness and no involvement of the face, upper extremity or speech.  He denied incontinence.  Lumbar spine X-ray personally reviewed showed moderate to severe degenerative changes but no acute fracture.  CT of head personally reviewed was negative for acute abnormality.  Follow up MRI of brain personally reviewed showed a punctate diffusion weighted hyperintense focus was seen in the right pons but only on axial diffusion-weighted sequences, reflecting image noise or less likely acute/early subacute infarct.  She was found to have hyponatremia with Na of 119, which was treated.  Symptoms resolved before discharge the next day.  Event though symptoms resolved, he feels unsure of himself and has been relying on his walker to ambulate due to fear of falling.  PAST MEDICAL HISTORY: Past Medical History:  Diagnosis Date  . Anal fissure 06/11/2009  . Atrial fibrillation ([place]) 06/11/2009  . HYPERTENSION 06/11/2009  . NECK MASS 10/01/2010  . Prostate cancer ([facility])     PAST SURGICAL HISTORY: Past Surgical History:  Procedure Laterality Date  . CARPAL TUNNEL RELEASE     Both hands  . CYST REMOVAL HAND      Left hand  . CYSTOSCOPY  2000   hand  . PROSTATE BIOPSY      MEDICATIONS: Current Outpatient Medications on File Prior to Visit  Medication Sig Dispense Refill  . amLODipine (NORVASC) 5 MG tablet TAKE 1 TABLET DAILY (NEED PHYSICAL) (Patient taking differently: Take 5 mg by mouth daily. ) 90 tablet 3  . Calcium Carb-Cholecalciferol (CALCIUM CARBONATE-VITAMIN D3 PO) Take by mouth.    . clotrimazole-betamethasone (LOTRISONE) cream Apply topically as needed. 45 g 1  . latanoprost (XALATAN) 0.005 % ophthalmic solution INSTILL 1 DROP IN THE LEFT EYE NIGHTLY. (DISCARD 42 DAYS AFTER OPENING) (Patient taking differently: Place 1 drop into the left eye at bedtime. ) 5 mL 3  . tamsulosin (FLOMAX) 0.4 MG CAPS capsule Take 1 capsule (0.4 mg total) daily by mouth. 90 capsule 1  . warfarin (COUMADIN) 2.5 MG tablet TAKE 1 TABLET DAILY EXCEPT TAKE 2 TABLETS ON TUESDAY OR TAKE AS DIRECTED BY ANTICOAGULATION CLINIC 120 tablet 3   No current facility-administered medications on file prior to visit.    ALLERGIES: No Known Allergies  FAMILY HISTORY: Family History  Problem Relation Age of Onset  . Cancer Sister 41       unknown type  . Cancer Brother        prostate/seed implant with [name]/died 2014-01-28 of heart attack    SOCIAL HISTORY: Social History   Socioeconomic History  . Marital status: Married    Spouse name: Not on file  . Number of children: Not on file  . Years of  education: Not on file  . Highest education level: Not on file  Occupational History  . Occupation: retired    Comment: retired from [employer] in [place] Use  . Smoking status: Former Smoker    Years: 50.00    Types: Cigars    Quit date: 08/08/1991    Years since quitting: 29.0  . Smokeless tobacco: Never Used  Vaping Use  . Vaping Use: Never used  Substance and Sexual Activity  . Alcohol use: No  . Drug use: No  . Sexual activity: Never  Other Topics Concern  . Not on file  Social History Narrative    Resides in [place] (near [place], [place])   Social Determinants of Health   Financial Resource Strain:   . Difficulty of Paying Living Expenses: Not on file  Food Insecurity:   . Worried About Charity fundraiser in the Last Year: Not on file  . Ran Out of Food in the Last Year: Not on file  Transportation Needs:   . Lack of Transportation (Medical): Not on file  . Lack of Transportation (Non-Medical): Not on file  Physical Activity:   . Days of Exercise per Week: Not on file  . Minutes of Exercise per Session: Not on file  Stress:   . Feeling of Stress : Not on file  Social Connections:   . Frequency of Communication with Friends and Family: Not on file  . Frequency of Social Gatherings with Friends and Family: Not on file  . Attends Religious Services: Not on file  . Active Member of Clubs or Organizations: Not on file  . Attends Archivist Meetings: Not on file  . Marital Status: Not on file  Intimate Partner Violence:   . Fear of Current or Ex-Partner: Not on file  . Emotionally Abused: Not on file  . Physically Abused: Not on file  . Sexually Abused: Not on file    Objective:  Blood pressure (!) 176/76, pulse 67, resp. rate 18, height 5\' 9"  (1.753 m), weight 172 lb (78 kg), SpO2 99 %. General: No acute distress.  Patient appears well-groomed.   Head:  Normocephalic/atraumatic Eyes:  fundi examined but not visualized Neck: supple, no paraspinal tenderness, full range of motion Back: No paraspinal tenderness Heart: regular rate and rhythm Lungs: Clear to auscultation bilaterally. Vascular: No carotid bruits. Neurological Exam: Mental status: alert and oriented to person, place, and time, recent and remote memory intact, fund of knowledge intact, attention and concentration intact, speech fluent and not dysarthric, language intact. Cranial nerves: CN I: not tested CN II: pupils equal, round and reactive to light, decreased vision bilaterally CN III, IV, VI:  full  range of motion, no nystagmus, no ptosis CN V: facial sensation intact. CN VII: upper and lower face symmetric CN VIII: hearing intact CN IX, X: gag intact, uvula midline CN XI: sternocleidomastoid and trapezius muscles intact CN XII: tongue midline Bulk & Tone: normal, no fasciculations. Motor:  muscle strength 5/5 throughout Sensation:  Pinprick and vibratory sensation reduced in feet. Deep Tendon Reflexes:  1+ throughout,  toes downgoing.   Finger to nose testing:  Without dysmetria.   Heel to shin:  Without dysmetria.   Gait:  Wide-based gait with short strides.  Romberg with sway.  Assessment/Plan:   1.  Transient right leg numbness and weakness.  I have to suspect that he had a transient ischemic attack.  Symptoms involved the entire leg, not consistent with a lumbar radiculopathy. 2.  Atrial fibrillation 3.  Hypertension  1.  He is currently on anticoagulation 2.  Will check CTA of head and neck 3.  Will check lipid panel 4.  Follow up with PCP regarding blood pressure 5.  Follow up 4 to 6 months.    Thank you for allowing me to take part in the care of this patient.  Metta Clines, DO  CC: [name], MD

## 2020-09-04 ENCOUNTER — Other Ambulatory Visit (INDEPENDENT_AMBULATORY_CARE_PROVIDER_SITE_OTHER): Payer: Medicare Other

## 2020-09-04 ENCOUNTER — Encounter: Payer: Self-pay | Admitting: Neurology

## 2020-09-04 ENCOUNTER — Ambulatory Visit (INDEPENDENT_AMBULATORY_CARE_PROVIDER_SITE_OTHER): Payer: Medicare Other | Admitting: Neurology

## 2020-09-04 ENCOUNTER — Other Ambulatory Visit: Payer: Self-pay

## 2020-09-04 VITALS — BP 176/76 | HR 67 | Resp 18 | Ht 69.0 in | Wt 172.0 lb

## 2020-09-04 DIAGNOSIS — G459 Transient cerebral ischemic attack, unspecified: Secondary | ICD-10-CM

## 2020-09-04 DIAGNOSIS — I4891 Unspecified atrial fibrillation: Secondary | ICD-10-CM

## 2020-09-04 DIAGNOSIS — I10 Essential (primary) hypertension: Secondary | ICD-10-CM | POA: Diagnosis not present

## 2020-09-04 LAB — LIPID PANEL
Cholesterol: 142 mg/dL (ref 0–200)
HDL: 36.1 mg/dL — ABNORMAL LOW (ref >39.00)
LDL Cholesterol: 83 mg/dL (ref 0–99)
NonHDL: 105.57
Total CHOL/HDL Ratio: 4
Triglycerides: 114 mg/dL (ref 0.0–149.0)
VLDL: 22.8 mg/dL (ref 0.0–40.0)

## 2020-09-04 NOTE — Patient Instructions (Addendum)
At this point, I would treat your event as a transient ischemic attack (or "mini stroke"). 1.  I would like to check CTA of head and neck. We have sent a referral to [facility] for your MRI and they will call you directly to schedule your appointment. They are located at [address]. If you need to contact them directly please call [phone].   2.  I would like to check a lipid panel 3.  Further recommendations pending results 4.  Follow up in 4 to 6 months.

## 2020-09-07 ENCOUNTER — Ambulatory Visit (INDEPENDENT_AMBULATORY_CARE_PROVIDER_SITE_OTHER): Payer: Medicare Other | Admitting: General Practice

## 2020-09-07 ENCOUNTER — Other Ambulatory Visit: Payer: Medicare Other

## 2020-09-07 ENCOUNTER — Other Ambulatory Visit: Payer: Self-pay

## 2020-09-07 DIAGNOSIS — I4891 Unspecified atrial fibrillation: Secondary | ICD-10-CM | POA: Diagnosis not present

## 2020-09-07 DIAGNOSIS — R202 Paresthesia of skin: Secondary | ICD-10-CM

## 2020-09-07 DIAGNOSIS — D649 Anemia, unspecified: Secondary | ICD-10-CM

## 2020-09-07 DIAGNOSIS — E871 Hypo-osmolality and hyponatremia: Secondary | ICD-10-CM | POA: Diagnosis not present

## 2020-09-07 DIAGNOSIS — Z7901 Long term (current) use of anticoagulants: Secondary | ICD-10-CM

## 2020-09-07 LAB — POCT INR: INR: 2.9 (ref 2.0–3.0)

## 2020-09-07 NOTE — Patient Instructions (Addendum)
Pre visit review using our clinic review tool, if applicable. No additional management support is needed unless otherwise documented below in the visit note.  Continue to take 1 tablet daily except 2 tablets every Monday. Re-check in 5 to 6 weeks.

## 2020-09-07 NOTE — Progress Notes (Signed)
Tried calling pt, No answer. Unable to LVM 

## 2020-09-08 DIAGNOSIS — M6281 Muscle weakness (generalized): Secondary | ICD-10-CM | POA: Diagnosis not present

## 2020-09-08 DIAGNOSIS — E871 Hypo-osmolality and hyponatremia: Secondary | ICD-10-CM | POA: Diagnosis not present

## 2020-09-08 DIAGNOSIS — I4891 Unspecified atrial fibrillation: Secondary | ICD-10-CM | POA: Diagnosis not present

## 2020-09-08 DIAGNOSIS — W19XXXD Unspecified fall, subsequent encounter: Secondary | ICD-10-CM | POA: Diagnosis not present

## 2020-09-08 DIAGNOSIS — I10 Essential (primary) hypertension: Secondary | ICD-10-CM | POA: Diagnosis not present

## 2020-09-08 DIAGNOSIS — M47816 Spondylosis without myelopathy or radiculopathy, lumbar region: Secondary | ICD-10-CM | POA: Diagnosis not present

## 2020-09-08 LAB — BASIC METABOLIC PANEL
BUN: 12 mg/dL (ref 7–25)
CO2: 25 mmol/L (ref 20–32)
Calcium: 10.1 mg/dL (ref 8.6–10.3)
Chloride: 106 mmol/L (ref 98–110)
Creat: 0.9 mg/dL (ref 0.70–1.11)
Glucose, Bld: 95 mg/dL (ref 65–99)
Potassium: 5.2 mmol/L (ref 3.5–5.3)
Sodium: 143 mmol/L (ref 135–146)

## 2020-09-08 LAB — CBC WITH DIFFERENTIAL/PLATELET
Absolute Monocytes: 734 cells/uL (ref 200–950)
Basophils Absolute: 22 cells/uL (ref 0–200)
Basophils Relative: 0.4 %
Eosinophils Absolute: 140 cells/uL (ref 15–500)
Eosinophils Relative: 2.5 %
HCT: 36.9 % — ABNORMAL LOW (ref 38.5–50.0)
Hemoglobin: 13.3 g/dL (ref 13.2–17.1)
Lymphs Abs: 1232 cells/uL (ref 850–3900)
MCH: 32.4 pg (ref 27.0–33.0)
MCHC: 36 g/dL (ref 32.0–36.0)
MCV: 90 fL (ref 80.0–100.0)
MPV: 10 fL (ref 7.5–12.5)
Monocytes Relative: 13.1 %
Neutro Abs: 3472 cells/uL (ref 1500–7800)
Neutrophils Relative %: 62 %
Platelets: 288 10*3/uL (ref 140–400)
RBC: 4.1 10*6/uL — ABNORMAL LOW (ref 4.20–5.80)
RDW: 15.7 % — ABNORMAL HIGH (ref 11.0–15.0)
Total Lymphocyte: 22 %
WBC: 5.6 10*3/uL (ref 3.8–10.8)

## 2020-09-08 LAB — IRON, TOTAL/TOTAL IRON BINDING CAP
%SAT: 10 % (calc) — ABNORMAL LOW (ref 20–48)
Iron: 39 ug/dL — ABNORMAL LOW (ref 50–180)
TIBC: 378 mcg/dL (calc) (ref 250–425)

## 2020-09-08 LAB — VITAMIN B12: Vitamin B-12: 478 pg/mL (ref 200–1100)

## 2020-09-09 ENCOUNTER — Telehealth: Payer: Self-pay | Admitting: Family Medicine

## 2020-09-09 NOTE — Telephone Encounter (Signed)
Reviewed lab results with pt, agrees for appointment to discuss lab results. Scheduled for 09/15/2020

## 2020-09-09 NOTE — Telephone Encounter (Signed)
Patient is returning the call, please advise. CB is [phone]

## 2020-09-14 DIAGNOSIS — W19XXXD Unspecified fall, subsequent encounter: Secondary | ICD-10-CM | POA: Diagnosis not present

## 2020-09-14 DIAGNOSIS — I10 Essential (primary) hypertension: Secondary | ICD-10-CM | POA: Diagnosis not present

## 2020-09-14 DIAGNOSIS — M47816 Spondylosis without myelopathy or radiculopathy, lumbar region: Secondary | ICD-10-CM | POA: Diagnosis not present

## 2020-09-14 DIAGNOSIS — E871 Hypo-osmolality and hyponatremia: Secondary | ICD-10-CM | POA: Diagnosis not present

## 2020-09-14 DIAGNOSIS — I4891 Unspecified atrial fibrillation: Secondary | ICD-10-CM | POA: Diagnosis not present

## 2020-09-14 DIAGNOSIS — M6281 Muscle weakness (generalized): Secondary | ICD-10-CM | POA: Diagnosis not present

## 2020-09-15 ENCOUNTER — Other Ambulatory Visit: Payer: Self-pay

## 2020-09-15 ENCOUNTER — Encounter: Payer: Self-pay | Admitting: Family Medicine

## 2020-09-15 ENCOUNTER — Ambulatory Visit (INDEPENDENT_AMBULATORY_CARE_PROVIDER_SITE_OTHER): Payer: Medicare Other | Admitting: Family Medicine

## 2020-09-15 VITALS — BP 145/68 | HR 70 | Ht 69.0 in | Wt 173.0 lb

## 2020-09-15 DIAGNOSIS — D509 Iron deficiency anemia, unspecified: Secondary | ICD-10-CM

## 2020-09-15 NOTE — Patient Instructions (Signed)

## 2020-09-15 NOTE — Progress Notes (Signed)
Established Patient Office Visit  Subjective:  Patient ID: Mark Wu, male    DOB: 1926/07/03  Age: 86 y.o. MRN: [id number]  CC:  Chief Complaint  Patient presents with  . Abnormal Lab    HPI Mark Wu presents for follow-up regarding recent abnormal lab.  He had CBC back in October with hemoglobin 10.4 with normal MCV.  Follow-up CBC on 08/17/2020 was 10.0.  We had him follow-up for labs on the 01/26/2023 of the month.  B12 was normal.  CBC revealed hemoglobin back up to 13.3.  Iron saturation was low at 10% and serum iron low at 39 with normal TIBC.  Patient's last colonoscopy was apparently probably 20 years ago if not longer.  He has not had any recent bloody stools.  He has actually had some recent weight gain.  Appetite stable.  Does have occasional straining with constipation but no new stool changes.  No dizziness.  No abdominal pain.  He does have chronic atrial fibrillation and is on chronic Coumadin.  INRs have been stable.  Most recent INR was 2.9  8 days ago  Past Medical History:  Diagnosis Date  . Anal fissure 06/11/2009  . Atrial fibrillation ([facility] [facility]) 06/11/2009  . HYPERTENSION 06/11/2009  . NECK MASS 10/01/2010  . Prostate cancer [facility])     Past Surgical History:  Procedure Laterality Date  . CARPAL TUNNEL RELEASE     Both hands  . CYST REMOVAL HAND     Left hand  . CYSTOSCOPY  2000   hand  . PROSTATE BIOPSY      Family History  Problem Relation Age of Onset  . Cancer Sister 56       unknown type  . Cancer Brother        prostate/seed implant with [name]/died 01/25/2014 of heart attack    Social History   Socioeconomic History  . Marital status: Married    Spouse name: Not on file  . Number of children: Not on file  . Years of education: Not on file  . Highest education level: Not on file  Occupational History  . Occupation: retired    Comment: retired from [employer] in [place] Use  . Smoking status: Former Smoker    Years: 50.00    Types: Cigars     Quit date: 08/08/1991    Years since quitting: 29.1  . Smokeless tobacco: Never Used  Vaping Use  . Vaping Use: Never used  Substance and Sexual Activity  . Alcohol use: No  . Drug use: No  . Sexual activity: Never  Other Topics Concern  . Not on file  Social History Narrative   Resides in [place] (near [place], [place])   One story home   Occasionally caffeine   Right handed   Social Determinants of Health   Financial Resource Strain:   . Difficulty of Paying Living Expenses: Not on file  Food Insecurity:   . Worried About Charity fundraiser in the Last Year: Not on file  . Ran Out of Food in the Last Year: Not on file  Transportation Needs:   . Lack of Transportation (Medical): Not on file  . Lack of Transportation (Non-Medical): Not on file  Physical Activity:   . Days of Exercise per Week: Not on file  . Minutes of Exercise per Session: Not on file  Stress:   . Feeling of Stress : Not on file  Social Connections:   . Frequency of Communication with Friends  and Family: Not on file  . Frequency of Social Gatherings with Friends and Family: Not on file  . Attends Religious Services: Not on file  . Active Member of Clubs or Organizations: Not on file  . Attends Archivist Meetings: Not on file  . Marital Status: Not on file  Intimate Partner Violence:   . Fear of Current or Ex-Partner: Not on file  . Emotionally Abused: Not on file  . Physically Abused: Not on file  . Sexually Abused: Not on file    Outpatient Medications Prior to Visit  Medication Sig Dispense Refill  . amLODipine (NORVASC) 5 MG tablet TAKE 1 TABLET DAILY (NEED PHYSICAL) (Patient taking differently: Take 5 mg by mouth daily. ) 90 tablet 3  . Calcium Carb-Cholecalciferol (CALCIUM CARBONATE-VITAMIN D3 PO) Take by mouth.    . clotrimazole-betamethasone (LOTRISONE) cream Apply topically as needed. 45 g 1  . latanoprost (XALATAN) 0.005 % ophthalmic solution INSTILL 1 DROP IN THE LEFT EYE NIGHTLY.  (DISCARD 42 DAYS AFTER OPENING) (Patient taking differently: Place 1 drop into the left eye at bedtime. ) 5 mL 3  . tamsulosin (FLOMAX) 0.4 MG CAPS capsule Take 1 capsule (0.4 mg total) daily by mouth. 90 capsule 1  . warfarin (COUMADIN) 2.5 MG tablet TAKE 1 TABLET DAILY EXCEPT TAKE 2 TABLETS ON TUESDAY OR TAKE AS DIRECTED BY ANTICOAGULATION CLINIC 120 tablet 3   No facility-administered medications prior to visit.    No Known Allergies  ROS Review of Systems  Constitutional: Negative for appetite change, chills, fever and unexpected weight change.  Respiratory: Negative for shortness of breath.   Gastrointestinal: Negative for abdominal pain, blood in stool, diarrhea, nausea and vomiting.  Neurological: Negative for dizziness.      Objective:    Physical Exam Vitals reviewed.  Constitutional:      Appearance: Normal appearance.  Cardiovascular:     Rate and Rhythm: Normal rate.  Pulmonary:     Effort: Pulmonary effort is normal.     Breath sounds: Normal breath sounds.  Genitourinary:    Comments: Digital exam reveals no rectal masses.  Minimal brown stool which was Hemoccult negative Neurological:     Mental Status: He is alert.     BP (!) 145/68 (BP Location: Left Arm, Cuff Size: Normal)   Pulse 70   Ht 5\' 9"  (1.753 m)   Wt 173 lb (78.5 kg)   BMI 25.55 kg/m  Wt Readings from Last 3 Encounters:  09/15/20 173 lb (78.5 kg)  09/04/20 172 lb (78 kg)  08/19/20 176 lb 14.4 oz (80.2 kg)     Health Maintenance Due  Topic Date Due  . TETANUS/TDAP  Never done    There are no preventive care reminders to display for this patient.  Lab Results  Component Value Date   TSH 1.018 08/16/2020   Lab Results  Component Value Date   WBC 5.6 09/07/2020   HGB 13.3 09/07/2020   HCT 36.9 (L) 09/07/2020   MCV 90.0 09/07/2020   PLT 288 09/07/2020   Lab Results  Component Value Date   NA 143 09/07/2020   K 5.2 09/07/2020   CO2 25 09/07/2020   GLUCOSE 95 09/07/2020    BUN 12 09/07/2020   CREATININE 0.90 09/07/2020   BILITOT 0.9 04/04/2017   ALKPHOS 45 04/04/2017   AST 17 04/04/2017   ALT 12 04/04/2017   PROT 6.6 04/04/2017   ALBUMIN 4.1 04/04/2017   CALCIUM 10.1 09/07/2020   ANIONGAP 8 08/17/2020  GFR 78.45 04/04/2017   Lab Results  Component Value Date   CHOL 142 09/04/2020   Lab Results  Component Value Date   HDL 36.10 (L) 09/04/2020   Lab Results  Component Value Date   LDLCALC 83 09/04/2020   Lab Results  Component Value Date   TRIG 114.0 09/04/2020   Lab Results  Component Value Date   CHOLHDL 4 09/04/2020   No results found for: HGBA1C    Assessment & Plan:   Recent normocytic anemia with low serum iron.  Most recent hemoglobin is improved at 13.3.  Hemoccult negative on exam today.  We discussed possible sources of low iron including GI losses such as AVMs which can be intermittent.  Iron malabsorption would seem unlikely as his hemoglobin  recently increased from 10 to 13 in a short time period.  -We discussed the option of further evaluation but at age 32 and his other comorbidities he declines colonoscopy at this time which seems reasonable especially in setting of Hemoccult negative stool and no unexplained weight loss -We discussed iron rich diet. -Reassess in 2 months and recheck CBC and iron indices at that time unless he notices any blood in his stool or other concerns  No orders of the defined types were placed in this encounter.   Follow-up: Return in about 2 months (around 11/15/2020).    [name], MD

## 2020-09-23 DIAGNOSIS — C61 Malignant neoplasm of prostate: Secondary | ICD-10-CM | POA: Diagnosis not present

## 2020-09-25 ENCOUNTER — Ambulatory Visit
Admission: RE | Admit: 2020-09-25 | Discharge: 2020-09-25 | Disposition: A | Discharge status: Home / Self Care | Payer: Medicare Other | Source: Ambulatory Visit | Attending: Neurology | Admitting: Neurology

## 2020-09-25 DIAGNOSIS — I10 Essential (primary) hypertension: Secondary | ICD-10-CM

## 2020-09-25 DIAGNOSIS — G459 Transient cerebral ischemic attack, unspecified: Secondary | ICD-10-CM

## 2020-09-25 DIAGNOSIS — I6621 Occlusion and stenosis of right posterior cerebral artery: Secondary | ICD-10-CM | POA: Diagnosis not present

## 2020-09-25 DIAGNOSIS — I4891 Unspecified atrial fibrillation: Secondary | ICD-10-CM

## 2020-09-25 MED ORDER — IOPAMIDOL (ISOVUE-370) INJECTION 76%
75.0000 mL | Freq: Once | INTRAVENOUS | Status: AC | PRN
  Administered 2020-09-25: 75 mL via INTRAVENOUS

## 2020-09-28 ENCOUNTER — Telehealth: Payer: Self-pay

## 2020-09-28 NOTE — Telephone Encounter (Signed)
Pt advised of his Ct results

## 2020-10-06 ENCOUNTER — Encounter (INDEPENDENT_AMBULATORY_CARE_PROVIDER_SITE_OTHER): Payer: Self-pay | Admitting: Ophthalmology

## 2020-10-06 ENCOUNTER — Ambulatory Visit (INDEPENDENT_AMBULATORY_CARE_PROVIDER_SITE_OTHER): Payer: Medicare Other | Admitting: Ophthalmology

## 2020-10-06 ENCOUNTER — Other Ambulatory Visit: Payer: Self-pay

## 2020-10-06 DIAGNOSIS — H353221 Exudative age-related macular degeneration, left eye, with active choroidal neovascularization: Secondary | ICD-10-CM

## 2020-10-06 DIAGNOSIS — H353212 Exudative age-related macular degeneration, right eye, with inactive choroidal neovascularization: Secondary | ICD-10-CM | POA: Diagnosis not present

## 2020-10-06 MED ORDER — BEVACIZUMAB 2.5 MG/0.1ML IZ SOSY
2.5000 mg | PREFILLED_SYRINGE | INTRAVITREAL | Status: AC | PRN
  Administered 2020-10-06: 11:00:00 2.5 mg via INTRAVITREAL

## 2020-10-06 NOTE — Assessment & Plan Note (Signed)
No active signs of the center lesion

## 2020-10-06 NOTE — Progress Notes (Signed)
10/06/2020     CHIEF COMPLAINT Patient presents for Retina Follow Up (6 Week AMD F/U OU, poss Avastin OS//Pt denies noticeable changes to [place] OU since last visit. Pt denies ocular pain, flashes of light, or floaters OU. //)   HISTORY OF PRESENT ILLNESS: Mark Wu is a 86 y.o. male who presents to the clinic today for:   HPI    Retina Follow Up    Patient presents with  Wet AMD.  In left eye.  This started 6 weeks ago.  Severity is mild.  Duration of 6 weeks.  Since onset it is stable. Additional comments: 6 Week AMD F/U OU, poss Avastin OS  Pt denies noticeable changes to [place] OU since last visit. Pt denies ocular pain, flashes of light, or floaters OU.          Last edited by Rockie Neighbours, [place] on 10/06/2020 10:09 AM. (History)      Referring physician: [name], MD [place],  [place] [address]  HISTORICAL INFORMATION:   Selected notes from the MEDICAL RECORD NUMBER       CURRENT MEDICATIONS: Current Outpatient Medications (Ophthalmic Drugs)  Medication Sig   latanoprost (XALATAN) 0.005 % ophthalmic solution INSTILL 1 DROP IN THE LEFT EYE NIGHTLY. (DISCARD 42 DAYS AFTER OPENING) (Patient taking differently: Place 1 drop into the left eye at bedtime. )   No current facility-administered medications for this visit. (Ophthalmic Drugs)   Current Outpatient Medications (Other)  Medication Sig   amLODipine (NORVASC) 5 MG tablet TAKE 1 TABLET DAILY (NEED PHYSICAL) (Patient taking differently: Take 5 mg by mouth daily. )   Calcium Carb-Cholecalciferol (CALCIUM CARBONATE-VITAMIN D3 PO) Take by mouth.   clotrimazole-betamethasone (LOTRISONE) cream Apply topically as needed.   tamsulosin (FLOMAX) 0.4 MG CAPS capsule Take 1 capsule (0.4 mg total) daily by mouth.   warfarin (COUMADIN) 2.5 MG tablet TAKE 1 TABLET DAILY EXCEPT TAKE 2 TABLETS ON TUESDAY OR TAKE AS DIRECTED BY ANTICOAGULATION CLINIC   No current facility-administered medications  for this visit. (Other)      REVIEW OF SYSTEMS:    ALLERGIES No Known Allergies  PAST MEDICAL HISTORY Past Medical History:  Diagnosis Date   Anal fissure 06/11/2009   Atrial fibrillation ([place]) 06/11/2009   HYPERTENSION 06/11/2009   NECK MASS 10/01/2010   Prostate cancer ([place])    Past Surgical History:  Procedure Laterality Date   CARPAL TUNNEL RELEASE     Both hands   CYST REMOVAL HAND     Left hand   CYSTOSCOPY  2000   hand   PROSTATE BIOPSY      FAMILY HISTORY Family History  Problem Relation Age of Onset   Cancer Sister 63       unknown type   Cancer Brother        prostate/seed implant with [name]/died January 29, 2014 of heart attack    SOCIAL HISTORY Social History   Tobacco Use   Smoking status: Former Smoker    Years: 50.00    Types: Cigars    Quit date: 08/08/1991    Years since quitting: 29.1   Smokeless tobacco: Never Used  Vaping Use   Vaping Use: Never used  Substance Use Topics   Alcohol use: No   Drug use: No         OPHTHALMIC EXAM:  Base Eye Exam    Visual Acuity (ETDRS)      Right Left   Dist cc E card @ 3' 20/40 -2  Dist ph cc NI 20/30 -2   Correction: Glasses       Tonometry (Tonopen, 10:09 AM)      Right Left   Pressure 15 18       Pupils      Dark Light Shape React APD   Right 5 4 Round Brisk None   Left 5 4 Round Brisk +1       Visual Fields (Counting fingers)      Left Right    Full Full       Extraocular Movement      Right Left    Full Full       Neuro/Psych    Oriented x3: Yes   Mood/Affect: Normal       Dilation    Both eyes: 1.0% Mydriacyl, 2.5% Phenylephrine @ 10:13 AM        Slit Lamp and Fundus Exam    External Exam      Right Left   External Normal Normal       Slit Lamp Exam      Right Left   Lids/Lashes Normal Normal   Conjunctiva/Sclera White and quiet White and quiet   Cornea Clear Clear   Anterior Chamber Deep and quiet Deep and quiet   Iris Round and reactive  Round and reactive   Lens Centered posterior chamber intraocular lens Centered posterior chamber intraocular lens   Anterior Vitreous Normal Normal       Fundus Exam      Right Left   Posterior Vitreous Posterior vitreous detachment Posterior vitreous detachment, Central vitreous floaters   Disc Normal Peripapillary atrophy   C/D Ratio 0.2 0.15   Macula Hard drusen, Geographic atrophy, Disciform scar Less yet persistent inferiorly subretinal hemorrhage temporal to the fovea,,, Disciform scar with large mature choroidal vessels as the new blood supply to the macula, stable for years, Retinal pigment epithelial atrophy, Geographic atrophy   Vessels Normal Normal   Periphery Normal Normal          IMAGING AND PROCEDURES  Imaging and Procedures for 10/06/20  OCT, Retina - OU - Both Eyes       Right Eye Quality was good. Scan locations included subfoveal. Central Foveal Thickness: 344. Progression has been stable. Findings include abnormal foveal contour, subretinal hyper-reflective material, subretinal scarring.   Left Eye Quality was good. Scan locations included subfoveal. Central Foveal Thickness: 402. Progression has been stable. Findings include subretinal scarring, choroidal neovascular membrane, disciform scar, abnormal foveal contour, no SRF, no IRF.   Notes OS, stabilize lesion nasal to the fovea, chronically active and yet only stable at 37-month follow-up interval.  Repeat in Avastin OS today  OD with massive permanent disciform fibrotic scar, stable overall       Color Fundus Photography Optos - OU - Both Eyes       Right Eye Progression has been stable. Disc findings include normal observations. Macula : geographic atrophy.   Left Eye Progression has been stable. Disc findings include normal observations. Macula : retinal pigment epithelium abnormalities, geographic atrophy.   Notes Subfoveal disciform scar, geographic atrophy OD, accounts for acuity, no  active CNVM OD,  OS geographic macular atrophy, mature large trunk of a CNVM into the foveal region with subfoveal fibrosis yet preserved good acuity, stable overall         Intravitreal Injection, Pharmacologic Agent - OS - Left Eye       Time Out 10/06/2020. 11:12 AM. Confirmed correct patient, procedure, site,  and patient consented.   Anesthesia Topical anesthesia was used. Anesthetic medications included [place] 3.5%.   Procedure Preparation included Tobramycin 0.3%, 10% betadine to eyelids, 5% betadine to ocular surface, Ofloxacin . A 30 gauge needle was used.   Injection:  2.5 mg Bevacizumab (AVASTIN) 2.5mg /0.102mL SOSY   NDC: 66294-765-46, Lot: [id number]   Route: Intravitreal, Site: Left Eye  Post-op Post injection exam found visual acuity of at least counting fingers. The patient tolerated the procedure well. There were no complications. The patient received written and verbal post procedure care education. Post injection medications were not given.                 ASSESSMENT/PLAN:  Exudative age-related macular degeneration of left eye with active choroidal neovascularization (HCC) The nature of wet macular degeneration was discussed with the patient.  Forms of therapy reviewed include the use of Anti-VEGF medications injected painlessly into the eye, as well as other possible treatment modalities, including thermal laser therapy. Fellow eye involvement and risks were discussed with the patient. Upon the finding of wet age related macular degeneration, treatment will be offered. The treatment regimen is on a treat as needed basis with the intent to treat if necessary and extend interval of exams when possible. On average 1 out of 6 patients do not need lifetime therapy. However, the risk of recurrent disease is high for a lifetime.  Initially monthly, then periodic, examinations and evaluations will determine whether the next treatment is required on the day of the  examination.  Monocular patient left eye with preserved subfoveal fibrosis and yet good acuity from large mature CNVM which has become the new outer retinal blood supply in the fovea.  Intraretinal hemorrhage inferiorly remains, will repeat injection Avastin today to maintain function  Exudative age-related macular degeneration of right eye with inactive choroidal neovascularization (HCC) No active signs of the center lesion      ICD-10-CM   1. Exudative age-related macular degeneration of left eye with active choroidal neovascularization (HCC)  H35.3221 OCT, Retina - OU - Both Eyes    Color Fundus Photography Optos - OU - Both Eyes    Intravitreal Injection, Pharmacologic Agent - OS - Left Eye    bevacizumab (AVASTIN) SOSY 2.5 mg  2. Exudative age-related macular degeneration of right eye with inactive choroidal neovascularization ([place])  H35.3212     1.  Chronic yet impressively stable CNVM subfoveal OS on long-term intravitreal therapy, and inferiorly, subretinal hemorrhage persists yet stable currently at 6-week follow-up interval.  Repeat injection today and examination again in 6 weeks  2.  OD inactive observe  3.  Ophthalmic Meds Ordered this visit:  Meds ordered this encounter  Medications   bevacizumab (AVASTIN) SOSY 2.5 mg       Return in about 6 weeks (around 11/17/2020) for dilate, OS, AVASTIN OCT.  There are no Patient Instructions on file for this visit.   Explained the diagnoses, plan, and follow up with the patient and they expressed understanding.  Patient expressed understanding of the importance of proper follow up care.   Clent Demark Heavin Sebree M.D. Diseases & Surgery of the Retina and Vitreous [facility]ina [facility] [place] 10/06/20     Abbreviations: M myopia (nearsighted); A astigmatism; H hyperopia (farsighted); P presbyopia; Mrx spectacle prescription;  CTL contact lenses; OD right eye; OS left eye; OU both eyes  XT exotropia; ET esotropia; PEK punctate  epithelial keratitis; PEE punctate epithelial erosions; DES dry eye syndrome; MGD meibomian gland dysfunction; ATs  artificial tears; PFAT's preservative free artificial tears; [place] nuclear sclerotic cataract; PSC posterior subcapsular cataract; ERM epi-retinal membrane; PVD posterior vitreous detachment; RD retinal detachment; DM diabetes mellitus; DR diabetic retinopathy; NPDR non-proliferative diabetic retinopathy; PDR proliferative diabetic retinopathy; CSME clinically significant macular edema; DME diabetic macular edema; dbh dot blot hemorrhages; CWS cotton wool spot; POAG primary open angle glaucoma; C/D cup-to-disc ratio; HVF humphrey visual field; GVF goldmann visual field; OCT optical coherence tomography; IOP intraocular pressure; BRVO Branch retinal vein occlusion; CRVO central retinal vein occlusion; CRAO central retinal artery occlusion; BRAO branch retinal artery occlusion; RT retinal tear; SB scleral buckle; PPV pars plana vitrectomy; VH Vitreous hemorrhage; PRP panretinal laser photocoagulation; IVK intravitreal kenalog; VMT vitreomacular traction; MH Macular hole;  NVD neovascularization of the disc; NVE neovascularization elsewhere; AREDS age related eye disease study; ARMD age related macular degeneration; POAG primary open angle glaucoma; EBMD epithelial/anterior basement membrane dystrophy; ACIOL anterior chamber intraocular lens; IOL intraocular lens; PCIOL posterior chamber intraocular lens; Phaco/IOL phacoemulsification with intraocular lens placement; [place] photorefractive keratectomy; LASIK laser assisted in situ keratomileusis; HTN hypertension; DM diabetes mellitus; COPD chronic obstructive pulmonary disease

## 2020-10-06 NOTE — Assessment & Plan Note (Addendum)
The nature of wet macular degeneration was discussed with the patient.  Forms of therapy reviewed include the use of Anti-VEGF medications injected painlessly into the eye, as well as other possible treatment modalities, including thermal laser therapy. Fellow eye involvement and risks were discussed with the patient. Upon the finding of wet age related macular degeneration, treatment will be offered. The treatment regimen is on a treat as needed basis with the intent to treat if necessary and extend interval of exams when possible. On average 1 out of 6 patients do not need lifetime therapy. However, the risk of recurrent disease is high for a lifetime.  Initially monthly, then periodic, examinations and evaluations will determine whether the next treatment is required on the day of the examination.  Monocular patient left eye with preserved subfoveal fibrosis and yet good acuity from large mature CNVM which has become the new outer retinal blood supply in the fovea.  Intraretinal hemorrhage inferiorly remains, will repeat injection Avastin today to maintain function

## 2020-10-12 ENCOUNTER — Other Ambulatory Visit: Payer: Self-pay

## 2020-10-12 ENCOUNTER — Ambulatory Visit (INDEPENDENT_AMBULATORY_CARE_PROVIDER_SITE_OTHER): Payer: Medicare Other | Admitting: General Practice

## 2020-10-12 DIAGNOSIS — I4891 Unspecified atrial fibrillation: Secondary | ICD-10-CM

## 2020-10-12 DIAGNOSIS — Z7901 Long term (current) use of anticoagulants: Secondary | ICD-10-CM

## 2020-10-12 LAB — POCT INR: INR: 2.8 (ref 2.0–3.0)

## 2020-10-12 NOTE — Patient Instructions (Addendum)
Pre visit review using our clinic review tool, if applicable. No additional management support is needed unless otherwise documented below in the visit note.  Continue to take 1 tablet daily except 2 tablets every Monday. Re-check in 6 weeks. 

## 2020-10-22 DIAGNOSIS — L821 Other seborrheic keratosis: Secondary | ICD-10-CM | POA: Diagnosis not present

## 2020-10-22 DIAGNOSIS — L820 Inflamed seborrheic keratosis: Secondary | ICD-10-CM | POA: Diagnosis not present

## 2020-10-22 DIAGNOSIS — D225 Melanocytic nevi of trunk: Secondary | ICD-10-CM | POA: Diagnosis not present

## 2020-10-22 DIAGNOSIS — L918 Other hypertrophic disorders of the skin: Secondary | ICD-10-CM | POA: Diagnosis not present

## 2020-10-22 DIAGNOSIS — L570 Actinic keratosis: Secondary | ICD-10-CM | POA: Diagnosis not present

## 2020-10-22 DIAGNOSIS — Z85828 Personal history of other malignant neoplasm of skin: Secondary | ICD-10-CM | POA: Diagnosis not present

## 2020-11-04 DIAGNOSIS — Z961 Presence of intraocular lens: Secondary | ICD-10-CM | POA: Diagnosis not present

## 2020-11-04 DIAGNOSIS — H353114 Nonexudative age-related macular degeneration, right eye, advanced atrophic with subfoveal involvement: Secondary | ICD-10-CM | POA: Diagnosis not present

## 2020-11-04 DIAGNOSIS — H353221 Exudative age-related macular degeneration, left eye, with active choroidal neovascularization: Secondary | ICD-10-CM | POA: Diagnosis not present

## 2020-11-04 DIAGNOSIS — H43811 Vitreous degeneration, right eye: Secondary | ICD-10-CM | POA: Diagnosis not present

## 2020-11-16 ENCOUNTER — Encounter: Payer: Self-pay | Admitting: Family Medicine

## 2020-11-16 ENCOUNTER — Ambulatory Visit (INDEPENDENT_AMBULATORY_CARE_PROVIDER_SITE_OTHER): Payer: Medicare Other | Admitting: Family Medicine

## 2020-11-16 ENCOUNTER — Other Ambulatory Visit: Payer: Self-pay

## 2020-11-16 VITALS — BP 152/78 | HR 70 | Wt 169.0 lb

## 2020-11-16 DIAGNOSIS — R3915 Urgency of urination: Secondary | ICD-10-CM

## 2020-11-16 DIAGNOSIS — D509 Iron deficiency anemia, unspecified: Secondary | ICD-10-CM

## 2020-11-16 LAB — CBC WITH DIFFERENTIAL/PLATELET
Basophils Absolute: 0 10*3/uL (ref 0.0–0.1)
Basophils Relative: 0.5 % (ref 0.0–3.0)
Eosinophils Absolute: 0.1 10*3/uL (ref 0.0–0.7)
Eosinophils Relative: 2.6 % (ref 0.0–5.0)
HCT: 40.2 % (ref 39.0–52.0)
Hemoglobin: 13.8 g/dL (ref 13.0–17.0)
Lymphocytes Relative: 23.6 % (ref 12.0–46.0)
Lymphs Abs: 1.3 10*3/uL (ref 0.7–4.0)
MCHC: 34.3 g/dL (ref 30.0–36.0)
MCV: 86.6 fl (ref 78.0–100.0)
Monocytes Absolute: 0.7 10*3/uL (ref 0.1–1.0)
Monocytes Relative: 13.3 % — ABNORMAL HIGH (ref 3.0–12.0)
Neutro Abs: 3.4 10*3/uL (ref 1.4–7.7)
Neutrophils Relative %: 60 % (ref 43.0–77.0)
Platelets: 274 10*3/uL (ref 150.0–400.0)
RBC: 4.64 Mil/uL (ref 4.22–5.81)
RDW: 15.5 % (ref 11.5–15.5)
WBC: 5.6 10*3/uL (ref 4.0–10.5)

## 2020-11-16 LAB — IBC + FERRITIN
Ferritin: 31.9 ng/mL (ref 22.0–322.0)
Iron: 53 ug/dL (ref 42–165)
Saturation Ratios: 12.1 % — ABNORMAL LOW (ref 20.0–50.0)
Transferrin: 313 mg/dL (ref 212.0–360.0)

## 2020-11-16 LAB — IRON: Iron: 53 ug/dL (ref 42–165)

## 2020-11-16 NOTE — Progress Notes (Signed)
Established Patient Office Visit  Subjective:  Patient ID: Mark Wu, male    DOB: 02-13-1926  Age: 87 y.o. MRN: [id number]  CC:  Chief Complaint  Patient presents with  . Follow-up    HPI Mark Wu presents for iron deficiency anemia.  Refer to note from back in November for details.  Hemoccult was negative at that time.  He had hemoglobin back in October 2010 range and this came up to 13.3.  His labs did confirm iron deficiency.  We discussed possible colonoscopy evaluation and GI referral but he declined.  He is not seeing any blood in his stools.  No stool changes.  Appetite stable.  Weight is down 3 pounds from then.  His wife confirms that he is eating very well.  Last colonoscopy was about 20 years ago  He does complain of some nocturia and urine urgency but no slow stream.  He does take Flomax.  Usually gets up about 3 times at night to urinate.  No recent burning with urination.  Does drink some coffee and tea but not consistently.  Usually drinks some water before going to bed to take medications.  Past Medical History:  Diagnosis Date  . Anal fissure 06/11/2009  . Atrial fibrillation ([place]) 06/11/2009  . HYPERTENSION 06/11/2009  . NECK MASS 10/01/2010  . Prostate cancer [facility])     Past Surgical History:  Procedure Laterality Date  . CARPAL TUNNEL RELEASE     Both hands  . CYST REMOVAL HAND     Left hand  . CYSTOSCOPY  2000   hand  . PROSTATE BIOPSY      Family History  Problem Relation Age of Onset  . Cancer Sister 39       unknown type  . Cancer Brother        prostate/seed implant with [name]/died January 20, 2014 of heart attack    Social History   Socioeconomic History  . Marital status: Married    Spouse name: Not on file  . Number of children: Not on file  . Years of education: Not on file  . Highest education level: Not on file  Occupational History  . Occupation: retired    Comment: retired from [employer] in [place] Use  . Smoking status: Former  Smoker    Years: 50.00    Types: Cigars    Quit date: 08/08/1991    Years since quitting: 29.2  . Smokeless tobacco: Never Used  Vaping Use  . Vaping Use: Never used  Substance and Sexual Activity  . Alcohol use: No  . Drug use: No  . Sexual activity: Never  Other Topics Concern  . Not on file  Social History Narrative   Resides in [place] (near [place], [place])   One story home   Occasionally caffeine   Right handed   Social Determinants of Health   Financial Resource Strain: Not on file  Food Insecurity: Not on file  Transportation Needs: Not on file  Physical Activity: Not on file  Stress: Not on file  Social Connections: Not on file  Intimate Partner Violence: Not on file    Outpatient Medications Prior to Visit  Medication Sig Dispense Refill  . amLODipine (NORVASC) 5 MG tablet TAKE 1 TABLET DAILY (NEED PHYSICAL) (Patient taking differently: Take 5 mg by mouth daily. ) 90 tablet 3  . Calcium Carb-Cholecalciferol (CALCIUM CARBONATE-VITAMIN D3 PO) Take by mouth.    . clotrimazole-betamethasone (LOTRISONE) cream Apply topically as needed. 45 g 1  .  latanoprost (XALATAN) 0.005 % ophthalmic solution INSTILL 1 DROP IN THE LEFT EYE NIGHTLY. (DISCARD 42 DAYS AFTER OPENING) (Patient taking differently: Place 1 drop into the left eye at bedtime. ) 5 mL 3  . tamsulosin (FLOMAX) 0.4 MG CAPS capsule Take 1 capsule (0.4 mg total) daily by mouth. 90 capsule 1  . warfarin (COUMADIN) 2.5 MG tablet TAKE 1 TABLET DAILY EXCEPT TAKE 2 TABLETS ON TUESDAY OR TAKE AS DIRECTED BY ANTICOAGULATION CLINIC 120 tablet 3   No facility-administered medications prior to visit.    No Known Allergies  ROS Review of Systems  Constitutional: Negative for appetite change, chills and fever.  Respiratory: Negative for cough and shortness of breath.   Cardiovascular: Negative for chest pain.  Gastrointestinal: Negative for abdominal pain, blood in stool, diarrhea, nausea and vomiting.  Genitourinary:  Negative for dysuria.  Neurological: Negative for dizziness.      Objective:    Physical Exam Vitals reviewed.  Constitutional:      Appearance: Normal appearance.  Cardiovascular:     Rate and Rhythm: Normal rate and regular rhythm.  Pulmonary:     Effort: Pulmonary effort is normal.     Breath sounds: Normal breath sounds.  Abdominal:     Palpations: Abdomen is soft. There is no mass.     Tenderness: There is no abdominal tenderness.  Neurological:     Mental Status: He is alert.     BP (!) 152/78   Pulse 70   Wt 169 lb (76.7 kg)   SpO2 98%   BMI 24.96 kg/m  Wt Readings from Last 3 Encounters:  11/16/20 169 lb (76.7 kg)  09/15/20 173 lb (78.5 kg)  09/04/20 172 lb (78 kg)     Health Maintenance Due  Topic Date Due  . TETANUS/TDAP  Never done    There are no preventive care reminders to display for this patient.  Lab Results  Component Value Date   TSH 1.018 08/16/2020   Lab Results  Component Value Date   WBC 5.6 09/07/2020   HGB 13.3 09/07/2020   HCT 36.9 (L) 09/07/2020   MCV 90.0 09/07/2020   PLT 288 09/07/2020   Lab Results  Component Value Date   NA 143 09/07/2020   K 5.2 09/07/2020   CO2 25 09/07/2020   GLUCOSE 95 09/07/2020   BUN 12 09/07/2020   CREATININE 0.90 09/07/2020   BILITOT 0.9 04/04/2017   ALKPHOS 45 04/04/2017   AST 17 04/04/2017   ALT 12 04/04/2017   PROT 6.6 04/04/2017   ALBUMIN 4.1 04/04/2017   CALCIUM 10.1 09/07/2020   ANIONGAP 8 08/17/2020   GFR 78.45 04/04/2017   Lab Results  Component Value Date   CHOL 142 09/04/2020   Lab Results  Component Value Date   HDL 36.10 (L) 09/04/2020   Lab Results  Component Value Date   LDLCALC 83 09/04/2020   Lab Results  Component Value Date   TRIG 114.0 09/04/2020   Lab Results  Component Value Date   CHOLHDL 4 09/04/2020   No results found for: HGBA1C    Assessment & Plan:   #1 history of iron deficiency anemia.  Recent Hemoccults back in November negative.   Question intermittent bleed from AVM.  Has had very mild weight loss since last visit but good appetite.  -Recheck CBC, ferritin, iron studies -We again discussed possible referral to GI but they declined at this time given his age. -We discussed iron rich diet  #2 urinary urgency -Avoid  late the use of caffeine and excessive fluids at night -We did discuss possible medication options but after discussing possible anticholinergic effects he would like to avoid if possible  No orders of the defined types were placed in this encounter.   Follow-up: No follow-ups on file.    [name], MD

## 2020-11-16 NOTE — Patient Instructions (Signed)

## 2020-11-17 ENCOUNTER — Encounter (INDEPENDENT_AMBULATORY_CARE_PROVIDER_SITE_OTHER): Payer: Self-pay | Admitting: Ophthalmology

## 2020-11-17 ENCOUNTER — Telehealth: Payer: Self-pay

## 2020-11-17 ENCOUNTER — Ambulatory Visit (INDEPENDENT_AMBULATORY_CARE_PROVIDER_SITE_OTHER): Payer: Medicare Other | Admitting: Ophthalmology

## 2020-11-17 DIAGNOSIS — H353212 Exudative age-related macular degeneration, right eye, with inactive choroidal neovascularization: Secondary | ICD-10-CM

## 2020-11-17 DIAGNOSIS — H353221 Exudative age-related macular degeneration, left eye, with active choroidal neovascularization: Secondary | ICD-10-CM | POA: Diagnosis not present

## 2020-11-17 MED ORDER — BEVACIZUMAB 2.5 MG/0.1ML IZ SOSY
2.5000 mg | PREFILLED_SYRINGE | INTRAVITREAL | Status: AC | PRN
  Administered 2020-11-17: 2.5 mg via INTRAVITREAL

## 2020-11-17 NOTE — Progress Notes (Signed)
11/17/2020     CHIEF COMPLAINT Patient presents for Retina Follow Up (6 Week Wet AMD f\u OS. Possible Avastin OS. OCT/Pt states vision is about the same. Denies any new complaints. Recently got an updated gls rx from Dr. Katy Fitch)   HISTORY OF PRESENT ILLNESS: Mark Wu is a 87 y.o. male who presents to the clinic today for:   HPI    Retina Follow Up    Patient presents with  Wet AMD.  In left eye.  Severity is moderate.  Duration of 6 weeks.  Since onset it is stable.  I, the attending physician,  performed the HPI with the patient and updated documentation appropriately. Additional comments: 6 Week Wet AMD f\u OS. Possible Avastin OS. OCT Pt states vision is about the same. Denies any new complaints. Recently got an updated gls rx from Dr. Katy Fitch       Last edited by Tilda Franco on 11/17/2020  9:56 AM. (History)      Referring physician: [name], MD [place],  [place] [id number]  HISTORICAL INFORMATION:   Selected notes from the MEDICAL RECORD NUMBER       CURRENT MEDICATIONS: Current Outpatient Medications (Ophthalmic Drugs)  Medication Sig  . latanoprost (XALATAN) 0.005 % ophthalmic solution INSTILL 1 DROP IN THE LEFT EYE NIGHTLY. (DISCARD 42 DAYS AFTER OPENING) (Patient taking differently: Place 1 drop into the left eye at bedtime. )   No current facility-administered medications for this visit. (Ophthalmic Drugs)   Current Outpatient Medications (Other)  Medication Sig  . amLODipine (NORVASC) 5 MG tablet TAKE 1 TABLET DAILY (NEED PHYSICAL) (Patient taking differently: Take 5 mg by mouth daily. )  . Calcium Carb-Cholecalciferol (CALCIUM CARBONATE-VITAMIN D3 PO) Take by mouth.  . clotrimazole-betamethasone (LOTRISONE) cream Apply topically as needed.  . tamsulosin (FLOMAX) 0.4 MG CAPS capsule Take 1 capsule (0.4 mg total) daily by mouth.  . warfarin (COUMADIN) 2.5 MG tablet TAKE 1 TABLET DAILY EXCEPT TAKE 2 TABLETS ON TUESDAY OR TAKE AS  DIRECTED BY ANTICOAGULATION CLINIC   No current facility-administered medications for this visit. (Other)      REVIEW OF SYSTEMS:    ALLERGIES No Known Allergies  PAST MEDICAL HISTORY Past Medical History:  Diagnosis Date  . Anal fissure 06/11/2009  . Atrial fibrillation ([place]) 06/11/2009  . HYPERTENSION 06/11/2009  . NECK MASS 10/01/2010  . Prostate cancer [facility])    Past Surgical History:  Procedure Laterality Date  . CARPAL TUNNEL RELEASE     Both hands  . CYST REMOVAL HAND     Left hand  . CYSTOSCOPY  2000   hand  . PROSTATE BIOPSY      FAMILY HISTORY Family History  Problem Relation Age of Onset  . Cancer Sister 82       unknown type  . Cancer Brother        prostate/seed implant with [name]/died 2014-02-16 of heart attack    SOCIAL HISTORY Social History   Tobacco Use  . Smoking status: Former Smoker    Years: 50.00    Types: Cigars    Quit date: 08/08/1991    Years since quitting: 29.2  . Smokeless tobacco: Never Used  Vaping Use  . Vaping Use: Never used  Substance Use Topics  . Alcohol use: No  . Drug use: No         OPHTHALMIC EXAM:  Base Eye Exam    Visual Acuity (Snellen - Linear)  Right Left   Dist [place] CF @ 3'    Dist cc  20/40 +1   Dist ph [place] NI    Dist ph cc  20/30 -1       Tonometry (Tonopen, 10:01 AM)      Right Left   Pressure 13 14       Pupils      Pupils Dark Light Shape React APD   Right PERRL 5 4 Round Brisk None   Left PERRL 5 4 Round Brisk None       Visual Fields (Counting fingers)      Left Right    Full Full       Neuro/Psych    Oriented x3: Yes   Mood/Affect: Normal       Dilation    Left eye: 1.0% Mydriacyl, 2.5% Phenylephrine @ 10:01 AM        Slit Lamp and Fundus Exam    External Exam      Right Left   External Normal Normal       Slit Lamp Exam      Right Left   Lids/Lashes Normal Normal   Conjunctiva/Sclera White and quiet White and quiet   Cornea Clear Clear   Anterior Chamber Deep  and quiet Deep and quiet   Iris Round and reactive Round and reactive   Lens Centered posterior chamber intraocular lens Centered posterior chamber intraocular lens   Anterior Vitreous Normal Normal       Fundus Exam      Right Left   Posterior Vitreous  Posterior vitreous detachment, Central vitreous floaters   Disc  Peripapillary atrophy   C/D Ratio  0.15   Macula  Less yet persistent inferiorly subretinal hemorrhage temporal to the fovea,,, Disciform scar with large mature choroidal vessels as the new blood supply to the macula, stable for years, Retinal pigment epithelial atrophy, Geographic atrophy   Vessels  Normal   Periphery  Normal          IMAGING AND PROCEDURES  Imaging and Procedures for 11/17/20  OCT, Retina - OU - Both Eyes       Right Eye Quality was good. Scan locations included subfoveal. Central Foveal Thickness: 340. Progression has been stable. Findings include abnormal foveal contour, subretinal hyper-reflective material, subretinal scarring.   Left Eye Quality was good. Scan locations included subfoveal. Central Foveal Thickness: 345. Progression has been stable. Findings include subretinal scarring, choroidal neovascular membrane, disciform scar, abnormal foveal contour, no SRF, no IRF.   Notes OS, stabilize lesion nasal to the fovea, chronically active and yet only stable at 6-\week  follow-up interval.  Repeat in Avastin OS today  OD with massive permanent disciform fibrotic scar, stable overall       Intravitreal Injection, Pharmacologic Agent - OS - Left Eye       Time Out 11/17/2020. 10:20 AM. Confirmed correct patient, procedure, site, and patient consented.   Anesthesia Topical anesthesia was used. Anesthetic medications included Akten 3.5%.   Procedure Preparation included Tobramycin 0.3%, 10% betadine to eyelids, 5% betadine to ocular surface, Ofloxacin . A 30 gauge needle was used.   Injection:  2.5 mg Bevacizumab (AVASTIN)  2.5mg /0.74mL SOSY   NDC: 41660-630-16, Lot: [id number]   Route: Intravitreal, Site: Left Eye  Post-op Post injection exam found visual acuity of at least counting fingers. The patient tolerated the procedure well. There were no complications. The patient received written and verbal post procedure care education. Post injection medications were not  given.                 ASSESSMENT/PLAN:  Exudative age-related macular degeneration of left eye with active choroidal neovascularization (HCC) Wet ARMD OS, chronically active, with white fibrotic subfoveal scar yet with a ring of subretinal reddish hue and some hemorrhage.  Stabilized only on 6-week follow-up now for some years.  We will repeat injection today and this monocular patient to maintain  Exudative age-related macular degeneration of right eye with inactive choroidal neovascularization ([place]) OD no active disease by OCT dilate OU next      ICD-10-CM   1. Exudative age-related macular degeneration of left eye with active choroidal neovascularization (HCC)  H35.3221 OCT, Retina - OU - Both Eyes    Intravitreal Injection, Pharmacologic Agent - OS - Left Eye    bevacizumab (AVASTIN) SOSY 2.5 mg  2. Exudative age-related macular degeneration of right eye with inactive choroidal neovascularization ([place])  H35.3212     1.  OS preserved visual acuity, subfoveal mature CNVM is now the new blood supply to the outer retina OS centrally.  2.  We will repeat injection today to prevent immature vessels in the periphery of the macular region  3.  Ophthalmic Meds Ordered this visit:  Meds ordered this encounter  Medications  . bevacizumab (AVASTIN) SOSY 2.5 mg       Return in about 6 weeks (around 12/29/2020) for DILATE OU, AVASTIN OCT, OS.  There are no Patient Instructions on file for this visit.   Explained the diagnoses, plan, and follow up with the patient and they expressed understanding.  Patient expressed understanding of the  importance of proper follow up care.   Clent Demark Zareena Willis M.D. Diseases & Surgery of the Retina and Vitreous Retina & Diabetic [place] 11/17/20     Abbreviations: M myopia (nearsighted); A astigmatism; H hyperopia (farsighted); P presbyopia; Mrx spectacle prescription;  CTL contact lenses; OD right eye; OS left eye; OU both eyes  XT exotropia; ET esotropia; PEK punctate epithelial keratitis; PEE punctate epithelial erosions; DES dry eye syndrome; MGD meibomian gland dysfunction; ATs artificial tears; PFAT's preservative free artificial tears; [place] nuclear sclerotic cataract; PSC posterior subcapsular cataract; ERM epi-retinal membrane; PVD posterior vitreous detachment; RD retinal detachment; DM diabetes mellitus; DR diabetic retinopathy; NPDR non-proliferative diabetic retinopathy; PDR proliferative diabetic retinopathy; CSME clinically significant macular edema; DME diabetic macular edema; dbh dot blot hemorrhages; CWS cotton wool spot; POAG primary open angle glaucoma; C/D cup-to-disc ratio; HVF humphrey visual field; GVF goldmann visual field; OCT optical coherence tomography; IOP intraocular pressure; BRVO Branch retinal vein occlusion; CRVO central retinal vein occlusion; CRAO central retinal artery occlusion; BRAO branch retinal artery occlusion; RT retinal tear; SB scleral buckle; PPV pars plana vitrectomy; VH Vitreous hemorrhage; PRP panretinal laser photocoagulation; IVK intravitreal kenalog; VMT vitreomacular traction; MH Macular hole;  NVD neovascularization of the disc; NVE neovascularization elsewhere; AREDS age related eye disease study; ARMD age related macular degeneration; POAG primary open angle glaucoma; EBMD epithelial/anterior basement membrane dystrophy; ACIOL anterior chamber intraocular lens; IOL intraocular lens; PCIOL posterior chamber intraocular lens; Phaco/IOL phacoemulsification with intraocular lens placement; [place] photorefractive keratectomy; LASIK laser assisted in situ  keratomileusis; HTN hypertension; DM diabetes mellitus; COPD chronic obstructive pulmonary disease

## 2020-11-17 NOTE — Assessment & Plan Note (Signed)
Wet ARMD OS, chronically active, with white fibrotic subfoveal scar yet with a ring of subretinal reddish hue and some hemorrhage.  Stabilized only on 6-week follow-up now for some years.  We will repeat injection today and this monocular patient to maintain

## 2020-11-17 NOTE — Telephone Encounter (Signed)
Left message informing patient of results and recommendations. 

## 2020-11-17 NOTE — Telephone Encounter (Signed)
-----   Message from [name], MD sent at 11/16/2020  4:53 PM EST ----- Hemoglobin is stable/normal and iron levels have improved.  I think at this point he can stay on iron rich diet without iron supplementation

## 2020-11-17 NOTE — Assessment & Plan Note (Signed)
OD no active disease by OCT dilate OU next

## 2020-11-23 ENCOUNTER — Ambulatory Visit: Payer: Medicare Other

## 2020-11-30 ENCOUNTER — Other Ambulatory Visit: Payer: Self-pay

## 2020-11-30 ENCOUNTER — Ambulatory Visit (INDEPENDENT_AMBULATORY_CARE_PROVIDER_SITE_OTHER): Payer: Medicare Other | Admitting: General Practice

## 2020-11-30 DIAGNOSIS — Z7901 Long term (current) use of anticoagulants: Secondary | ICD-10-CM

## 2020-11-30 DIAGNOSIS — I4891 Unspecified atrial fibrillation: Secondary | ICD-10-CM | POA: Diagnosis not present

## 2020-11-30 LAB — POCT INR: INR: 1.8 — AB (ref 2.0–3.0)

## 2020-11-30 NOTE — Patient Instructions (Addendum)
Pre visit review using our clinic review tool, if applicable. No additional management support is needed unless otherwise documented below in the visit note.  Take 3 tablets today (2/14) and then continue to take 1 tablet daily except 2 tablets every Monday. Re-check in 4 to 6 weeks.

## 2020-12-29 ENCOUNTER — Other Ambulatory Visit: Payer: Self-pay

## 2020-12-29 ENCOUNTER — Encounter (INDEPENDENT_AMBULATORY_CARE_PROVIDER_SITE_OTHER): Payer: Self-pay | Admitting: Ophthalmology

## 2020-12-29 ENCOUNTER — Ambulatory Visit (INDEPENDENT_AMBULATORY_CARE_PROVIDER_SITE_OTHER): Payer: Medicare Other | Admitting: Ophthalmology

## 2020-12-29 DIAGNOSIS — H353221 Exudative age-related macular degeneration, left eye, with active choroidal neovascularization: Secondary | ICD-10-CM | POA: Diagnosis not present

## 2020-12-29 DIAGNOSIS — H353212 Exudative age-related macular degeneration, right eye, with inactive choroidal neovascularization: Secondary | ICD-10-CM

## 2020-12-29 MED ORDER — BEVACIZUMAB 2.5 MG/0.1ML IZ SOSY
2.5000 mg | PREFILLED_SYRINGE | INTRAVITREAL | Status: AC | PRN
  Administered 2020-12-29: 2.5 mg via INTRAVITREAL

## 2020-12-29 NOTE — Assessment & Plan Note (Signed)
Old disciform OD, inactive no change

## 2020-12-29 NOTE — Assessment & Plan Note (Signed)
Disciform scar centrally with small area of activity superior to the fovea with subretinal hemorrhage, at 6-week interval today.  Repeat injection today to maintain visual acuity functioning as well as shorten interval follow-up to 5-week

## 2020-12-29 NOTE — Progress Notes (Signed)
12/29/2020     CHIEF COMPLAINT Patient presents for Retina Follow Up (6 Wk F/U OU, poss Avastin OS//Pt denies noticeable changes to [place] OU since last visit. Pt denies ocular pain, flashes of light, or floaters OU. Pt sts new glasses are helping.)   HISTORY OF PRESENT ILLNESS: Mark Wu is a 87 y.o. male who presents to the clinic today for:   HPI    Retina Follow Up    Patient presents with  Wet AMD.  In left eye.  This started 6 weeks ago.  Severity is moderate.  Duration of 6 weeks.  Since onset it is stable. Additional comments: 6 Wk F/U OU, poss Avastin OS  Pt denies noticeable changes to [place] OU since last visit. Pt denies ocular pain, flashes of light, or floaters OU. Pt sts new glasses are helping.       Last edited by Rockie Neighbours, [place] on 12/29/2020  9:46 AM. (History)      Referring physician: [name], MD [place],  [place] [id number]  HISTORICAL INFORMATION:   Selected notes from the MEDICAL RECORD NUMBER       CURRENT MEDICATIONS: Current Outpatient Medications (Ophthalmic Drugs)  Medication Sig  . latanoprost (XALATAN) 0.005 % ophthalmic solution INSTILL 1 DROP IN THE LEFT EYE NIGHTLY. (DISCARD 42 DAYS AFTER OPENING) (Patient taking differently: Place 1 drop into the left eye at bedtime.)   No current facility-administered medications for this visit. (Ophthalmic Drugs)   Current Outpatient Medications (Other)  Medication Sig  . amLODipine (NORVASC) 5 MG tablet TAKE 1 TABLET DAILY (NEED PHYSICAL) (Patient taking differently: Take 5 mg by mouth daily. )  . Calcium Carb-Cholecalciferol (CALCIUM CARBONATE-VITAMIN D3 PO) Take by mouth.  . clotrimazole-betamethasone (LOTRISONE) cream Apply topically as needed.  . tamsulosin (FLOMAX) 0.4 MG CAPS capsule Take 1 capsule (0.4 mg total) daily by mouth.  . warfarin (COUMADIN) 2.5 MG tablet TAKE 1 TABLET DAILY EXCEPT TAKE 2 TABLETS ON TUESDAY OR TAKE AS DIRECTED BY ANTICOAGULATION CLINIC    No current facility-administered medications for this visit. (Other)      REVIEW OF SYSTEMS:    ALLERGIES No Known Allergies  PAST MEDICAL HISTORY Past Medical History:  Diagnosis Date  . Anal fissure 06/11/2009  . Atrial fibrillation ([place]) 06/11/2009  . HYPERTENSION 06/11/2009  . NECK MASS 10/01/2010  . Prostate cancer [facility])    Past Surgical History:  Procedure Laterality Date  . CARPAL TUNNEL RELEASE     Both hands  . CYST REMOVAL HAND     Left hand  . CYSTOSCOPY  2000   hand  . PROSTATE BIOPSY      FAMILY HISTORY Family History  Problem Relation Age of Onset  . Cancer Sister 63       unknown type  . Cancer Brother        prostate/seed implant with [name]/died 01/20/14 of heart attack    SOCIAL HISTORY Social History   Tobacco Use  . Smoking status: Former Smoker    Years: 50.00    Types: Cigars    Quit date: 08/08/1991    Years since quitting: 29.4  . Smokeless tobacco: Never Used  Vaping Use  . Vaping Use: Never used  Substance Use Topics  . Alcohol use: No  . Drug use: No         OPHTHALMIC EXAM: Base Eye Exam    Visual Acuity (ETDRS)      Right Left   Dist cc CF  at 3' 20/30 +1   Dist ph cc NI 20/25 -2   Correction: Glasses       Tonometry (Tonopen, 9:46 AM)      Right Left   Pressure 14 13       Pupils      Pupils Dark Light Shape React APD   Right PERRL 5 4 Round Brisk None   Left PERRL 5 4 Round Brisk None       Visual Fields (Counting fingers)      Left Right    Full Full       Extraocular Movement      Right Left    Full Full       Neuro/Psych    Oriented x3: Yes   Mood/Affect: Normal       Dilation    Both eyes: 1.0% Mydriacyl, 2.5% Phenylephrine @ 9:49 AM        Slit Lamp and Fundus Exam    External Exam      Right Left   External Normal Normal       Slit Lamp Exam      Right Left   Lids/Lashes Normal Normal   Conjunctiva/Sclera White and quiet White and quiet   Cornea Clear Clear   Anterior  Chamber Deep and quiet Deep and quiet   Iris Round and reactive Round and reactive   Lens Centered posterior chamber intraocular lens Centered posterior chamber intraocular lens   Anterior Vitreous Normal Normal       Fundus Exam      Right Left   Posterior Vitreous Posterior vitreous detachment Posterior vitreous detachment, Central vitreous floaters   Disc Normal Peripapillary atrophy   C/D Ratio 0.2 0.15   Macula Hard drusen, Geographic atrophy, Disciform scar Less yet persistent subretinal hemorrhage small and superior to the fovea,,, Disciform scar with large mature choroidal vessels as the new blood supply to the macula, stable for years, Retinal pigment epithelial atrophy, Geographic atrophy   Vessels Normal Normal   Periphery Normal Normal          IMAGING AND PROCEDURES  Imaging and Procedures for 12/29/20  OCT, Retina - OU - Both Eyes       Right Eye Quality was good. Scan locations included subfoveal. Central Foveal Thickness: 317. Progression has been stable. Findings include abnormal foveal contour, subretinal hyper-reflective material, subretinal scarring.   Left Eye Quality was good. Scan locations included subfoveal. Central Foveal Thickness: 342. Progression has been stable. Findings include subretinal scarring, choroidal neovascular membrane, disciform scar, abnormal foveal contour, no SRF, no IRF.   Notes OS, stabilize lesion nasal to the fovea, chronically active and yet only stable at 6-\week  follow-up interval.  Repeat in Avastin OS today  OD with massive permanent disciform fibrotic scar, stable overall       Intravitreal Injection, Pharmacologic Agent - OS - Left Eye       Time Out 12/29/2020. 10:09 AM. Confirmed correct patient, procedure, site, and patient consented.   Anesthesia Topical anesthesia was used. Anesthetic medications included Akten 3.5%.   Procedure Preparation included Tobramycin 0.3%, 10% betadine to eyelids, 5% betadine to  ocular surface, Ofloxacin . A 30 gauge needle was used.   Injection:  2.5 mg Bevacizumab (AVASTIN) 2.5mg /0.68mL SOSY   NDC: 01093-235-57, Lot: [id number]   Route: Intravitreal, Site: Left Eye  Post-op Post injection exam found visual acuity of at least counting fingers. The patient tolerated the procedure well. There were no complications. The patient received  written and verbal post procedure care education. Post injection medications were not given.                 ASSESSMENT/PLAN:  Exudative age-related macular degeneration of right eye with inactive choroidal neovascularization (HCC) Old disciform OD, inactive no change  Exudative age-related macular degeneration of left eye with active choroidal neovascularization (HCC) Disciform scar centrally with small area of activity superior to the fovea with subretinal hemorrhage, at 6-week interval today.  Repeat injection today to maintain visual acuity functioning as well as shorten interval follow-up to 5-week      ICD-10-CM   1. Exudative age-related macular degeneration of left eye with active choroidal neovascularization (HCC)  H35.3221 OCT, Retina - OU - Both Eyes    Intravitreal Injection, Pharmacologic Agent - OS - Left Eye    bevacizumab (AVASTIN) SOSY 2.5 mg  2. Exudative age-related macular degeneration of right eye with inactive choroidal neovascularization ([place])  H35.3212     1.  OD, not active lesion, acuity stable no risk for scotoma enlargement from prior disciform scar  2.  OS, subfoveal disciform chronic and active over the last years with preserved good acuity, a area of activity lesion growth superior at 6-week interval repeat Avastin today and reexamination in 5-week  3.  Ophthalmic Meds Ordered this visit:  Meds ordered this encounter  Medications  . bevacizumab (AVASTIN) SOSY 2.5 mg       Return in about 5 weeks (around 02/02/2021) for dilate, OS, AVASTIN OCT.  There are no Patient Instructions on file  for this visit.   Explained the diagnoses, plan, and follow up with the patient and they expressed understanding.  Patient expressed understanding of the importance of proper follow up care.   Clent Demark Kalianne Fetting M.D. Diseases & Surgery of the Retina and Vitreous Retina & Diabetic [place] 12/29/20     Abbreviations: M myopia (nearsighted); A astigmatism; H hyperopia (farsighted); P presbyopia; Mrx spectacle prescription;  CTL contact lenses; OD right eye; OS left eye; OU both eyes  XT exotropia; ET esotropia; PEK punctate epithelial keratitis; PEE punctate epithelial erosions; DES dry eye syndrome; MGD meibomian gland dysfunction; ATs artificial tears; PFAT's preservative free artificial tears; [place] nuclear sclerotic cataract; PSC posterior subcapsular cataract; ERM epi-retinal membrane; PVD posterior vitreous detachment; RD retinal detachment; DM diabetes mellitus; DR diabetic retinopathy; NPDR non-proliferative diabetic retinopathy; PDR proliferative diabetic retinopathy; CSME clinically significant macular edema; DME diabetic macular edema; dbh dot blot hemorrhages; CWS cotton wool spot; POAG primary open angle glaucoma; C/D cup-to-disc ratio; HVF humphrey visual field; GVF goldmann visual field; OCT optical coherence tomography; IOP intraocular pressure; BRVO Branch retinal vein occlusion; CRVO central retinal vein occlusion; CRAO central retinal artery occlusion; BRAO branch retinal artery occlusion; RT retinal tear; SB scleral buckle; PPV pars plana vitrectomy; VH Vitreous hemorrhage; PRP panretinal laser photocoagulation; IVK intravitreal kenalog; VMT vitreomacular traction; MH Macular hole;  NVD neovascularization of the disc; NVE neovascularization elsewhere; AREDS age related eye disease study; ARMD age related macular degeneration; POAG primary open angle glaucoma; EBMD epithelial/anterior basement membrane dystrophy; ACIOL anterior chamber intraocular lens; IOL intraocular lens; PCIOL  posterior chamber intraocular lens; Phaco/IOL phacoemulsification with intraocular lens placement; [place] photorefractive keratectomy; LASIK laser assisted in situ keratomileusis; HTN hypertension; DM diabetes mellitus; COPD chronic obstructive pulmonary disease

## 2021-01-11 ENCOUNTER — Ambulatory Visit (INDEPENDENT_AMBULATORY_CARE_PROVIDER_SITE_OTHER): Payer: Medicare Other | Admitting: General Practice

## 2021-01-11 ENCOUNTER — Other Ambulatory Visit: Payer: Self-pay

## 2021-01-11 DIAGNOSIS — Z7901 Long term (current) use of anticoagulants: Secondary | ICD-10-CM

## 2021-01-11 DIAGNOSIS — I4891 Unspecified atrial fibrillation: Secondary | ICD-10-CM | POA: Diagnosis not present

## 2021-01-11 LAB — POCT INR: INR: 2.5 (ref 2.0–3.0)

## 2021-01-11 NOTE — Patient Instructions (Signed)
Pre visit review using our clinic review tool, if applicable. No additional management support is needed unless otherwise documented below in the visit note.  Continue to take 1 tablet daily except 2 tablets every Monday. Re-check in 4 to 6 weeks.

## 2021-02-02 ENCOUNTER — Other Ambulatory Visit: Payer: Self-pay

## 2021-02-02 ENCOUNTER — Ambulatory Visit (INDEPENDENT_AMBULATORY_CARE_PROVIDER_SITE_OTHER): Payer: Medicare Other | Admitting: Ophthalmology

## 2021-02-02 ENCOUNTER — Encounter (INDEPENDENT_AMBULATORY_CARE_PROVIDER_SITE_OTHER): Payer: Self-pay | Admitting: Ophthalmology

## 2021-02-02 DIAGNOSIS — H353221 Exudative age-related macular degeneration, left eye, with active choroidal neovascularization: Secondary | ICD-10-CM

## 2021-02-02 DIAGNOSIS — H353212 Exudative age-related macular degeneration, right eye, with inactive choroidal neovascularization: Secondary | ICD-10-CM | POA: Diagnosis not present

## 2021-02-02 MED ORDER — BEVACIZUMAB 2.5 MG/0.1ML IZ SOSY
2.5000 mg | PREFILLED_SYRINGE | INTRAVITREAL | Status: AC | PRN
  Administered 2021-02-02: 2.5 mg via INTRAVITREAL

## 2021-02-02 NOTE — Progress Notes (Signed)
02/02/2021     CHIEF COMPLAINT Patient presents for Retina Follow Up (5 Wk F/U OS, poss Avastin OS//Pt denies noticeable changes to [place] OU since last visit. Pt denies ocular pain, flashes of light, or floaters OU. //)   HISTORY OF PRESENT ILLNESS: Mark Wu is a 87 y.o. male who presents to the clinic today for:   HPI    Retina Follow Up    Patient presents with  Wet AMD.  In left eye.  This started 5 weeks ago.  Severity is mild.  Duration of 5 weeks.  Since onset it is stable. Additional comments: 5 Wk F/U OS, poss Avastin OS  Pt denies noticeable changes to [place] OU since last visit. Pt denies ocular pain, flashes of light, or floaters OU.          Last edited by Rockie Neighbours, [place] on 02/02/2021 10:30 AM. (History)      Referring physician: [name], MD [place],  [place] [id number]  HISTORICAL INFORMATION:   Selected notes from the MEDICAL RECORD NUMBER       CURRENT MEDICATIONS: Current Outpatient Medications (Ophthalmic Drugs)  Medication Sig  . latanoprost (XALATAN) 0.005 % ophthalmic solution INSTILL 1 DROP IN THE LEFT EYE NIGHTLY. (DISCARD 42 DAYS AFTER OPENING) (Patient taking differently: Place 1 drop into the left eye at bedtime.)   No current facility-administered medications for this visit. (Ophthalmic Drugs)   Current Outpatient Medications (Other)  Medication Sig  . amLODipine (NORVASC) 5 MG tablet TAKE 1 TABLET DAILY (NEED PHYSICAL) (Patient taking differently: Take 5 mg by mouth daily. )  . Calcium Carb-Cholecalciferol (CALCIUM CARBONATE-VITAMIN D3 PO) Take by mouth.  . clotrimazole-betamethasone (LOTRISONE) cream Apply topically as needed.  . tamsulosin (FLOMAX) 0.4 MG CAPS capsule Take 1 capsule (0.4 mg total) daily by mouth.  . warfarin (COUMADIN) 2.5 MG tablet TAKE 1 TABLET DAILY EXCEPT TAKE 2 TABLETS ON TUESDAY OR TAKE AS DIRECTED BY ANTICOAGULATION CLINIC   No current facility-administered medications for this visit.  (Other)      REVIEW OF SYSTEMS:    ALLERGIES No Known Allergies  PAST MEDICAL HISTORY Past Medical History:  Diagnosis Date  . Anal fissure 06/11/2009  . Atrial fibrillation ([place]) 06/11/2009  . HYPERTENSION 06/11/2009  . NECK MASS 10/01/2010  . Prostate cancer [facility])    Past Surgical History:  Procedure Laterality Date  . CARPAL TUNNEL RELEASE     Both hands  . CYST REMOVAL HAND     Left hand  . CYSTOSCOPY  2000   hand  . PROSTATE BIOPSY      FAMILY HISTORY Family History  Problem Relation Age of Onset  . Cancer Sister 55       unknown type  . Cancer Brother        prostate/seed implant with [name]/died 01/30/14 of heart attack    SOCIAL HISTORY Social History   Tobacco Use  . Smoking status: Former Smoker    Years: 50.00    Types: Cigars    Quit date: 08/08/1991    Years since quitting: 29.5  . Smokeless tobacco: Never Used  Vaping Use  . Vaping Use: Never used  Substance Use Topics  . Alcohol use: No  . Drug use: No         OPHTHALMIC EXAM: Base Eye Exam    Visual Acuity (ETDRS)      Right Left   Dist cc CF at 3' 20/30 +2   Dist ph cc  NI NI   Correction: Glasses       Tonometry (Tonopen, 10:34 AM)      Right Left   Pressure 12 14       Pupils      Pupils Dark Light Shape React APD   Right PERRL 5 4 Round Brisk None   Left PERRL 5 4 Round Brisk None       Visual Fields (Counting fingers)      Left Right    Full Full       Extraocular Movement      Right Left    Full Full       Neuro/Psych    Oriented x3: Yes   Mood/Affect: Normal       Dilation    Left eye: 1.0% Mydriacyl, 2.5% Phenylephrine @ 10:34 AM        Slit Lamp and Fundus Exam    External Exam      Right Left   External Normal Normal       Slit Lamp Exam      Right Left   Lids/Lashes Normal Normal   Conjunctiva/Sclera White and quiet White and quiet   Cornea Clear Clear   Anterior Chamber Deep and quiet Deep and quiet   Iris Round and reactive Round and  reactive   Lens Centered posterior chamber intraocular lens Centered posterior chamber intraocular lens   Anterior Vitreous Normal Normal       Fundus Exam      Right Left   Posterior Vitreous  Posterior vitreous detachment, Central vitreous floaters   Disc  Peripapillary atrophy   C/D Ratio  0.15   Macula  Less yet persistent subretinal hemorrhage small and superior to the fovea,,, Disciform scar with large mature choroidal vessels as the new blood supply to the macula, stable for years, Retinal pigment epithelial atrophy, Geographic atrophy   Vessels  Normal   Periphery  Normal          IMAGING AND PROCEDURES  Imaging and Procedures for 02/02/21  OCT, Retina - OU - Both Eyes       Right Eye Quality was good. Scan locations included subfoveal. Central Foveal Thickness: 329. Progression has been stable. Findings include abnormal foveal contour, subretinal hyper-reflective material, subretinal scarring.   Left Eye Quality was good. Scan locations included subfoveal. Central Foveal Thickness: 328. Progression has improved. Findings include subretinal scarring, choroidal neovascular membrane, disciform scar, abnormal foveal contour, no SRF, no IRF.   Notes OS, stabilize lesion nasal to the fovea, chronically active and yet only stable at 5-6-\week  follow-up interval.  Repeat in Avastin OS today  OD with massive permanent disciform fibrotic scar, stable overall       Intravitreal Injection, Pharmacologic Agent - OS - Left Eye       Time Out 02/02/2021. 11:27 AM. Confirmed correct patient, procedure, site, and patient consented.   Anesthesia Topical anesthesia was used. Anesthetic medications included [place] 3.5%.   Procedure Preparation included Tobramycin 0.3%, 10% betadine to eyelids, 5% betadine to ocular surface, Ofloxacin . A 30 gauge needle was used.   Injection:  2.5 mg Bevacizumab (AVASTIN) 2.5mg /0.8mL SOSY   NDC: 02637-858-85, Lot: [id number]   Route:  Intravitreal, Site: Left Eye  Post-op Post injection exam found visual acuity of at least counting fingers. The patient tolerated the procedure well. There were no complications. The patient received written and verbal post procedure care education. Post injection medications were not given.  ASSESSMENT/PLAN:  Exudative age-related macular degeneration of left eye with active choroidal neovascularization (HCC) CNVM OS subfoveal, clinically subretinal hemorrhage present thus slight activity at the edges.  The fibrotic CNVM is also the blood supply to the outer retina.  Repeat Avastin today at 5-week interval and maintain 5 to 6 weeks exam OS      ICD-10-CM   1. Exudative age-related macular degeneration of left eye with active choroidal neovascularization (HCC)  H35.3221 OCT, Retina - OU - Both Eyes    Intravitreal Injection, Pharmacologic Agent - OS - Left Eye    bevacizumab (AVASTIN) SOSY 2.5 mg    1.  Chronic active CNVM subfoveal OS, clinically with SR heme, thus some activity.  We will maintain stability repeat by repeating Avastin OS today and examination again in 5 to 6 weeks 2.  3.  Ophthalmic Meds Ordered this visit:  Meds ordered this encounter  Medications  . bevacizumab (AVASTIN) SOSY 2.5 mg       Return for RV 5 to 6 weeks, dilate, OS, AVASTIN OCT.  There are no Patient Instructions on file for this visit.   Explained the diagnoses, plan, and follow up with the patient and they expressed understanding.  Patient expressed understanding of the importance of proper follow up care.   Clent Demark Olanrewaju Osborn M.D. Diseases & Surgery of the Retina and Vitreous [facility]ina [facility] [place] 02/02/21     Abbreviations: M myopia (nearsighted); A astigmatism; H hyperopia (farsighted); P presbyopia; Mrx spectacle prescription;  CTL contact lenses; OD right eye; OS left eye; OU both eyes  XT exotropia; ET esotropia; PEK punctate epithelial keratitis; PEE  punctate epithelial erosions; DES dry eye syndrome; MGD meibomian gland dysfunction; ATs artificial tears; PFAT's preservative free artificial tears; [place] nuclear sclerotic cataract; PSC posterior subcapsular cataract; ERM epi-retinal membrane; PVD posterior vitreous detachment; RD retinal detachment; DM diabetes mellitus; DR diabetic retinopathy; NPDR non-proliferative diabetic retinopathy; PDR proliferative diabetic retinopathy; CSME clinically significant macular edema; DME diabetic macular edema; dbh dot blot hemorrhages; CWS cotton wool spot; POAG primary open angle glaucoma; C/D cup-to-disc ratio; HVF humphrey visual field; GVF goldmann visual field; OCT optical coherence tomography; IOP intraocular pressure; BRVO Branch retinal vein occlusion; CRVO central retinal vein occlusion; CRAO central retinal artery occlusion; BRAO branch retinal artery occlusion; RT retinal tear; SB scleral buckle; PPV pars plana vitrectomy; VH Vitreous hemorrhage; PRP panretinal laser photocoagulation; IVK intravitreal kenalog; VMT vitreomacular traction; MH Macular hole;  NVD neovascularization of the disc; NVE neovascularization elsewhere; AREDS age related eye disease study; ARMD age related macular degeneration; POAG primary open angle glaucoma; EBMD epithelial/anterior basement membrane dystrophy; ACIOL anterior chamber intraocular lens; IOL intraocular lens; PCIOL posterior chamber intraocular lens; Phaco/IOL phacoemulsification with intraocular lens placement; [place] photorefractive keratectomy; LASIK laser assisted in situ keratomileusis; HTN hypertension; DM diabetes mellitus; COPD chronic obstructive pulmonary disease

## 2021-02-02 NOTE — Assessment & Plan Note (Signed)
No active disease OD today

## 2021-02-02 NOTE — Assessment & Plan Note (Signed)
CNVM OS subfoveal, clinically subretinal hemorrhage present thus slight activity at the edges.  The fibrotic CNVM is also the blood supply to the outer retina.  Repeat Avastin today at 5-week interval and maintain 5 to 6 weeks exam OS

## 2021-02-04 DIAGNOSIS — G5791 Unspecified mononeuropathy of right lower limb: Secondary | ICD-10-CM | POA: Diagnosis not present

## 2021-02-22 ENCOUNTER — Ambulatory Visit: Payer: Medicare Other

## 2021-02-25 ENCOUNTER — Other Ambulatory Visit (INDEPENDENT_AMBULATORY_CARE_PROVIDER_SITE_OTHER): Payer: Self-pay | Admitting: Ophthalmology

## 2021-03-01 ENCOUNTER — Other Ambulatory Visit: Payer: Self-pay

## 2021-03-01 ENCOUNTER — Ambulatory Visit (INDEPENDENT_AMBULATORY_CARE_PROVIDER_SITE_OTHER): Payer: Medicare Other | Admitting: General Practice

## 2021-03-01 DIAGNOSIS — I4891 Unspecified atrial fibrillation: Secondary | ICD-10-CM

## 2021-03-01 DIAGNOSIS — Z7901 Long term (current) use of anticoagulants: Secondary | ICD-10-CM

## 2021-03-01 LAB — POCT INR: INR: 2.5 (ref 2.0–3.0)

## 2021-03-01 NOTE — Patient Instructions (Addendum)
Pre visit review using our clinic review tool, if applicable. No additional management support is needed unless otherwise documented below in the visit note.  Continue to take 1 tablet daily except 2 tablets every Monday. Re-check in 4 to 6 weeks.

## 2021-03-05 NOTE — Progress Notes (Signed)
NEUROLOGY FOLLOW UP OFFICE NOTE  Mark Wu [id number]  Assessment/Plan:   1.  Transient ischemic attack presenting with right leg numbness and weakness. 2.  Atrial fibrillation 3.  Hypertension  1.  Secondary stroke prevention as managed by PCP: -  Warfarin - Normotensive blood pressure - LDL goal less than 70 - Glycemic control.  Hgb A1c goal less than 7 2.  Follow up in 6 months.  Subjective:  Mark Wu is a 87 year old right-handed male with atrial fibrillation, HTN, macular degeneration and history of prostate cancer who follows up for TIA.  He is accompanied by his wife.  UPDATE: Current medications:  [place], amlodipine  LDL in November was 83.  CTA of head and neck on 09/25/2020 showed mild stenosis of right PCA P2 and mild atherosclerotic changes but no large vessel occlusion or hemodynamically significant stenosis.  Sometimes feels a sharp paroxysmal pain in the right heel every now and then.  No recurrence of weakness and numbness of the entire right lower extremity.  With the cane, balance is better.  HISTORY: On 08/09/2020, he had fallen on his left buttock, sustaining a hematoma.  He was told to hold his Coumadin for 2 days.  On 08/16/2020, he stood up from the chair at breakfast and he couldn't feel his right leg.  His entire right leg from hip to foot, was numb and weak.  He denied associated pain or weakness and no involvement of the face, upper extremity or speech.  He denied incontinence.  Lumbar spine X-ray personally reviewed showed moderate to severe degenerative changes but no acute fracture.  CT of head personally reviewed was negative for acute abnormality.  Follow up MRI of brain personally reviewed showed a punctate diffusion weighted hyperintense focus was seen in the right pons but only on axial diffusion-weighted sequences, reflecting image noise or less likely acute/early subacute infarct.  She was found to have hyponatremia with Na of 119, which  was treated.  Symptoms resolved before discharge the next day.  Event though symptoms resolved, he feels unsure of himself and has been relying on his walker to ambulate due to fear of falling.  PAST MEDICAL HISTORY: Past Medical History:  Diagnosis Date  . Anal fissure 06/11/2009  . Atrial fibrillation ([place]) 06/11/2009  . HYPERTENSION 06/11/2009  . NECK MASS 10/01/2010  . Prostate cancer [facility])     MEDICATIONS: Current Outpatient Medications on File Prior to Visit  Medication Sig Dispense Refill  . amLODipine (NORVASC) 5 MG tablet TAKE 1 TABLET DAILY (NEED PHYSICAL) (Patient taking differently: Take 5 mg by mouth daily. ) 90 tablet 3  . Calcium Carb-Cholecalciferol (CALCIUM CARBONATE-VITAMIN D3 PO) Take by mouth.    . clotrimazole-betamethasone (LOTRISONE) cream Apply topically as needed. 45 g 1  . latanoprost (XALATAN) 0.005 % ophthalmic solution Place 1 drop into the left eye at bedtime. 4.5 mL 3  . tamsulosin (FLOMAX) 0.4 MG CAPS capsule Take 1 capsule (0.4 mg total) daily by mouth. 90 capsule 1  . warfarin (COUMADIN) 2.5 MG tablet TAKE 1 TABLET DAILY EXCEPT TAKE 2 TABLETS ON TUESDAY OR TAKE AS DIRECTED BY ANTICOAGULATION CLINIC 120 tablet 3   No current facility-administered medications on file prior to visit.    ALLERGIES: No Known Allergies  FAMILY HISTORY: Family History  Problem Relation Age of Onset  . Cancer Sister 87       unknown type  . Cancer Brother        prostate/seed implant with Mark Wu/died Feb 09, 2014  of heart attack      Objective:  Blood pressure (!) 169/76, pulse 65, height 5\' 9"  (1.753 m), weight 168 lb 9.6 oz (76.5 kg), SpO2 99 %. General: No acute distress.  Patient appears well-groomed.   Head:  Normocephalic/atraumatic Eyes:  Fundi examined but not visualized Neck: supple, no paraspinal tenderness, full range of motion Heart:  Regular rate and rhythm Lungs:  Clear to auscultation bilaterally Back: No paraspinal tenderness Neurological Exam: alert and  oriented to person, place, and time. Speech fluent and not dysarthric, language intact.  Decreased vision in right eye.  Decreased hearing.  Otherwise, CN II-XII intact. Bulk and tone normal, muscle strength 5/5 throughout.  Sensation to light touch  intact.  Deep tendon reflexes 2+ throughout.  Finger to nose testing intact.  Wide-based and cautious gait.    Metta Clines, DO  CC: Mark Littler, MD

## 2021-03-08 ENCOUNTER — Encounter: Payer: Self-pay | Admitting: Neurology

## 2021-03-08 ENCOUNTER — Other Ambulatory Visit: Payer: Self-pay

## 2021-03-08 ENCOUNTER — Ambulatory Visit (INDEPENDENT_AMBULATORY_CARE_PROVIDER_SITE_OTHER): Payer: Medicare Other | Admitting: Neurology

## 2021-03-08 VITALS — BP 169/76 | HR 65 | Ht 69.0 in | Wt 168.6 lb

## 2021-03-08 DIAGNOSIS — G459 Transient cerebral ischemic attack, unspecified: Secondary | ICD-10-CM

## 2021-03-08 DIAGNOSIS — I10 Essential (primary) hypertension: Secondary | ICD-10-CM

## 2021-03-08 DIAGNOSIS — I4891 Unspecified atrial fibrillation: Secondary | ICD-10-CM | POA: Diagnosis not present

## 2021-03-15 ENCOUNTER — Other Ambulatory Visit: Payer: Self-pay | Admitting: Family Medicine

## 2021-03-18 ENCOUNTER — Other Ambulatory Visit: Payer: Self-pay

## 2021-03-18 ENCOUNTER — Ambulatory Visit (INDEPENDENT_AMBULATORY_CARE_PROVIDER_SITE_OTHER): Payer: Medicare Other | Admitting: Ophthalmology

## 2021-03-18 ENCOUNTER — Encounter (INDEPENDENT_AMBULATORY_CARE_PROVIDER_SITE_OTHER): Payer: Self-pay | Admitting: Ophthalmology

## 2021-03-18 DIAGNOSIS — H353212 Exudative age-related macular degeneration, right eye, with inactive choroidal neovascularization: Secondary | ICD-10-CM

## 2021-03-18 DIAGNOSIS — H353221 Exudative age-related macular degeneration, left eye, with active choroidal neovascularization: Secondary | ICD-10-CM

## 2021-03-18 MED ORDER — BEVACIZUMAB 2.5 MG/0.1ML IZ SOSY
2.5000 mg | PREFILLED_SYRINGE | INTRAVITREAL | Status: AC | PRN
  Administered 2021-03-18: 2.5 mg via INTRAVITREAL

## 2021-03-18 NOTE — Assessment & Plan Note (Signed)
Diffuse atrophy accompanies this accounting for acuity OD

## 2021-03-18 NOTE — Assessment & Plan Note (Signed)
The nature of wet macular degeneration was discussed with the patient.  Forms of therapy reviewed include the use of Anti-VEGF medications injected painlessly into the eye, as well as other possible treatment modalities, including thermal laser therapy. Fellow eye involvement and risks were discussed with the patient. Upon the finding of wet age related macular degeneration, treatment will be offered. The treatment regimen is on a treat as needed basis with the intent to treat if necessary and extend interval of exams when possible. On average 1 out of 6 patients do not need lifetime therapy. However, the risk of recurrent disease is high for a lifetime.  Initially monthly, then periodic, examinations and evaluations will determine whether the next treatment is required on the day of the examination.  OS chronic active disease in fact chronic white disciform scar subfoveal yet with active edges with persistent dots of subretinal hemorrhage and intraretinal fluid seen on OCT.  Repeat injection today intravitreal Avastin to maintain this monocular visual functioning patient with the left eye.  RV 5 to 6 weeks

## 2021-03-18 NOTE — Progress Notes (Signed)
03/18/2021     CHIEF COMPLAINT Patient presents for Retina Follow Up (6 week fu OS and Avastin OS/Pt states VA OU stable since last visit. Pt denies FOL, floaters, or ocular pain OU. /Pt reports using Latanoprost QHS OS//)   HISTORY OF PRESENT ILLNESS: Mark Wu is a 87 y.o. male who presents to the clinic today for:   HPI    Retina Follow Up    Diagnosis: Wet AMD   Laterality: left eye   Onset: 6 weeks ago   Severity: mild   Duration: 6 weeks   Course: stable   Comments: 6 week fu OS and Avastin OS Pt states VA OU stable since last visit. Pt denies FOL, floaters, or ocular pain OU.  Pt reports using Latanoprost QHS OS         Last edited by Kendra Opitz, COA on 03/18/2021  9:18 AM. (History)      Referring physician: [name], MD [place],  [place] [id number]  HISTORICAL INFORMATION:   Selected notes from the MEDICAL RECORD NUMBER       CURRENT MEDICATIONS: Current Outpatient Medications (Ophthalmic Drugs)  Medication Sig  . latanoprost (XALATAN) 0.005 % ophthalmic solution Place 1 drop into the left eye at bedtime. (Patient not taking: Reported on 03/08/2021)   No current facility-administered medications for this visit. (Ophthalmic Drugs)   Current Outpatient Medications (Other)  Medication Sig  . amLODipine (NORVASC) 5 MG tablet TAKE 1 TABLET DAILY (NEED PHYSICAL)  . Calcium Carb-Cholecalciferol (CALCIUM CARBONATE-VITAMIN D3 PO) Take by mouth.  . clotrimazole-betamethasone (LOTRISONE) cream Apply topically as needed. (Patient not taking: Reported on 03/08/2021)  . tamsulosin (FLOMAX) 0.4 MG CAPS capsule Take 1 capsule (0.4 mg total) daily by mouth. (Patient not taking: Reported on 03/08/2021)  . warfarin (COUMADIN) 2.5 MG tablet TAKE 1 TABLET DAILY EXCEPT TAKE 2 TABLETS ON TUESDAY OR TAKE AS DIRECTED BY ANTICOAGULATION CLINIC   No current facility-administered medications for this visit. (Other)      REVIEW OF  SYSTEMS:    ALLERGIES No Known Allergies  PAST MEDICAL HISTORY Past Medical History:  Diagnosis Date  . Anal fissure 06/11/2009  . Atrial fibrillation ([place]) 06/11/2009  . HYPERTENSION 06/11/2009  . NECK MASS 10/01/2010  . Prostate cancer [facility])    Past Surgical History:  Procedure Laterality Date  . CARPAL TUNNEL RELEASE     Both hands  . CYST REMOVAL HAND     Left hand  . CYSTOSCOPY  2000   hand  . PROSTATE BIOPSY      FAMILY HISTORY Family History  Problem Relation Age of Onset  . Cancer Sister 67       unknown type  . Cancer Brother        prostate/seed implant with [name]/died 01-30-14 of heart attack    SOCIAL HISTORY Social History   Tobacco Use  . Smoking status: Former Smoker    Years: 50.00    Types: Cigars    Quit date: 08/08/1991    Years since quitting: 29.6  . Smokeless tobacco: Never Used  Vaping Use  . Vaping Use: Never used  Substance Use Topics  . Alcohol use: No  . Drug use: No         OPHTHALMIC EXAM:  Base Eye Exam    Visual Acuity (ETDRS)      Right Left   Dist cc CF at 3' 20/40   Dist ph cc  20/30 -1  Tonometry (Tonopen, 9:22 AM)      Right Left   Pressure 11 11       Pupils      Pupils Dark Light Shape React APD   Right PERRL 5 4 Round Brisk None   Left PERRL 5 4 Round Brisk None       Visual Fields (Counting fingers)      Left Right    Full Full       Extraocular Movement      Right Left    Full Full       Neuro/Psych    Oriented x3: Yes   Mood/Affect: Normal       Dilation    Left eye: 1.0% Mydriacyl, 2.5% Phenylephrine @ 9:22 AM        Slit Lamp and Fundus Exam    External Exam      Right Left   External Normal Normal       Slit Lamp Exam      Right Left   Lids/Lashes Normal Normal   Conjunctiva/Sclera White and quiet White and quiet   Cornea Clear Clear   Anterior Chamber Deep and quiet Deep and quiet   Iris Round and reactive Round and reactive   Lens Centered posterior chamber  intraocular lens Centered posterior chamber intraocular lens   Anterior Vitreous Normal Normal       Fundus Exam      Right Left   Posterior Vitreous  Posterior vitreous detachment, Central vitreous floaters   Disc  Peripapillary atrophy   C/D Ratio  0.15   Macula  Less yet persistent subretinal hemorrhage small and superior to the fovea,,, Disciform scar with large mature choroidal vessels as the new blood supply to the macula, stable for years, Retinal pigment epithelial atrophy, Geographic atrophy   Vessels  Normal   Periphery  Normal          IMAGING AND PROCEDURES  Imaging and Procedures for 03/18/21  OCT, Retina - OU - Both Eyes       Right Eye Quality was good. Scan locations included subfoveal. Central Foveal Thickness: 346. Progression has been stable. Findings include abnormal foveal contour, subretinal hyper-reflective material, subretinal scarring.   Left Eye Quality was good. Scan locations included subfoveal. Central Foveal Thickness: 323. Progression has improved. Findings include subretinal scarring, choroidal neovascular membrane, disciform scar, abnormal foveal contour, no SRF, no IRF.   Notes OS, stabilize lesion nasal to the fovea, chronically active and yet only stable at 5-6-week  follow-up interval.  Repeat in Avastin OS today  OD with massive permanent disciform fibrotic scar, stable overall       Intravitreal Injection, Pharmacologic Agent - OS - Left Eye       Time Out 03/18/2021. 9:57 AM. Confirmed correct patient, procedure, site, and patient consented.   Anesthesia Topical anesthesia was used. Anesthetic medications included Akten 3.5%.   Procedure Preparation included Tobramycin 0.3%, 10% betadine to eyelids, 5% betadine to ocular surface, Ofloxacin . A 30 gauge needle was used.   Injection:  2.5 mg Bevacizumab (AVASTIN) 2.5mg /0.46mL SOSY   NDC: 00174-944-96, Lot: [id number]   Route: Intravitreal, Site: Left Eye  Post-op Post injection  exam found visual acuity of at least counting fingers. The patient tolerated the procedure well. There were no complications. The patient received written and verbal post procedure care education. Post injection medications were not given.                 ASSESSMENT/PLAN:  Exudative age-related macular degeneration of left eye with active choroidal neovascularization (HCC) The nature of wet macular degeneration was discussed with the patient.  Forms of therapy reviewed include the use of Anti-VEGF medications injected painlessly into the eye, as well as other possible treatment modalities, including thermal laser therapy. Fellow eye involvement and risks were discussed with the patient. Upon the finding of wet age related macular degeneration, treatment will be offered. The treatment regimen is on a treat as needed basis with the intent to treat if necessary and extend interval of exams when possible. On average 1 out of 6 patients do not need lifetime therapy. However, the risk of recurrent disease is high for a lifetime.  Initially monthly, then periodic, examinations and evaluations will determine whether the next treatment is required on the day of the examination.  OS chronic active disease in fact chronic white disciform scar subfoveal yet with active edges with persistent dots of subretinal hemorrhage and intraretinal fluid seen on OCT.  Repeat injection today intravitreal Avastin to maintain this monocular visual functioning patient with the left eye.  RV 5 to 6 weeks  Exudative age-related macular degeneration of right eye with inactive choroidal neovascularization (HCC) Diffuse atrophy accompanies this accounting for acuity OD      ICD-10-CM   1. Exudative age-related macular degeneration of left eye with active choroidal neovascularization (HCC)  H35.3221 OCT, Retina - OU - Both Eyes    Intravitreal Injection, Pharmacologic Agent - OS - Left Eye    bevacizumab (AVASTIN) SOSY 2.5 mg   2. Exudative age-related macular degeneration of right eye with inactive choroidal neovascularization ([place])  H35.3212     1.  Chronic active subfoveal disciform scar left eye, repeat injection today at current interval of 6.2 weeks.  Repeat Avastin.  2.  Dilate OU next  3.  Ophthalmic Meds Ordered this visit:  Meds ordered this encounter  Medications  . bevacizumab (AVASTIN) SOSY 2.5 mg       Return for RV 5 to 6 weeks, DILATE OU, AVASTIN OCT, OS.  There are no Patient Instructions on file for this visit.   Explained the diagnoses, plan, and follow up with the patient and they expressed understanding.  Patient expressed understanding of the importance of proper follow up care.   Clent Demark Arvel Oquinn M.D. Diseases & Surgery of the Retina and Vitreous Retina & Diabetic [place] 03/18/21     Abbreviations: M myopia (nearsighted); A astigmatism; H hyperopia (farsighted); P presbyopia; Mrx spectacle prescription;  CTL contact lenses; OD right eye; OS left eye; OU both eyes  XT exotropia; ET esotropia; PEK punctate epithelial keratitis; PEE punctate epithelial erosions; DES dry eye syndrome; MGD meibomian gland dysfunction; ATs artificial tears; PFAT's preservative free artificial tears; [place] nuclear sclerotic cataract; PSC posterior subcapsular cataract; ERM epi-retinal membrane; PVD posterior vitreous detachment; RD retinal detachment; DM diabetes mellitus; DR diabetic retinopathy; NPDR non-proliferative diabetic retinopathy; PDR proliferative diabetic retinopathy; CSME clinically significant macular edema; DME diabetic macular edema; dbh dot blot hemorrhages; CWS cotton wool spot; POAG primary open angle glaucoma; C/D cup-to-disc ratio; HVF humphrey visual field; GVF goldmann visual field; OCT optical coherence tomography; IOP intraocular pressure; BRVO Branch retinal vein occlusion; CRVO central retinal vein occlusion; CRAO central retinal artery occlusion; BRAO branch retinal artery  occlusion; RT retinal tear; SB scleral buckle; PPV pars plana vitrectomy; VH Vitreous hemorrhage; PRP panretinal laser photocoagulation; IVK intravitreal kenalog; VMT vitreomacular traction; MH Macular hole;  NVD neovascularization of the disc; NVE neovascularization elsewhere; AREDS  age related eye disease study; ARMD age related macular degeneration; POAG primary open angle glaucoma; EBMD epithelial/anterior basement membrane dystrophy; ACIOL anterior chamber intraocular lens; IOL intraocular lens; PCIOL posterior chamber intraocular lens; Phaco/IOL phacoemulsification with intraocular lens placement; [place] photorefractive keratectomy; LASIK laser assisted in situ keratomileusis; HTN hypertension; DM diabetes mellitus; COPD chronic obstructive pulmonary disease

## 2021-04-08 DIAGNOSIS — Z9229 Personal history of other drug therapy: Secondary | ICD-10-CM | POA: Diagnosis not present

## 2021-04-08 DIAGNOSIS — M8589 Other specified disorders of bone density and structure, multiple sites: Secondary | ICD-10-CM | POA: Diagnosis not present

## 2021-04-08 DIAGNOSIS — C61 Malignant neoplasm of prostate: Secondary | ICD-10-CM | POA: Diagnosis not present

## 2021-04-12 ENCOUNTER — Other Ambulatory Visit: Payer: Self-pay

## 2021-04-12 ENCOUNTER — Ambulatory Visit (INDEPENDENT_AMBULATORY_CARE_PROVIDER_SITE_OTHER): Payer: Medicare Other | Admitting: General Practice

## 2021-04-12 DIAGNOSIS — I4891 Unspecified atrial fibrillation: Secondary | ICD-10-CM | POA: Diagnosis not present

## 2021-04-12 DIAGNOSIS — Z7901 Long term (current) use of anticoagulants: Secondary | ICD-10-CM

## 2021-04-12 LAB — POCT INR: INR: 2.3 (ref 2.0–3.0)

## 2021-04-12 NOTE — Patient Instructions (Signed)
Pre visit review using our clinic review tool, if applicable. No additional management support is needed unless otherwise documented below in the visit note.  Continue to take 1 tablet daily except 2 tablets every Monday. Re-check in 4 to 6 weeks.

## 2021-04-15 ENCOUNTER — Telehealth: Payer: Self-pay | Admitting: Family Medicine

## 2021-04-15 NOTE — Telephone Encounter (Signed)
Left message for patient to call back and schedule Medicare Annual Wellness Visit (AWV) either virtually or in office.   Last AWV 12/19/17  please schedule at anytime with [facility] Nurse Health Advisor 1 or 2   This should be a 45 minute visit.

## 2021-04-22 ENCOUNTER — Encounter (INDEPENDENT_AMBULATORY_CARE_PROVIDER_SITE_OTHER): Payer: Self-pay | Admitting: Ophthalmology

## 2021-04-22 ENCOUNTER — Other Ambulatory Visit: Payer: Self-pay

## 2021-04-22 ENCOUNTER — Ambulatory Visit (INDEPENDENT_AMBULATORY_CARE_PROVIDER_SITE_OTHER): Payer: Medicare Other | Admitting: Ophthalmology

## 2021-04-22 DIAGNOSIS — H353212 Exudative age-related macular degeneration, right eye, with inactive choroidal neovascularization: Secondary | ICD-10-CM

## 2021-04-22 DIAGNOSIS — H353114 Nonexudative age-related macular degeneration, right eye, advanced atrophic with subfoveal involvement: Secondary | ICD-10-CM | POA: Diagnosis not present

## 2021-04-22 DIAGNOSIS — H353221 Exudative age-related macular degeneration, left eye, with active choroidal neovascularization: Secondary | ICD-10-CM | POA: Diagnosis not present

## 2021-04-22 MED ORDER — BEVACIZUMAB 2.5 MG/0.1ML IZ SOSY
2.5000 mg | PREFILLED_SYRINGE | INTRAVITREAL | Status: AC | PRN
  Administered 2021-04-22: 2.5 mg via INTRAVITREAL

## 2021-04-22 NOTE — Assessment & Plan Note (Signed)
No signs of active CNVM OD

## 2021-04-22 NOTE — Assessment & Plan Note (Signed)
3-4 disc areas in size centrally accounts for acuity OD

## 2021-04-22 NOTE — Assessment & Plan Note (Addendum)
Small paramacular intraretinal and subretinal hemorrhage remains on the edge of an old disciform scar.  Yet with good acuity.  Controlled and stable at 5-week interval post Avastin.  We will  repeat Avastin today and examination next OS in 5 weeks

## 2021-04-22 NOTE — Progress Notes (Signed)
04/22/2021     CHIEF COMPLAINT Patient presents for Retina Follow Up (5 week fu OU and Avastin OS/Pt states VA OU stable since last visit. Pt denies FOL, floaters, or ocular pain OU. /Pt reports using Latanoprost QHS OS/)   HISTORY OF PRESENT ILLNESS: Mark Wu is a 87 y.o. male who presents to the clinic today for:   HPI     Retina Follow Up           Diagnosis: Wet AMD   Laterality: left eye   Onset: 5 weeks ago   Severity: mild   Duration: 5 weeks   Course: stable   Comments: 5 week fu OU and Avastin OS Pt states VA OU stable since last visit. Pt denies FOL, floaters, or ocular pain OU.  Pt reports using Latanoprost QHS OS          Comments   No change in acuity per patient since last visit      Last edited by Hurman Horn, MD on 04/22/2021 10:11 AM.      Referring physician: [name], MD [place],  [place] [address]  HISTORICAL INFORMATION:   Selected notes from the MEDICAL RECORD NUMBER       CURRENT MEDICATIONS: Current Outpatient Medications (Ophthalmic Drugs)  Medication Sig   latanoprost (XALATAN) 0.005 % ophthalmic solution Place 1 drop into the left eye at bedtime. (Patient not taking: Reported on 03/08/2021)   No current facility-administered medications for this visit. (Ophthalmic Drugs)   Current Outpatient Medications (Other)  Medication Sig   amLODipine (NORVASC) 5 MG tablet TAKE 1 TABLET DAILY (NEED PHYSICAL)   Calcium Carb-Cholecalciferol (CALCIUM CARBONATE-VITAMIN D3 PO) Take by mouth.   clotrimazole-betamethasone (LOTRISONE) cream Apply topically as needed. (Patient not taking: Reported on 03/08/2021)   tamsulosin (FLOMAX) 0.4 MG CAPS capsule Take 1 capsule (0.4 mg total) daily by mouth. (Patient not taking: Reported on 03/08/2021)   warfarin (COUMADIN) 2.5 MG tablet TAKE 1 TABLET DAILY EXCEPT TAKE 2 TABLETS ON TUESDAY OR TAKE AS DIRECTED BY ANTICOAGULATION CLINIC   No current facility-administered  medications for this visit. (Other)      REVIEW OF SYSTEMS:    ALLERGIES No Known Allergies  PAST MEDICAL HISTORY Past Medical History:  Diagnosis Date   Anal fissure 06/11/2009   Atrial fibrillation ([place]) 06/11/2009   HYPERTENSION 06/11/2009   NECK MASS 10/01/2010   Prostate cancer ([place])    Past Surgical History:  Procedure Laterality Date   CARPAL TUNNEL RELEASE     Both hands   CYST REMOVAL HAND     Left hand   CYSTOSCOPY  2000   hand   PROSTATE BIOPSY      FAMILY HISTORY Family History  Problem Relation Age of Onset   Cancer Sister 73       unknown type   Cancer Brother        prostate/seed implant with [name]/died 2014/01/12 of heart attack    SOCIAL HISTORY Social History   Tobacco Use   Smoking status: Former    Pack years: 0.00    Types: Cigars    Quit date: 08/08/1991    Years since quitting: 29.7   Smokeless tobacco: Never  Vaping Use   Vaping Use: Never used  Substance Use Topics   Alcohol use: No   Drug use: No         OPHTHALMIC EXAM:  Base Eye Exam     Visual Acuity (ETDRS)  Right Left   Dist cc CF at 3' 20/40   Dist ph cc  20/30         Tonometry (Tonopen, 9:35 AM)       Right Left   Pressure 15 17         Pupils       Pupils Dark Light Shape React APD   Right PERRL 5 4 Round Brisk None   Left PERRL 5 4 Round Brisk None         Visual Fields (Counting fingers)       Left Right    Full Full         Extraocular Movement       Right Left    Full Full         Neuro/Psych     Oriented x3: Yes   Mood/Affect: Normal         Dilation     Both eyes: 1.0% Mydriacyl, 2.5% Phenylephrine @ 9:35 AM           Slit Lamp and Fundus Exam     External Exam       Right Left   External Normal Normal         Slit Lamp Exam       Right Left   Lids/Lashes Normal Normal   Conjunctiva/Sclera White and quiet White and quiet   Cornea Clear Clear   Anterior Chamber Deep and quiet Deep and quiet    Iris Round and reactive Round and reactive   Lens Centered posterior chamber intraocular lens Centered posterior chamber intraocular lens   Anterior Vitreous Normal Normal         Fundus Exam       Right Left   Posterior Vitreous Posterior vitreous detachment Posterior vitreous detachment, Central vitreous floaters   Disc Normal Peripapillary atrophy   C/D Ratio 0.2 0.15   Macula Hard drusen, Geographic atrophy, Disciform scar Less yet persistent subretinal hemorrhage small and superior to the fovea,,, Disciform scar with large mature choroidal vessels as the new blood supply to the macula, stable for years, Retinal pigment epithelial atrophy, Geographic atrophy   Vessels Normal Normal   Periphery Normal Normal            IMAGING AND PROCEDURES  Imaging and Procedures for 04/22/21  OCT, Retina - OU - Both Eyes       Right Eye Quality was good. Scan locations included subfoveal. Central Foveal Thickness: 310. Progression has been stable. Findings include abnormal foveal contour, subretinal hyper-reflective material, subretinal scarring.   Left Eye Quality was borderline. Scan locations included subfoveal. Central Foveal Thickness: 348. Progression has been stable. Findings include subretinal scarring, choroidal neovascular membrane, disciform scar, abnormal foveal contour, no SRF, no IRF.   Notes OS, stabilize lesion nasal to the fovea, chronically active and yet only stable at 5-6-week  follow-up interval.  Repeat in Avastin OS today computer-generated baseline accounts for increased thickness as measured automatically to 346 however, subjective evaluation of the slices through the macula show that there is no increase in size of intraretinal or subretinal fluid  OD with massive permanent disciform fibrotic scar, stable overall     Intravitreal Injection, Pharmacologic Agent - OS - Left Eye       Time Out 04/22/2021. 10:14 AM. Confirmed correct patient, procedure, site,  and patient consented.   Anesthesia Topical anesthesia was used. Anesthetic medications included Akten 3.5%.   Procedure Preparation included Tobramycin 0.3%, 10% betadine to  eyelids, 5% betadine to ocular surface, Ofloxacin . A 30 gauge needle was used.   Injection: 2.5 mg bevacizumab 2.5 MG/0.1ML   Route: Intravitreal, Site: Left Eye   NDC: [phone], Lot: [id number]   Post-op Post injection exam found visual acuity of at least counting fingers. The patient tolerated the procedure well. There were no complications. The patient received written and verbal post procedure care education. Post injection medications were not given.              ASSESSMENT/PLAN:  Exudative age-related macular degeneration of left eye with active choroidal neovascularization (HCC) Small paramacular intraretinal and subretinal hemorrhage remains on the edge of an old disciform scar.  Yet with good acuity.  Controlled and stable at 5-week interval post Avastin.  We will  repeat Avastin today and examination next OS in 5 weeks  Exudative age-related macular degeneration of right eye with inactive choroidal neovascularization (HCC) No signs of active CNVM OD  Nonexudative age-related macular degeneration, right eye, advanced atrophic with subfoveal involvement 3-4 disc areas in size centrally accounts for acuity OD     ICD-10-CM   1. Exudative age-related macular degeneration of left eye with active choroidal neovascularization (HCC)  H35.3221 OCT, Retina - OU - Both Eyes    Intravitreal Injection, Pharmacologic Agent - OS - Left Eye    bevacizumab (AVASTIN) SOSY 2.5 mg    2. Exudative age-related macular degeneration of right eye with inactive choroidal neovascularization (HCC)  H35.3212 OCT, Retina - OU - Both Eyes    3. Nonexudative age-related macular degeneration, right eye, advanced atrophic with subfoveal involvement  H35.3114 OCT, Retina - OU - Both Eyes      1.  OD, geographic atrophy  limits acuity  2.  OS, subfoveal disc a form scar much less active today yet still with clinical finding of small subretinal hemorrhage at 5-week interval post Avastin.  With good acuity will obtain repeat Avastin today  3.  Ophthalmic Meds Ordered this visit:  Meds ordered this encounter  Medications   bevacizumab (AVASTIN) SOSY 2.5 mg       Return in about 5 weeks (around 05/27/2021) for dilate, OS, AVASTIN OCT.  There are no Patient Instructions on file for this visit.   Explained the diagnoses, plan, and follow up with the patient and they expressed understanding.  Patient expressed understanding of the importance of proper follow up care.   Clent Demark Travis Purk M.D. Diseases & Surgery of the Retina and Vitreous Retina & Diabetic [place] 04/22/21     Abbreviations: M myopia (nearsighted); A astigmatism; H hyperopia (farsighted); P presbyopia; Mrx spectacle prescription;  CTL contact lenses; OD right eye; OS left eye; OU both eyes  XT exotropia; ET esotropia; PEK punctate epithelial keratitis; PEE punctate epithelial erosions; DES dry eye syndrome; MGD meibomian gland dysfunction; ATs artificial tears; PFAT's preservative free artificial tears; [place] nuclear sclerotic cataract; PSC posterior subcapsular cataract; ERM epi-retinal membrane; PVD posterior vitreous detachment; RD retinal detachment; DM diabetes mellitus; DR diabetic retinopathy; NPDR non-proliferative diabetic retinopathy; PDR proliferative diabetic retinopathy; CSME clinically significant macular edema; DME diabetic macular edema; dbh dot blot hemorrhages; CWS cotton wool spot; POAG primary open angle glaucoma; C/D cup-to-disc ratio; HVF humphrey visual field; GVF goldmann visual field; OCT optical coherence tomography; IOP intraocular pressure; BRVO Branch retinal vein occlusion; CRVO central retinal vein occlusion; CRAO central retinal artery occlusion; BRAO branch retinal artery occlusion; RT retinal tear; SB scleral  buckle; PPV pars plana vitrectomy; VH Vitreous hemorrhage; PRP panretinal  laser photocoagulation; IVK intravitreal kenalog; VMT vitreomacular traction; MH Macular hole;  NVD neovascularization of the disc; NVE neovascularization elsewhere; AREDS age related eye disease study; ARMD age related macular degeneration; POAG primary open angle glaucoma; EBMD epithelial/anterior basement membrane dystrophy; ACIOL anterior chamber intraocular lens; IOL intraocular lens; PCIOL posterior chamber intraocular lens; Phaco/IOL phacoemulsification with intraocular lens placement; [place] photorefractive keratectomy; LASIK laser assisted in situ keratomileusis; HTN hypertension; DM diabetes mellitus; COPD chronic obstructive pulmonary disease

## 2021-04-28 DIAGNOSIS — M85852 Other specified disorders of bone density and structure, left thigh: Secondary | ICD-10-CM | POA: Diagnosis not present

## 2021-04-28 DIAGNOSIS — M810 Age-related osteoporosis without current pathological fracture: Secondary | ICD-10-CM | POA: Diagnosis not present

## 2021-04-29 ENCOUNTER — Encounter (INDEPENDENT_AMBULATORY_CARE_PROVIDER_SITE_OTHER): Payer: Medicare Other | Admitting: Ophthalmology

## 2021-05-24 ENCOUNTER — Other Ambulatory Visit: Payer: Self-pay

## 2021-05-24 ENCOUNTER — Ambulatory Visit (INDEPENDENT_AMBULATORY_CARE_PROVIDER_SITE_OTHER): Payer: Medicare Other | Admitting: General Practice

## 2021-05-24 ENCOUNTER — Ambulatory Visit: Payer: Medicare Other

## 2021-05-24 DIAGNOSIS — Z7901 Long term (current) use of anticoagulants: Secondary | ICD-10-CM

## 2021-05-24 LAB — POCT INR: INR: 2.2 (ref 2.0–3.0)

## 2021-05-24 NOTE — Patient Instructions (Addendum)
Pre visit review using our clinic review tool, if applicable. No additional management support is needed unless otherwise documented below in the visit note.  Continue to take 1 tablet daily except 2 tablets every Monday. Re-check in 4 to 6 weeks.

## 2021-05-27 ENCOUNTER — Ambulatory Visit (INDEPENDENT_AMBULATORY_CARE_PROVIDER_SITE_OTHER): Payer: Medicare Other | Admitting: Ophthalmology

## 2021-05-27 ENCOUNTER — Other Ambulatory Visit: Payer: Self-pay | Admitting: Family Medicine

## 2021-05-27 ENCOUNTER — Encounter (INDEPENDENT_AMBULATORY_CARE_PROVIDER_SITE_OTHER): Payer: Self-pay | Admitting: Ophthalmology

## 2021-05-27 ENCOUNTER — Other Ambulatory Visit: Payer: Self-pay

## 2021-05-27 DIAGNOSIS — H353221 Exudative age-related macular degeneration, left eye, with active choroidal neovascularization: Secondary | ICD-10-CM

## 2021-05-27 DIAGNOSIS — H353212 Exudative age-related macular degeneration, right eye, with inactive choroidal neovascularization: Secondary | ICD-10-CM

## 2021-05-27 DIAGNOSIS — Z7901 Long term (current) use of anticoagulants: Secondary | ICD-10-CM

## 2021-05-27 MED ORDER — BEVACIZUMAB 2.5 MG/0.1ML IZ SOSY
2.5000 mg | PREFILLED_SYRINGE | INTRAVITREAL | Status: AC | PRN
  Administered 2021-05-27: 2.5 mg via INTRAVITREAL

## 2021-05-27 NOTE — Telephone Encounter (Signed)
Pt is compliant with coumadin management. Last PCP OV was Jan 2022. Pt is due for AWV and will be reminded at next coumadin clinic apt to schedule that.  Sent in script,.

## 2021-05-27 NOTE — Assessment & Plan Note (Signed)
Clinically active with small subretinal hemorrhage inferior to the FAZ.  Stabilized and less thickening and by OCT at  5-week interval today post Avastin.  Repeat Avastin OS today and examination next in 6 weeks

## 2021-05-27 NOTE — Progress Notes (Signed)
05/27/2021     CHIEF COMPLAINT Patient presents for Retina Follow Up (5 week fu OU and Avastin OS/Pt states VA OU stable since last visit. Pt denies FOL, floaters, or ocular pain OU. /Pt reports using Latanoprost QHS OS/)   HISTORY OF PRESENT ILLNESS: Mark Wu is a 87 y.o. male who presents to the clinic today for:   HPI     Retina Follow Up           Diagnosis: Wet AMD   Laterality: left eye   Onset: 5 weeks ago   Severity: mild   Duration: 5 weeks   Course: stable   Comments: 5 week fu OU and Avastin OS Pt states VA OU stable since last visit. Pt denies FOL, floaters, or ocular pain OU.  Pt reports using Latanoprost QHS OS          Comments   5 wek fu os, oct avastin os Patient states vision is stable and unchanged since last visit. Denies any new floaters or FOL. Pt states he uses latanoprost OS qhs.      Last edited by Laurin Coder, COA on 05/27/2021  9:31 AM.      Referring physician: [name], MD [place] [id number]  HISTORICAL INFORMATION:   Selected notes from the MEDICAL RECORD NUMBER       CURRENT MEDICATIONS: Current Outpatient Medications (Ophthalmic Drugs)  Medication Sig   latanoprost (XALATAN) 0.005 % ophthalmic solution Place 1 drop into the left eye at bedtime. (Patient not taking: Reported on 03/08/2021)   No current facility-administered medications for this visit. (Ophthalmic Drugs)   Current Outpatient Medications (Other)  Medication Sig   amLODipine (NORVASC) 5 MG tablet TAKE 1 TABLET DAILY (NEED PHYSICAL)   Calcium Carb-Cholecalciferol (CALCIUM CARBONATE-VITAMIN D3 PO) Take by mouth.   clotrimazole-betamethasone (LOTRISONE) cream Apply topically as needed. (Patient not taking: Reported on 03/08/2021)   tamsulosin (FLOMAX) 0.4 MG CAPS capsule Take 1 capsule (0.4 mg total) daily by mouth. (Patient not taking: Reported on 03/08/2021)   warfarin (COUMADIN) 2.5 MG tablet TAKE 1 TABLET DAILY  EXCEPT TAKE 2 TABLETS ON TUESDAY OR TAKE AS DIRECTED BY ANTICOAGULATION CLINIC   No current facility-administered medications for this visit. (Other)      REVIEW OF SYSTEMS:    ALLERGIES No Known Allergies  PAST MEDICAL HISTORY Past Medical History:  Diagnosis Date   Anal fissure 06/11/2009   Atrial fibrillation ([place]) 06/11/2009   HYPERTENSION 06/11/2009   NECK MASS 10/01/2010   Prostate cancer ([place])    Past Surgical History:  Procedure Laterality Date   CARPAL TUNNEL RELEASE     Both hands   CYST REMOVAL HAND     Left hand   CYSTOSCOPY  2000   hand   PROSTATE BIOPSY      FAMILY HISTORY Family History  Problem Relation Age of Onset   Cancer Sister 45       unknown type   Cancer Brother        prostate/seed implant with [name]/died 01-06-2014 of heart attack    SOCIAL HISTORY Social History   Tobacco Use   Smoking status: Former    Types: Cigars    Quit date: 08/08/1991    Years since quitting: 29.8   Smokeless tobacco: Never  Vaping Use   Vaping Use: Never used  Substance Use Topics   Alcohol use: No   Drug use: No  OPHTHALMIC EXAM:  Base Eye Exam     Visual Acuity (ETDRS)       Right Left   Dist cc CF at 3' 20/40 -2         Tonometry (Tonopen, 9:36 AM)       Right Left   Pressure 12 11         Pupils       Pupils Dark Light Shape React APD   Right PERRL 5 4 Round Brisk None   Left PERRL 5 4 Round Brisk None         Visual Fields (Counting fingers)       Left Right    Full    Restrictions  Central scotoma         Extraocular Movement       Right Left    Full Full         Neuro/Psych     Oriented x3: Yes   Mood/Affect: Normal         Dilation     Left eye: 1.0% Mydriacyl, 2.5% Phenylephrine @ 9:36 AM           Slit Lamp and Fundus Exam     External Exam       Right Left   External Normal Normal         Slit Lamp Exam       Right Left   Lids/Lashes Normal Normal   Conjunctiva/Sclera  White and quiet White and quiet   Cornea Clear Clear   Anterior Chamber Deep and quiet Deep and quiet   Iris Round and reactive Round and reactive   Lens Centered posterior chamber intraocular lens Centered posterior chamber intraocular lens   Anterior Vitreous Normal Normal         Fundus Exam       Right Left   Posterior Vitreous  Posterior vitreous detachment, Central vitreous floaters   Disc  Peripapillary atrophy   C/D Ratio  0.15   Macula  Less yet persistent subretinal hemorrhage small and inferior  to the fovea,,, Disciform scar with large mature choroidal vessels as the new blood supply to the macula, stable for years, Retinal pigment epithelial atrophy, Geographic atrophy   Vessels  Normal   Periphery  Normal            IMAGING AND PROCEDURES      05/27/2021     CHIEF COMPLAINT Patient presents for Retina Follow Up (5 week fu OU and Avastin OS/Pt states VA OU stable since last visit. Pt denies FOL, floaters, or ocular pain OU. /Pt reports using Latanoprost QHS OS/)   HISTORY OF PRESENT ILLNESS: Mark Wu is a 87 y.o. male who presents to the clinic today for:   HPI     Retina Follow Up           Diagnosis: Wet AMD   Laterality: left eye   Onset: 5 weeks ago   Severity: mild   Duration: 5 weeks   Course: stable   Comments: 5 week fu OU and Avastin OS Pt states VA OU stable since last visit. Pt denies FOL, floaters, or ocular pain OU.  Pt reports using Latanoprost QHS OS          Comments   5 wek fu os, oct avastin os Patient states vision is stable and unchanged since last visit. Denies any new floaters or FOL. Pt states he uses latanoprost OS qhs.      Last  edited by Laurin Coder, COA on 05/27/2021  9:31 AM.      Referring physician: [name], MD [place],  [place] [id number]  HISTORICAL INFORMATION:   Selected notes from the MEDICAL RECORD NUMBER       CURRENT MEDICATIONS: Current Outpatient  Medications (Ophthalmic Drugs)  Medication Sig   latanoprost (XALATAN) 0.005 % ophthalmic solution Place 1 drop into the left eye at bedtime. (Patient not taking: Reported on 03/08/2021)   No current facility-administered medications for this visit. (Ophthalmic Drugs)   Current Outpatient Medications (Other)  Medication Sig   amLODipine (NORVASC) 5 MG tablet TAKE 1 TABLET DAILY (NEED PHYSICAL)   Calcium Carb-Cholecalciferol (CALCIUM CARBONATE-VITAMIN D3 PO) Take by mouth.   clotrimazole-betamethasone (LOTRISONE) cream Apply topically as needed. (Patient not taking: Reported on 03/08/2021)   tamsulosin (FLOMAX) 0.4 MG CAPS capsule Take 1 capsule (0.4 mg total) daily by mouth. (Patient not taking: Reported on 03/08/2021)   warfarin (COUMADIN) 2.5 MG tablet TAKE 1 TABLET DAILY EXCEPT TAKE 2 TABLETS ON TUESDAY OR TAKE AS DIRECTED BY ANTICOAGULATION CLINIC   No current facility-administered medications for this visit. (Other)      REVIEW OF SYSTEMS:    ALLERGIES No Known Allergies  PAST MEDICAL HISTORY Past Medical History:  Diagnosis Date   Anal fissure 06/11/2009   Atrial fibrillation ([place]) 06/11/2009   HYPERTENSION 06/11/2009   NECK MASS 10/01/2010   Prostate cancer ([place])    Past Surgical History:  Procedure Laterality Date   CARPAL TUNNEL RELEASE     Both hands   CYST REMOVAL HAND     Left hand   CYSTOSCOPY  01-24-1999   hand   PROSTATE BIOPSY      FAMILY HISTORY Family History  Problem Relation Age of Onset   Cancer Sister 45       unknown type   Cancer Brother        prostate/seed implant with [name]/died 2014-01-23 of heart attack    SOCIAL HISTORY Social History   Tobacco Use   Smoking status: Former    Types: Cigars    Quit date: 08/08/1991    Years since quitting: 29.8   Smokeless tobacco: Never  Vaping Use   Vaping Use: Never used  Substance Use Topics   Alcohol use: No   Drug use: No         OPHTHALMIC EXAM:  Base Eye Exam     Visual Acuity (ETDRS)        Right Left   Dist cc CF at 3' 20/40 -2         Tonometry (Tonopen, 9:36 AM)       Right Left   Pressure 12 11         Pupils       Pupils Dark Light Shape React APD   Right PERRL 5 4 Round Brisk None   Left PERRL 5 4 Round Brisk None         Visual Fields (Counting fingers)       Left Right    Full    Restrictions  Central scotoma         Extraocular Movement       Right Left    Full Full         Neuro/Psych     Oriented x3: Yes   Mood/Affect: Normal         Dilation     Left eye: 1.0% Mydriacyl, 2.5% Phenylephrine @ 9:36 AM  Slit Lamp and Fundus Exam     External Exam       Right Left   External Normal Normal         Slit Lamp Exam       Right Left   Lids/Lashes Normal Normal   Conjunctiva/Sclera White and quiet White and quiet   Cornea Clear Clear   Anterior Chamber Deep and quiet Deep and quiet   Iris Round and reactive Round and reactive   Lens Centered posterior chamber intraocular lens Centered posterior chamber intraocular lens   Anterior Vitreous Normal Normal         Fundus Exam       Right Left   Posterior Vitreous  Posterior vitreous detachment, Central vitreous floaters   Disc  Peripapillary atrophy   C/D Ratio  0.15   Macula  Less yet persistent subretinal hemorrhage small and inferior  to the fovea,,, Disciform scar with large mature choroidal vessels as the new blood supply to the macula, stable for years, Retinal pigment epithelial atrophy, Geographic atrophy   Vessels  Normal   Periphery  Normal            IMAGING AND PROCEDURES  Imaging and Procedures for 05/27/21  OCT, Retina - OU - Both Eyes       Right Eye Quality was good. Scan locations included subfoveal. Central Foveal Thickness: 345. Progression has been stable. Findings include abnormal foveal contour, subretinal hyper-reflective material, subretinal scarring.   Left Eye Quality was borderline. Scan locations included  subfoveal. Central Foveal Thickness: 309. Progression has been stable. Findings include subretinal scarring, choroidal neovascular membrane, disciform scar, abnormal foveal contour, no SRF, no IRF.   Notes OS, stabilize lesion nasal to the fovea, chronically active and yet only stable at 5-6-week  follow-up interval.  OS improved today to 310 m from 346 DOS effective use of Avastin some 5 weeks previous.  Repeat today and examination again in 6 weeks  OD with massive permanent disciform fibrotic scar, stable overall     Intravitreal Injection, Pharmacologic Agent - OS - Left Eye       Time Out 05/27/2021. 10:20 AM. Confirmed correct patient, procedure, site, and patient consented.   Anesthesia Topical anesthesia was used. Anesthetic medications included Akten 3.5%.   Procedure Preparation included Tobramycin 0.3%, 10% betadine to eyelids, 5% betadine to ocular surface, Ofloxacin . A 30 gauge needle was used.   Injection: 2.5 mg bevacizumab 2.5 MG/0.1ML   Route: Intravitreal, Site: Left Eye   NDC: [phone], Lot: [id number]   Post-op Post injection exam found visual acuity of at least counting fingers. The patient tolerated the procedure well. There were no complications. The patient received written and verbal post procedure care education. Post injection medications were not given.              ASSESSMENT/PLAN:  Exudative age-related macular degeneration of left eye with active choroidal neovascularization (HCC) Clinically active with small subretinal hemorrhage inferior to the FAZ.  Stabilized and less thickening and by OCT at  5-week interval today post Avastin.  Repeat Avastin OS today and examination next in 6 weeks  Exudative age-related macular degeneration of right eye with inactive choroidal neovascularization (HCC) Subfoveal disciform scar stable      ICD-10-CM   1. Exudative age-related macular degeneration of left eye with active choroidal  neovascularization (HCC)  H35.3221 OCT, Retina - OU - Both Eyes    Intravitreal Injection, Pharmacologic Agent - OS - Left Eye  bevacizumab (AVASTIN) SOSY 2.5 mg    2. Exudative age-related macular degeneration of right eye with inactive choroidal neovascularization ([place])  H35.3212       1.  OS clinically improved and by OCT as well as still active CNVM with this monocular patient with preserved acuity.  Repeat intravitreal Avastin OS today and follow-up examination next in 6 weeks  2.  3.  Ophthalmic Meds Ordered this visit:  Meds ordered this encounter  Medications   bevacizumab (AVASTIN) SOSY 2.5 mg       Return in about 6 weeks (around 07/08/2021) for DILATE OU, AVASTIN OCT, OS.  There are no Patient Instructions on file for this visit.   Explained the diagnoses, plan, and follow up with the patient and they expressed understanding.  Patient expressed understanding of the importance of proper follow up care.   Mark Wu M.D. Diseases & Surgery of the Retina and Vitreous Retina & Diabetic [place] 05/27/21     Abbreviations: M myopia (nearsighted); A astigmatism; H hyperopia (farsighted); P presbyopia; Mrx spectacle prescription;  CTL contact lenses; OD right eye; OS left eye; OU both eyes  XT exotropia; ET esotropia; PEK punctate epithelial keratitis; PEE punctate epithelial erosions; DES dry eye syndrome; MGD meibomian gland dysfunction; ATs artificial tears; PFAT's preservative free artificial tears; [place] nuclear sclerotic cataract; PSC posterior subcapsular cataract; ERM epi-retinal membrane; PVD posterior vitreous detachment; RD retinal detachment; DM diabetes mellitus; DR diabetic retinopathy; NPDR non-proliferative diabetic retinopathy; PDR proliferative diabetic retinopathy; CSME clinically significant macular edema; DME diabetic macular edema; dbh dot blot hemorrhages; CWS cotton wool spot; POAG primary open angle glaucoma; C/D cup-to-disc ratio; HVF humphrey  visual field; GVF goldmann visual field; OCT optical coherence tomography; IOP intraocular pressure; BRVO Branch retinal vein occlusion; CRVO central retinal vein occlusion; CRAO central retinal artery occlusion; BRAO branch retinal artery occlusion; RT retinal tear; SB scleral buckle; PPV pars plana vitrectomy; VH Vitreous hemorrhage; PRP panretinal laser photocoagulation; IVK intravitreal kenalog; VMT vitreomacular traction; MH Macular hole;  NVD neovascularization of the disc; NVE neovascularization elsewhere; AREDS age related eye disease study; ARMD age related macular degeneration; POAG primary open angle glaucoma; EBMD epithelial/anterior basement membrane dystrophy; ACIOL anterior chamber intraocular lens; IOL intraocular lens; PCIOL posterior chamber intraocular lens; Phaco/IOL phacoemulsification with intraocular lens placement; [place] photorefractive keratectomy; LASIK laser assisted in situ keratomileusis; HTN hypertension; DM diabetes mellitus; COPD chronic obstructive pulmonary disease

## 2021-05-27 NOTE — Assessment & Plan Note (Signed)
Subfoveal disciform scar stable

## 2021-06-08 DIAGNOSIS — H02035 Senile entropion of left lower eyelid: Secondary | ICD-10-CM | POA: Diagnosis not present

## 2021-06-08 DIAGNOSIS — H02032 Senile entropion of right lower eyelid: Secondary | ICD-10-CM | POA: Diagnosis not present

## 2021-06-22 ENCOUNTER — Ambulatory Visit (INDEPENDENT_AMBULATORY_CARE_PROVIDER_SITE_OTHER): Payer: Medicare Other | Admitting: Family Medicine

## 2021-06-22 ENCOUNTER — Other Ambulatory Visit: Payer: Self-pay

## 2021-06-22 ENCOUNTER — Encounter: Payer: Self-pay | Admitting: Family Medicine

## 2021-06-22 VITALS — BP 108/64 | HR 96 | Temp 97.5°F | Wt 167.0 lb

## 2021-06-22 DIAGNOSIS — Z23 Encounter for immunization: Secondary | ICD-10-CM | POA: Diagnosis not present

## 2021-06-22 DIAGNOSIS — I4891 Unspecified atrial fibrillation: Secondary | ICD-10-CM

## 2021-06-22 DIAGNOSIS — R21 Rash and other nonspecific skin eruption: Secondary | ICD-10-CM | POA: Diagnosis not present

## 2021-06-22 DIAGNOSIS — I10 Essential (primary) hypertension: Secondary | ICD-10-CM | POA: Diagnosis not present

## 2021-06-22 LAB — CBC WITH DIFFERENTIAL/PLATELET
Basophils Absolute: 0 K/uL (ref 0.0–0.1)
Basophils Relative: 0.3 % (ref 0.0–3.0)
Eosinophils Absolute: 0.2 K/uL (ref 0.0–0.7)
Eosinophils Relative: 2.3 % (ref 0.0–5.0)
HCT: 42.8 % (ref 39.0–52.0)
Hemoglobin: 14.3 g/dL (ref 13.0–17.0)
Lymphocytes Relative: 21.5 % (ref 12.0–46.0)
Lymphs Abs: 1.7 K/uL (ref 0.7–4.0)
MCHC: 33.4 g/dL (ref 30.0–36.0)
MCV: 88.8 fl (ref 78.0–100.0)
Monocytes Absolute: 0.8 K/uL (ref 0.1–1.0)
Monocytes Relative: 10.2 % (ref 3.0–12.0)
Neutro Abs: 5.1 K/uL (ref 1.4–7.7)
Neutrophils Relative %: 65.7 % (ref 43.0–77.0)
Platelets: 247 K/uL (ref 150.0–400.0)
RBC: 4.82 Mil/uL (ref 4.22–5.81)
RDW: 14.7 % (ref 11.5–15.5)
WBC: 7.7 K/uL (ref 4.0–10.5)

## 2021-06-22 LAB — BASIC METABOLIC PANEL
BUN: 12 mg/dL (ref 6–23)
CO2: 27 mEq/L (ref 19–32)
Calcium: 9.7 mg/dL (ref 8.4–10.5)
Chloride: 101 mEq/L (ref 96–112)
Creatinine, Ser: 0.89 mg/dL (ref 0.40–1.50)
GFR: 74.21 mL/min (ref >60.00)
Glucose, Bld: 83 mg/dL (ref 70–99)
Potassium: 3.8 mEq/L (ref 3.5–5.1)
Sodium: 138 mEq/L (ref 135–145)

## 2021-06-22 MED ORDER — CLOTRIMAZOLE-BETAMETHASONE 1-0.05 % EX CREA: TOPICAL_CREAM | CUTANEOUS | 1 refills | Status: DC

## 2021-06-22 NOTE — Patient Instructions (Signed)
Remember to consider Shingrix vaccine and check at pharmacy if interested.

## 2021-06-22 NOTE — Addendum Note (Signed)
Addended by: Anderson Malta on: 06/22/2021 05:01 PM   Modules accepted: Orders

## 2021-06-22 NOTE — Progress Notes (Signed)
Established Patient Office Visit  Subjective:  Patient ID: Mark Wu, male    DOB: June 19, 1926  Age: 87 y.o. MRN: [id number]  CC:  Chief Complaint  Patient presents with   Annual Exam    [place] 10 mos ago and dr at [facility] took off metoprolol    HPI Mark Wu presents for medical follow-up.  He has longstanding history of atrial fibrillation.  He was hospitalized last fall and had heart rate in the 40s and was taken off metoprolol.  He remains on amlodipine for hypertension.  Denies any recent falls.  He does remain on Coumadin and is followed through the Coumadin clinic.  He has history of severe bilateral hearing loss.  He has hearing aids through the [place] but still struggles to hear at times.  He has history of macular degeneration followed closely by ophthalmology.  Past history of prostate cancer.  His current medications include amlodipine and Coumadin.  He is requesting refill of Lotrisone cream which he has used intermittently for "heat rash "in the groin.  He states this works very well for recurrent rash in this region.  He had presented following a fall last year with acute right lower extremity weakness.  MRI brain and CT head showed no acute abnormality.  He ended up getting CT angiogram of the head and neck which showed no significant ICA stenosis.  He was seen and followed by neurology and felt to have probably had TIA.  He remains on regular Coumadin.  Ambulates with a cane at this time.  He has good appetite.  He remains active with mowing his 1 acre yard and also has about 2 to 3 acres that he bush hogs.  Needs flu vaccine.  Has not had shingles vaccine.  Previous pneumonia vaccines complete.  Past Medical History:  Diagnosis Date   Anal fissure 06/11/2009   Atrial fibrillation ([place]) 06/11/2009   HYPERTENSION 06/11/2009   NECK MASS 10/01/2010   Prostate cancer ([place])     Past Surgical History:  Procedure Laterality Date   CARPAL TUNNEL RELEASE     Both hands    CYST REMOVAL HAND     Left hand   CYSTOSCOPY  2000   hand   PROSTATE BIOPSY      Family History  Problem Relation Age of Onset   Cancer Sister 52       unknown type   Cancer Brother        prostate/seed implant with [name]/died Jan 08, 2014 of heart attack    Social History   Socioeconomic History   Marital status: Married    Spouse name: Not on file   Number of children: Not on file   Years of education: Not on file   Highest education level: Not on file  Occupational History   Occupation: retired    Comment: retired from [employer] in [place] Use   Smoking status: Former    Types: Cigars    Quit date: 08/08/1991    Years since quitting: 29.8   Smokeless tobacco: Never  Vaping Use   Vaping Use: Never used  Substance and Sexual Activity   Alcohol use: No   Drug use: No   Sexual activity: Never  Other Topics Concern   Not on file  Social History Narrative   Resides in [place] (near [place], [place])   One story home   Occasionally caffeine   Right handed   Social Determinants of Health   Financial Resource Strain: Not on  file  Food Insecurity: Not on file  Transportation Needs: Not on file  Physical Activity: Not on file  Stress: Not on file  Social Connections: Not on file  Intimate Partner Violence: Not on file    Outpatient Medications Prior to Visit  Medication Sig Dispense Refill   amLODipine (NORVASC) 5 MG tablet TAKE 1 TABLET DAILY (NEED PHYSICAL) 90 tablet 3   Calcium Carb-Cholecalciferol (CALCIUM CARBONATE-VITAMIN D3 PO) Take by mouth.     warfarin (COUMADIN) 2.5 MG tablet TAKE 1 TABLET DAILY EXCEPT TAKE 2 TABLETS ON MONDAYS OR TAKE AS DIRECTED BY ANTICOAGULATION CLINIC 120 tablet 1   latanoprost (XALATAN) 0.005 % ophthalmic solution Place 1 drop into the left eye at bedtime. (Patient not taking: No sig reported) 4.5 mL 3   clotrimazole-betamethasone (LOTRISONE) cream Apply topically as needed. (Patient not taking: No sig reported) 45 g 1   tamsulosin  (FLOMAX) 0.4 MG CAPS capsule Take 1 capsule (0.4 mg total) daily by mouth. (Patient not taking: No sig reported) 90 capsule 1   No facility-administered medications prior to visit.    No Known Allergies  ROS Review of Systems  Constitutional:  Negative for appetite change, fever and unexpected weight change.  Respiratory:  Negative for cough.   Cardiovascular:  Negative for chest pain.  Gastrointestinal:  Negative for abdominal pain.  Genitourinary:  Negative for dysuria.  Neurological:  Negative for headaches.  Psychiatric/Behavioral:  Negative for confusion.      Objective:    Physical Exam Vitals reviewed.  Constitutional:      Appearance: Normal appearance.  Cardiovascular:     Rate and Rhythm: Normal rate.  Pulmonary:     Effort: Pulmonary effort is normal.     Breath sounds: Normal breath sounds. No wheezing or rales.  Musculoskeletal:     Right lower leg: No edema.     Left lower leg: No edema.  Neurological:     Mental Status: He is alert.    BP 108/64 (BP Location: Right Arm, Patient Position: Sitting, Cuff Size: Normal)   Pulse 96   Temp (!) 97.5 F (36.4 C) (Oral)   Wt 167 lb (75.8 kg)   SpO2 (!) 61%   BMI 24.66 kg/m  Wt Readings from Last 3 Encounters:  06/22/21 167 lb (75.8 kg)  03/08/21 168 lb 9.6 oz (76.5 kg)  11/16/20 169 lb (76.7 kg)     Health Maintenance Due  Topic Date Due   TETANUS/TDAP  Never done   Zoster Vaccines- Shingrix (1 of 2) Never done   INFLUENZA VACCINE  05/17/2021   COVID-19 Vaccine (5 - Booster for Moderna series) 06/26/2021    There are no preventive care reminders to display for this patient.  Lab Results  Component Value Date   TSH 1.018 08/16/2020   Lab Results  Component Value Date   WBC 5.6 11/16/2020   HGB 13.8 11/16/2020   HCT 40.2 11/16/2020   MCV 86.6 11/16/2020   PLT 274.0 11/16/2020   Lab Results  Component Value Date   NA 143 09/07/2020   K 5.2 09/07/2020   CO2 25 09/07/2020   GLUCOSE 95  09/07/2020   BUN 12 09/07/2020   CREATININE 0.90 09/07/2020   BILITOT 0.9 04/04/2017   ALKPHOS 45 04/04/2017   AST 17 04/04/2017   ALT 12 04/04/2017   PROT 6.6 04/04/2017   ALBUMIN 4.1 04/04/2017   CALCIUM 10.1 09/07/2020   ANIONGAP 8 08/17/2020   GFR 78.45 04/04/2017   Lab Results  Component Value Date   CHOL 142 09/04/2020   Lab Results  Component Value Date   HDL 36.10 (L) 09/04/2020   Lab Results  Component Value Date   LDLCALC 83 09/04/2020   Lab Results  Component Value Date   TRIG 114.0 09/04/2020   Lab Results  Component Value Date   CHOLHDL 4 09/04/2020   No results found for: HGBA1C    Assessment & Plan:   #1 hypertension stable. -Continue amlodipine 5 mg daily -Check basic metabolic panel  #2 history of chronic atrial fibrillation.  Patient on chronic Coumadin.  Taken off amlodipine secondary to heart rate in the 40s. -Continue close follow-up with the Coumadin clinic  #3 history of recurrent groin rash.  Has responded well to Lotrisone and refills given per patient request today  #4 health maintenance -Recommend flu vaccine and he consents -We did discuss Shingrix vaccine and he will consider getting this through local pharmacy.   Meds ordered this encounter  Medications   clotrimazole-betamethasone (LOTRISONE) cream    Sig: Apply topically as needed.    Dispense:  45 g    Refill:  1    Follow-up: No follow-ups on file.    [name], MD

## 2021-07-01 DIAGNOSIS — H02032 Senile entropion of right lower eyelid: Secondary | ICD-10-CM | POA: Diagnosis not present

## 2021-07-05 ENCOUNTER — Other Ambulatory Visit: Payer: Self-pay

## 2021-07-05 ENCOUNTER — Ambulatory Visit (INDEPENDENT_AMBULATORY_CARE_PROVIDER_SITE_OTHER): Payer: Medicare Other

## 2021-07-05 DIAGNOSIS — Z7901 Long term (current) use of anticoagulants: Secondary | ICD-10-CM | POA: Diagnosis not present

## 2021-07-05 LAB — POCT INR: INR: 2.1 (ref 2.0–3.0)

## 2021-07-05 NOTE — Patient Instructions (Addendum)
Pre visit review using our clinic review tool, if applicable. No additional management support is needed unless otherwise documented below in the visit note.  Continue to take 1 tablet daily except 2 tablets every Monday. Re-check in 6 weeks.

## 2021-07-08 ENCOUNTER — Encounter (INDEPENDENT_AMBULATORY_CARE_PROVIDER_SITE_OTHER): Payer: Self-pay | Admitting: Ophthalmology

## 2021-07-08 ENCOUNTER — Ambulatory Visit (INDEPENDENT_AMBULATORY_CARE_PROVIDER_SITE_OTHER): Payer: Medicare Other | Admitting: Ophthalmology

## 2021-07-08 ENCOUNTER — Other Ambulatory Visit: Payer: Self-pay

## 2021-07-08 DIAGNOSIS — H353114 Nonexudative age-related macular degeneration, right eye, advanced atrophic with subfoveal involvement: Secondary | ICD-10-CM

## 2021-07-08 DIAGNOSIS — H353221 Exudative age-related macular degeneration, left eye, with active choroidal neovascularization: Secondary | ICD-10-CM | POA: Diagnosis not present

## 2021-07-08 MED ORDER — BEVACIZUMAB 2.5 MG/0.1ML IZ SOSY
2.5000 mg | PREFILLED_SYRINGE | INTRAVITREAL | Status: AC | PRN
  Administered 2021-07-08: 2.5 mg via INTRAVITREAL

## 2021-07-08 NOTE — Assessment & Plan Note (Signed)
Chronic subfoveal mature CNVM accounts for the intact perfusion but also  lingering chronic activity of the CNVM OS.  Repeat injection today at 6-week and maintain 6 to 7-week interval next

## 2021-07-08 NOTE — Assessment & Plan Note (Signed)
Central geographic atrophy, no change over time stable no sign of CNVM

## 2021-07-08 NOTE — Progress Notes (Signed)
07/08/2021     CHIEF COMPLAINT Patient presents for  Chief Complaint  Patient presents with   Retina Follow Up      HISTORY OF PRESENT ILLNESS: Mark Wu is a 87 y.o. male who presents to the clinic today for:   HPI     Retina Follow Up   Patient presents with  Wet AMD.  In left eye.  This started 6 weeks ago.  Severity is mild.  Duration of 6 weeks.  Since onset it is stable.        Comments   6 week fu OU and Avastin OS/ OCT Pt states VA OU stable since last visit. Pt denies FOL, floaters, or ocular pain OU.  Pt states, "Dr. Katy Fitch fixed my right eyelid in his office on the 15th of September because it was rolling inward." Pt reports that he is using Latanoprost QHS OS       Last edited by Kendra Opitz, COA on 07/08/2021  9:34 AM.      Referring physician: [name], MD [place],  [place] [address]  HISTORICAL INFORMATION:   Selected notes from the [place]: Current Outpatient Medications (Ophthalmic Drugs)  Medication Sig   latanoprost (XALATAN) 0.005 % ophthalmic solution Place 1 drop into the left eye at bedtime. (Patient not taking: No sig reported)   No current facility-administered medications for this visit. (Ophthalmic Drugs)   Current Outpatient Medications (Other)  Medication Sig   amLODipine (NORVASC) 5 MG tablet TAKE 1 TABLET DAILY (NEED PHYSICAL)   Calcium Carb-Cholecalciferol (CALCIUM CARBONATE-VITAMIN D3 PO) Take by mouth.   clotrimazole-betamethasone (LOTRISONE) cream Apply topically as needed.   warfarin (COUMADIN) 2.5 MG tablet TAKE 1 TABLET DAILY EXCEPT TAKE 2 TABLETS ON MONDAYS OR TAKE AS DIRECTED BY ANTICOAGULATION CLINIC   No current facility-administered medications for this visit. (Other)      REVIEW OF SYSTEMS:    ALLERGIES No Known Allergies  PAST MEDICAL HISTORY Past Medical History:  Diagnosis Date   Anal fissure 06/11/2009   Atrial  fibrillation ([place]) 06/11/2009   HYPERTENSION 06/11/2009   NECK MASS 10/01/2010   Prostate cancer ([place])    Past Surgical History:  Procedure Laterality Date   CARPAL TUNNEL RELEASE     Both hands   CYST REMOVAL HAND     Left hand   CYSTOSCOPY  Jan 17, 1999   hand   PROSTATE BIOPSY      FAMILY HISTORY Family History  Problem Relation Age of Onset   Cancer Sister 72       unknown type   Cancer Brother        prostate/seed implant with [name]/died 2014-01-16 of heart attack    SOCIAL HISTORY Social History   Tobacco Use   Smoking status: Former    Types: Cigars    Quit date: 08/08/1991    Years since quitting: 29.9   Smokeless tobacco: Never  Vaping Use   Vaping Use: Never used  Substance Use Topics   Alcohol use: No   Drug use: No         OPHTHALMIC EXAM:  Base Eye Exam     Visual Acuity (ETDRS)       Right Left   Dist cc CF at 3' 20/40 -1   Dist ph cc  NI    Correction: Glasses         Tonometry (Tonopen, 9:33 AM)  Right Left   Pressure 15 14         Pupils       Pupils Dark Light Shape React APD   Right PERRL 5 4 Round Brisk None   Left PERRL 5 4 Round Brisk None         Visual Fields       Left Right    Full    Restrictions  Central scotoma         Extraocular Movement       Right Left    Full Full         Neuro/Psych     Oriented x3: Yes   Mood/Affect: Normal         Dilation     Both eyes: 1.0% Mydriacyl, 2.5% Phenylephrine @ 9:33 AM           Slit Lamp and Fundus Exam     External Exam       Right Left   External Normal Normal         Slit Lamp Exam       Right Left   Lids/Lashes Normal Normal   Conjunctiva/Sclera White and quiet White and quiet   Cornea Clear Clear   Anterior Chamber Deep and quiet Deep and quiet   Iris Round and reactive Round and reactive   Lens Centered posterior chamber intraocular lens Centered posterior chamber intraocular lens   Anterior Vitreous Normal Normal          Fundus Exam       Right Left   Posterior Vitreous Posterior vitreous detachment Posterior vitreous detachment, Central vitreous floaters   Disc Normal Peripapillary atrophy   C/D Ratio 0.2 0.15   Macula Hard drusen, Geographic atrophy, Disciform scar Less yet persistent subretinal hemorrhage small and inferior  to the fovea,,, Disciform scar with large mature choroidal vessels as the new blood supply to the macula, stable for years, Retinal pigment epithelial atrophy, Geographic atrophy   Vessels Normal Normal   Periphery Normal Normal            IMAGING AND PROCEDURES  Imaging and Procedures for 07/08/21  OCT, Retina - OU - Both Eyes       Right Eye Quality was good. Scan locations included subfoveal. Progression has been stable. Findings include abnormal foveal contour, subretinal hyper-reflective material, subretinal scarring.   Left Eye Quality was borderline. Scan locations included subfoveal. Progression has been stable. Findings include subretinal scarring, choroidal neovascular membrane, disciform scar, abnormal foveal contour, no SRF, no IRF.   Notes OS, stabilize lesion nasal to the fovea, chronically active and yet only stable at  6-week  follow-up interval.  OS improved today teffective use of Avastin some 5 weeks previous.  Repeat today and examination again in 7 weeks  OD with massive permanent disciform fibrotic scar, stable overall     Intravitreal Injection, Pharmacologic Agent - OS - Left Eye       Time Out 07/08/2021. 10:17 AM. Confirmed correct patient, procedure, site, and patient consented.   Anesthesia Topical anesthesia was used. Anesthetic medications included [place] 3.5%.   Procedure Preparation included Tobramycin 0.3%, 10% betadine to eyelids, 5% betadine to ocular surface, Ofloxacin . A 30 gauge needle was used.   Injection: 2.5 mg bevacizumab 2.5 MG/0.1ML   Route: Intravitreal, Site: Left Eye   NDC: [phone]   Post-op Post injection  exam found visual acuity of at least counting fingers. The patient tolerated the procedure well. There were  no complications. The patient received written and verbal post procedure care education. Post injection medications were not given.              ASSESSMENT/PLAN:  Nonexudative age-related macular degeneration, right eye, advanced atrophic with subfoveal involvement Central geographic atrophy, no change over time stable no sign of CNVM  Exudative age-related macular degeneration of left eye with active choroidal neovascularization (HCC) Chronic subfoveal mature CNVM accounts for the intact perfusion but also  lingering chronic activity of the CNVM OS.  Repeat injection today at 6-week and maintain 6 to 7-week interval next      ICD-10-CM   1. Exudative age-related macular degeneration of left eye with active choroidal neovascularization (HCC)  H35.3221 OCT, Retina - OU - Both Eyes    Intravitreal Injection, Pharmacologic Agent - OS - Left Eye    bevacizumab (AVASTIN) SOSY 2.5 mg    2. Nonexudative age-related macular degeneration, right eye, advanced atrophic with subfoveal involvement  H35.3114       1.  OS looks great today at 6-week interval.  Less intraretinal fluid.  Stable thickness, will repeat injection Avastin today and examination next again in 7 weeks OS  2.  OD no sign of recurrence of CNVM will observe  3.  Ophthalmic Meds Ordered this visit:  Meds ordered this encounter  Medications   bevacizumab (AVASTIN) SOSY 2.5 mg       Return in about 7 weeks (around 08/26/2021) for dilate, OS, AVASTIN OCT.  There are no Patient Instructions on file for this visit.   Explained the diagnoses, plan, and follow up with the patient and they expressed understanding.  Patient expressed understanding of the importance of proper follow up care.   Clent Demark Dorna Mallet M.D. Diseases & Surgery of the Retina and Vitreous [facility]ina [facility] [place] 07/08/21     Abbreviations: M myopia (nearsighted); A astigmatism; H hyperopia (farsighted); P presbyopia; Mrx spectacle prescription;  CTL contact lenses; OD right eye; OS left eye; OU both eyes  XT exotropia; ET esotropia; PEK punctate epithelial keratitis; PEE punctate epithelial erosions; DES dry eye syndrome; MGD meibomian gland dysfunction; ATs artificial tears; PFAT's preservative free artificial tears; [place] nuclear sclerotic cataract; PSC posterior subcapsular cataract; ERM epi-retinal membrane; PVD posterior vitreous detachment; RD retinal detachment; DM diabetes mellitus; DR diabetic retinopathy; NPDR non-proliferative diabetic retinopathy; PDR proliferative diabetic retinopathy; CSME clinically significant macular edema; DME diabetic macular edema; dbh dot blot hemorrhages; CWS cotton wool spot; POAG primary open angle glaucoma; C/D cup-to-disc ratio; HVF humphrey visual field; GVF goldmann visual field; OCT optical coherence tomography; IOP intraocular pressure; BRVO Branch retinal vein occlusion; CRVO central retinal vein occlusion; CRAO central retinal artery occlusion; BRAO branch retinal artery occlusion; RT retinal tear; SB scleral buckle; PPV pars plana vitrectomy; VH Vitreous hemorrhage; PRP panretinal laser photocoagulation; IVK intravitreal kenalog; VMT vitreomacular traction; MH Macular hole;  NVD neovascularization of the disc; NVE neovascularization elsewhere; AREDS age related eye disease study; ARMD age related macular degeneration; POAG primary open angle glaucoma; EBMD epithelial/anterior basement membrane dystrophy; ACIOL anterior chamber intraocular lens; IOL intraocular lens; PCIOL posterior chamber intraocular lens; Phaco/IOL phacoemulsification with intraocular lens placement; [place] photorefractive keratectomy; LASIK laser assisted in situ keratomileusis; HTN hypertension; DM diabetes mellitus; COPD chronic obstructive pulmonary disease

## 2021-07-14 DIAGNOSIS — H02035 Senile entropion of left lower eyelid: Secondary | ICD-10-CM | POA: Diagnosis not present

## 2021-08-16 ENCOUNTER — Ambulatory Visit: Payer: Medicare Other

## 2021-08-23 ENCOUNTER — Ambulatory Visit (INDEPENDENT_AMBULATORY_CARE_PROVIDER_SITE_OTHER): Payer: Medicare Other

## 2021-08-23 ENCOUNTER — Other Ambulatory Visit: Payer: Self-pay

## 2021-08-23 DIAGNOSIS — Z7901 Long term (current) use of anticoagulants: Secondary | ICD-10-CM

## 2021-08-23 LAB — POCT INR: INR: 2.2 (ref 2.0–3.0)

## 2021-08-23 NOTE — Progress Notes (Signed)
Continue to take 1 tablet daily except 2 tablets every Monday. Re-check in 6 weeks.

## 2021-08-23 NOTE — Patient Instructions (Addendum)
Pre visit review using our clinic review tool, if applicable. No additional management support is needed unless otherwise documented below in the visit note.  Continue to take 1 tablet daily except 2 tablets every Monday. Re-check in 6 weeks.

## 2021-09-01 ENCOUNTER — Telehealth: Payer: Self-pay

## 2021-09-01 NOTE — Telephone Encounter (Signed)
Pt has coumadin clinic apt on 12/19 but this nurse will not be in that day. LVM for pt to return call so he can be RS to another day.

## 2021-09-02 ENCOUNTER — Encounter (INDEPENDENT_AMBULATORY_CARE_PROVIDER_SITE_OTHER): Payer: Medicare Other | Admitting: Ophthalmology

## 2021-09-03 NOTE — Telephone Encounter (Signed)
RS for 12/28 at 130. Pt verbalized understanding

## 2021-09-07 ENCOUNTER — Ambulatory Visit (INDEPENDENT_AMBULATORY_CARE_PROVIDER_SITE_OTHER): Payer: Medicare Other | Admitting: Ophthalmology

## 2021-09-07 ENCOUNTER — Encounter (INDEPENDENT_AMBULATORY_CARE_PROVIDER_SITE_OTHER): Payer: Self-pay | Admitting: Ophthalmology

## 2021-09-07 ENCOUNTER — Other Ambulatory Visit: Payer: Self-pay

## 2021-09-07 DIAGNOSIS — H353221 Exudative age-related macular degeneration, left eye, with active choroidal neovascularization: Secondary | ICD-10-CM | POA: Diagnosis not present

## 2021-09-07 DIAGNOSIS — H353212 Exudative age-related macular degeneration, right eye, with inactive choroidal neovascularization: Secondary | ICD-10-CM | POA: Diagnosis not present

## 2021-09-07 MED ORDER — BEVACIZUMAB 2.5 MG/0.1ML IZ SOSY
2.5000 mg | PREFILLED_SYRINGE | INTRAVITREAL | Status: AC | PRN
  Administered 2021-09-07: 2.5 mg via INTRAVITREAL

## 2021-09-07 NOTE — Progress Notes (Signed)
09/07/2021     CHIEF COMPLAINT Patient presents for  Chief Complaint  Patient presents with   Retina Follow Up      HISTORY OF PRESENT ILLNESS: Mark Wu is a 87 y.o. male who presents to the clinic today for:   HPI     Retina Follow Up   Patient presents with  Wet AMD.  In left eye.  This started 7 weeks ago.  Severity is mild.  Duration of 7 weeks.  Since onset it is stable.        Comments   7 weeks fu OS oct avastin OS. Patient states vision is stable and unchanged since last visit. Denies any new floaters or FOL. Latanoprost qhs OS.      Last edited by Laurin Coder on 09/07/2021  9:21 AM.      Referring physician: [name], MD [place],  [place] [address]  HISTORICAL INFORMATION:   Selected notes from the MEDICAL RECORD NUMBER       CURRENT MEDICATIONS: Current Outpatient Medications (Ophthalmic Drugs)  Medication Sig   latanoprost (XALATAN) 0.005 % ophthalmic solution Place 1 drop into the left eye at bedtime. (Patient not taking: No sig reported)   No current facility-administered medications for this visit. (Ophthalmic Drugs)   Current Outpatient Medications (Other)  Medication Sig   amLODipine (NORVASC) 5 MG tablet TAKE 1 TABLET DAILY (NEED PHYSICAL)   Calcium Carb-Cholecalciferol (CALCIUM CARBONATE-VITAMIN D3 PO) Take by mouth.   clotrimazole-betamethasone (LOTRISONE) cream Apply topically as needed.   warfarin (COUMADIN) 2.5 MG tablet TAKE 1 TABLET DAILY EXCEPT TAKE 2 TABLETS ON MONDAYS OR TAKE AS DIRECTED BY ANTICOAGULATION CLINIC   No current facility-administered medications for this visit. (Other)      REVIEW OF SYSTEMS:    ALLERGIES No Known Allergies  PAST MEDICAL HISTORY Past Medical History:  Diagnosis Date   Anal fissure 06/11/2009   Atrial fibrillation ([place]) 06/11/2009   HYPERTENSION 06/11/2009   NECK MASS 10/01/2010   Prostate cancer ([place])    Past Surgical History:  Procedure  Laterality Date   CARPAL TUNNEL RELEASE     Both hands   CYST REMOVAL HAND     Left hand   CYSTOSCOPY  2000   hand   PROSTATE BIOPSY      FAMILY HISTORY Family History  Problem Relation Age of Onset   Cancer Sister 69       unknown type   Cancer Brother        prostate/seed implant with [name]/died 12-31-2013 of heart attack    SOCIAL HISTORY Social History   Tobacco Use   Smoking status: Former    Types: Cigars    Quit date: 08/08/1991    Years since quitting: 30.1   Smokeless tobacco: Never  Vaping Use   Vaping Use: Never used  Substance Use Topics   Alcohol use: No   Drug use: No         OPHTHALMIC EXAM:  Base Eye Exam     Visual Acuity (ETDRS)       Right Left   Dist cc CF at 3' 20/30   Dist ph cc NI          Tonometry (Tonopen, 9:24 AM)       Right Left   Pressure 9 12         Pupils       Pupils Dark Light APD   Right PERRL 5 4 None  Left PERRL 5 4 None         Extraocular Movement       Right Left    Full Full         Neuro/Psych     Oriented x3: Yes   Mood/Affect: Normal         Dilation     Left eye: 1.0% Mydriacyl, 2.5% Phenylephrine @ 9:24 AM            IMAGING AND PROCEDURES  Imaging and Procedures for 09/07/21  Intravitreal Injection, Pharmacologic Agent - OS - Left Eye       Time Out 09/07/2021. 9:39 AM. Confirmed correct patient, procedure, site, and patient consented.   Anesthesia Topical anesthesia was used. Anesthetic medications included Lidocaine 4%.   Procedure Preparation included Tobramycin 0.3%, 10% betadine to eyelids, 5% betadine to ocular surface, Ofloxacin . A 30 gauge needle was used.   Injection: 2.5 mg bevacizumab 2.5 MG/0.1ML   Route: Intravitreal, Site: Left Eye   NDC: [phone], Lot: [id number]   Post-op Post injection exam found visual acuity of at least counting fingers. The patient tolerated the procedure well. There were no complications. The patient received written and  verbal post procedure care education. Post injection medications included ocuflox.      OCT, Retina - OU - Both Eyes       Right Eye Quality was good. Scan locations included subfoveal. Central Foveal Thickness: 304. Progression has been stable. Findings include abnormal foveal contour, subretinal hyper-reflective material, subretinal scarring.   Left Eye Quality was borderline. Scan locations included subfoveal. Central Foveal Thickness: 337. Progression has been stable. Findings include subretinal scarring, choroidal neovascular membrane, disciform scar, abnormal foveal contour, no SRF, no IRF.   Notes OS, stabilize lesion nasal to the fovea, chronically active and yet only stable at 7 week  follow-up interval.    OD with massive permanent disciform fibrotic scar, stable overall             ASSESSMENT/PLAN:  Exudative age-related macular degeneration of left eye with active choroidal neovascularization (HCC) OS, with subfoveal disciform scar, chronically active with stable acuity preserved now for some years with chronic therapy Avastin currently at 8-week interval.  We will repeat injection today to maintain  Exudative age-related macular degeneration of right eye with inactive choroidal neovascularization ([place]) Central scarring accounts for his acuity     ICD-10-CM   1. Exudative age-related macular degeneration of left eye with active choroidal neovascularization (HCC)  H35.3221 Intravitreal Injection, Pharmacologic Agent - OS - Left Eye    OCT, Retina - OU - Both Eyes    bevacizumab (AVASTIN) SOSY 2.5 mg    2. Exudative age-related macular degeneration of right eye with inactive choroidal neovascularization (HCC)  H35.3212       OS, with chronic active subfoveal CNVM, with activity in the past upon lengthen the interval post 3-week interval follow-up.  We will repeat injection Avastin today maintain 8-week interval and this monocular patient  2.  3.  Ophthalmic  Meds Ordered this visit:  Meds ordered this encounter  Medications   bevacizumab (AVASTIN) SOSY 2.5 mg       Return in about 8 weeks (around 11/02/2021) for DILATE OU, AVASTIN OCT, OS.  There are no Patient Instructions on file for this visit.   Explained the diagnoses, plan, and follow up with the patient and they expressed understanding.  Patient expressed understanding of the importance of proper follow up care.   Clent Demark Yeslin Delio  M.D. Diseases & Surgery of the Retina and [place] 09/07/21     Abbreviations: M myopia (nearsighted); A astigmatism; H hyperopia (farsighted); P presbyopia; Mrx spectacle prescription;  CTL contact lenses; OD right eye; OS left eye; OU both eyes  XT exotropia; ET esotropia; PEK punctate epithelial keratitis; PEE punctate epithelial erosions; DES dry eye syndrome; MGD meibomian gland dysfunction; ATs artificial tears; PFAT's preservative free artificial tears; [place] nuclear sclerotic cataract; PSC posterior subcapsular cataract; ERM epi-retinal membrane; PVD posterior vitreous detachment; RD retinal detachment; DM diabetes mellitus; DR diabetic retinopathy; NPDR non-proliferative diabetic retinopathy; PDR proliferative diabetic retinopathy; CSME clinically significant macular edema; DME diabetic macular edema; dbh dot blot hemorrhages; CWS cotton wool spot; POAG primary open angle glaucoma; C/D cup-to-disc ratio; HVF humphrey visual field; GVF goldmann visual field; OCT optical coherence tomography; IOP intraocular pressure; BRVO Branch retinal vein occlusion; CRVO central retinal vein occlusion; CRAO central retinal artery occlusion; BRAO branch retinal artery occlusion; RT retinal tear; SB scleral buckle; PPV pars plana vitrectomy; VH Vitreous hemorrhage; PRP panretinal laser photocoagulation; IVK intravitreal kenalog; VMT vitreomacular traction; MH Macular hole;  NVD neovascularization of the disc; NVE neovascularization elsewhere;  AREDS age related eye disease study; ARMD age related macular degeneration; POAG primary open angle glaucoma; EBMD epithelial/anterior basement membrane dystrophy; ACIOL anterior chamber intraocular lens; IOL intraocular lens; PCIOL posterior chamber intraocular lens; Phaco/IOL phacoemulsification with intraocular lens placement; [place] photorefractive keratectomy; LASIK laser assisted in situ keratomileusis; HTN hypertension; DM diabetes mellitus; COPD chronic obstructive pulmonary disease

## 2021-09-07 NOTE — Assessment & Plan Note (Signed)
OS, with subfoveal disciform scar, chronically active with stable acuity preserved now for some years with chronic therapy Avastin currently at 8-week interval.  We will repeat injection today to maintain

## 2021-09-07 NOTE — Assessment & Plan Note (Signed)
Central scarring accounts for his acuity

## 2021-09-08 ENCOUNTER — Ambulatory Visit: Payer: Medicare Other | Admitting: Neurology

## 2021-09-25 ENCOUNTER — Emergency Department (HOSPITAL_COMMUNITY): Payer: Medicare Other

## 2021-09-25 ENCOUNTER — Emergency Department (HOSPITAL_COMMUNITY)
Admission: EM | Admit: 2021-09-25 | Discharge: 2021-09-25 | Disposition: A | Discharge status: Home / Self Care | Payer: Medicare Other | Attending: Emergency Medicine | Admitting: Emergency Medicine

## 2021-09-25 ENCOUNTER — Encounter (HOSPITAL_COMMUNITY): Payer: Self-pay | Admitting: *Deleted

## 2021-09-25 DIAGNOSIS — R42 Dizziness and giddiness: Secondary | ICD-10-CM | POA: Insufficient documentation

## 2021-09-25 DIAGNOSIS — R001 Bradycardia, unspecified: Secondary | ICD-10-CM | POA: Diagnosis not present

## 2021-09-25 DIAGNOSIS — R0602 Shortness of breath: Secondary | ICD-10-CM | POA: Diagnosis not present

## 2021-09-25 DIAGNOSIS — I499 Cardiac arrhythmia, unspecified: Secondary | ICD-10-CM | POA: Diagnosis not present

## 2021-09-25 DIAGNOSIS — Z87891 Personal history of nicotine dependence: Secondary | ICD-10-CM | POA: Insufficient documentation

## 2021-09-25 DIAGNOSIS — H6123 Impacted cerumen, bilateral: Secondary | ICD-10-CM | POA: Insufficient documentation

## 2021-09-25 DIAGNOSIS — Z8546 Personal history of malignant neoplasm of prostate: Secondary | ICD-10-CM | POA: Insufficient documentation

## 2021-09-25 DIAGNOSIS — I10 Essential (primary) hypertension: Secondary | ICD-10-CM | POA: Diagnosis not present

## 2021-09-25 DIAGNOSIS — I4891 Unspecified atrial fibrillation: Secondary | ICD-10-CM | POA: Diagnosis not present

## 2021-09-25 DIAGNOSIS — M2578 Osteophyte, vertebrae: Secondary | ICD-10-CM | POA: Diagnosis not present

## 2021-09-25 DIAGNOSIS — I484 Atypical atrial flutter: Secondary | ICD-10-CM | POA: Diagnosis not present

## 2021-09-25 DIAGNOSIS — Z79899 Other long term (current) drug therapy: Secondary | ICD-10-CM | POA: Diagnosis not present

## 2021-09-25 LAB — BASIC METABOLIC PANEL
Anion gap: 7 (ref 5–15)
BUN: 13 mg/dL (ref 8–23)
CO2: 25 mmol/L (ref 22–32)
Calcium: 9.1 mg/dL (ref 8.9–10.3)
Chloride: 105 mmol/L (ref 98–111)
Creatinine, Ser: 0.9 mg/dL (ref 0.61–1.24)
GFR, Estimated: >60 mL/min (ref >60)
Glucose, Bld: 106 mg/dL — ABNORMAL HIGH (ref 70–99)
Potassium: 4.2 mmol/L (ref 3.5–5.1)
Sodium: 137 mmol/L (ref 135–145)

## 2021-09-25 LAB — CBC
HCT: 43.6 % (ref 39.0–52.0)
Hemoglobin: 14.4 g/dL (ref 13.0–17.0)
MCH: 30.1 pg (ref 26.0–34.0)
MCHC: 33 g/dL (ref 30.0–36.0)
MCV: 91.2 fL (ref 80.0–100.0)
Platelets: 249 10*3/uL (ref 150–400)
RBC: 4.78 MIL/uL (ref 4.22–5.81)
RDW: 14.9 % (ref 11.5–15.5)
WBC: 7.6 10*3/uL (ref 4.0–10.5)
nRBC: 0 % (ref 0.0–0.2)

## 2021-09-25 LAB — TROPONIN I (HIGH SENSITIVITY)
Troponin I (High Sensitivity): 9 ng/L (ref <18)
Troponin I (High Sensitivity): 10 ng/L (ref <18)

## 2021-09-25 LAB — PROTIME-INR
INR: 2.4 — ABNORMAL HIGH (ref 0.8–1.2)
Prothrombin Time: 26.3 seconds — ABNORMAL HIGH (ref 11.4–15.2)

## 2021-09-25 NOTE — ED Triage Notes (Signed)
Referred from urgent care for irregular heart rate

## 2021-09-25 NOTE — ED Provider Notes (Signed)
[facility] Provider Note   CSN: [id number] Arrival date & time: 09/25/21  1456     History No chief complaint on file.   Mark Wu is a 87 y.o. male.  HPI Patient sent from urgent care.  Has been had episodes of dizziness.  Both yesterday and then again this morning.  Was worse with turning his head.  Felt as if the room was spinning around.  It was not as if he was going to pass out.  Resolved with some time.  Stood up and felt unsteady with it but that also resolved.  Went to urgent care had an EKG and reportedly sent here.  States he was told to drink a lot of water.  States he was told that he had a lot of wax in his ears also.  Has not really had episodes like this before.  Is on Coumadin for atrial fibrillation.  Patient is rather hard of hearing and somewhat difficult to get history from although history does come from him and his wife.    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/24/2020   Exudative age-related macular degeneration of right eye with inactive choroidal neovascularization ([place]) 03/24/2020   Long term (current) use of anticoagulants 07/10/2017   Prostate cancer ([place]) 05/29/2017   GERD (gastroesophageal reflux disease) 11/14/2016   Encounter for therapeutic drug monitoring 02/23/2015   Macular degeneration 08/08/2011   Hearing loss 08/08/2011   NECK MASS 10/01/2010   Essential  hypertension 06/11/2009   ATRIAL FIBRILLATION 06/11/2009    Past Surgical History:  Procedure Laterality Date   CARPAL TUNNEL RELEASE     Both hands   CYST REMOVAL HAND     Left hand   CYSTOSCOPY  2000   hand   PROSTATE BIOPSY         Family History  Problem Relation Age of Onset   Cancer Sister 41       unknown type   Cancer Brother        prostate/seed implant with [name]/died Jan 26, 2014 of heart attack    Social History   Tobacco Use   Smoking status: Former    Types: Cigars    Quit date: 08/08/1991    Years since quitting: 30.1   Smokeless tobacco: Never  Vaping Use   Vaping Use: Never used  Substance Use Topics   Alcohol use: No   Drug use: No    Home Medications Prior to Admission medications   Medication Sig Start Date End Date Taking? Authorizing Provider  amLODipine (NORVASC) 5 MG tablet TAKE 1 TABLET DAILY (NEED PHYSICAL) 03/16/21   Burchette, [name], MD  Calcium Carb-Cholecalciferol (CALCIUM CARBONATE-VITAMIN D3 PO) Take by mouth.    [provider]  clotrimazole-betamethasone (LOTRISONE) cream Apply topically as needed. 06/22/21   Burchette, [name], MD  latanoprost (XALATAN) 0.005 % ophthalmic solution Place 1 drop into the left eye at bedtime. Patient not taking: No sig reported 02/25/21 02/25/22  Rankin, Clent Demark, MD  warfarin (COUMADIN) 2.5 MG tablet TAKE 1 TABLET DAILY EXCEPT TAKE 2 TABLETS ON MONDAYS OR TAKE AS DIRECTED BY ANTICOAGULATION CLINIC 05/27/21   Burchette, [name], MD    Allergies    Patient has no known allergies.  Review of Systems   Review of Systems  Constitutional:  Negative for appetite change.  HENT:  Negative for congestion.   Respiratory:  Negative for shortness of breath.   Gastrointestinal:  Negative for abdominal pain.  Genitourinary:  Negative for flank pain.  Musculoskeletal:  Negative for back pain.  Skin:  Negative for rash.  Neurological:  Positive for dizziness. Negative for speech difficulty.   Psychiatric/Behavioral:  Negative for confusion.    Physical Exam Updated Vital Signs BP (!) 183/85   Pulse (!) 45   Temp (!) 97.3 F (36.3 C) (Oral)   Resp 15   SpO2 100%   Physical Exam Vitals and nursing note reviewed.  HENT:     Head: Normocephalic.     Ears:     Comments: Bilateral TMs obscured by cerumen.  Uses hearing aid on left. Eyes:     Extraocular Movements: Extraocular movements intact.     Pupils: Pupils are equal, round, and reactive to light.  Cardiovascular:     Rate and Rhythm: Bradycardia present. Rhythm irregular.  Pulmonary:     Breath sounds: No wheezing or rhonchi.  Abdominal:     Tenderness: There is no abdominal tenderness.  Musculoskeletal:        General: Normal range of motion.     Cervical back: Neck supple.  Skin:    General: Skin is warm.     Capillary Refill: Capillary refill takes less than 2 seconds.  Neurological:     Mental Status: He is alert and oriented to person, place, and time.     Comments: Difficult hearing.  Finger-nose intact bilaterally.  Nystagmus with end gaze to the left.  Conjugate gaze    ED Results / Procedures / Treatments   Labs (all labs ordered are listed, but only abnormal results are displayed) Labs Reviewed  BASIC METABOLIC PANEL - Abnormal; Notable for the following components:      Result Value   Glucose, Bld 106 (*)    All other components within normal limits  PROTIME-INR - Abnormal; Notable for the following components:   Prothrombin Time 26.3 (*)    INR 2.4 (*)    All other components within normal limits  CBC  TROPONIN I (HIGH SENSITIVITY)  TROPONIN I (HIGH SENSITIVITY)    EKG EKG Interpretation  Date/Time:  Saturday September 25 2021 15:16:36 EST Ventricular Rate:  50 PR Interval:    QRS Duration: 140 QT Interval:  466 QTC Calculation: 424 R Axis:   -70 Text Interpretation: Atrial fibrillation with slow ventricular response Right bundle branch block Left anterior fascicular block  Bifascicular block  Abnormal ECG No significant change since last tracing Confirmed by Davonna Belling [phone]) on 09/25/2021 3:23:22 PM  Radiology DG Chest 2 View  Result Date: 09/25/2021 CLINICAL DATA:  Pt states he was sent over by urgent care due to irregular heart beat. PT denies cp, fever or sob. HX of CA and hypertension. irregular heart rae EXAM: CHEST - 2 VIEW COMPARISON:  None. FINDINGS: Normal mediastinum and cardiac silhouette. Normal pulmonary vasculature. No evidence of effusion, infiltrate, or pneumothorax. No acute bony abnormality. Degenerative osteophytosis of the spine. IMPRESSION: No acute cardiopulmonary process. Electronically Signed   By: Suzy Bouchard M.D.   On: 09/25/2021  15:36    Procedures Procedures   Medications Ordered in ED Medications - No data to display  ED Course  I have reviewed the triage vital signs and the nursing notes.  Pertinent labs & imaging results that were available during my care of the patient were reviewed by me and considered in my medical decision making (see chart for details).    MDM Rules/Calculators/A&P                           Patient with vertigo.  Had a couple episodes.  Worse with certain positions.  Besides nystagmus benign exam.  Did have impacted cerumen bilaterally.  Cerumen removed and patient feeling better.  Less dizziness.  Is somewhat high risk for stroke having A. fib and with his age but I think this likely peripheral vertigo.  Discussed with patient and family do not feel we need further work-up at this time.  Will discharge home with outpatient follow-up as needed Final Clinical Impression(s) / ED Diagnoses Final diagnoses:  Vertigo  Bilateral impacted cerumen    Rx / DC Orders ED Discharge Orders     None        Davonna Belling, MD 09/25/21 [phone]

## 2021-09-25 NOTE — Discharge Instructions (Addendum)
It is likely the dizziness came from your ears.  There was wax on both sides.  You felt better after it.  Follow-up with Dr. Elease Hashimoto as needed.  Return for worsening symptoms

## 2021-10-04 ENCOUNTER — Ambulatory Visit: Payer: TRICARE For Life (TFL)

## 2021-10-07 DIAGNOSIS — C61 Malignant neoplasm of prostate: Secondary | ICD-10-CM | POA: Diagnosis not present

## 2021-10-13 ENCOUNTER — Ambulatory Visit (INDEPENDENT_AMBULATORY_CARE_PROVIDER_SITE_OTHER): Payer: Medicare Other

## 2021-10-13 DIAGNOSIS — Z7901 Long term (current) use of anticoagulants: Secondary | ICD-10-CM | POA: Diagnosis not present

## 2021-10-13 LAB — POCT INR: INR: 1.9 — AB (ref 2.0–3.0)

## 2021-10-13 NOTE — Progress Notes (Signed)
Increase dose to take 2 tablets and then continue to take 1 tablet daily except 2 tablets every Monday. Re-check in 6 weeks.

## 2021-10-13 NOTE — Patient Instructions (Addendum)
Pre visit review using our clinic review tool, if applicable. No additional management support is needed unless otherwise documented below in the visit note.  Increase dose to take 2 tablets and then continue to take 1 tablet daily except 2 tablets every Monday. Re-check in 6 weeks.

## 2021-10-14 DIAGNOSIS — C61 Malignant neoplasm of prostate: Secondary | ICD-10-CM | POA: Diagnosis not present

## 2021-10-14 DIAGNOSIS — Z5111 Encounter for antineoplastic chemotherapy: Secondary | ICD-10-CM | POA: Diagnosis not present

## 2021-10-29 ENCOUNTER — Other Ambulatory Visit: Payer: Self-pay | Admitting: Family Medicine

## 2021-11-09 ENCOUNTER — Ambulatory Visit (INDEPENDENT_AMBULATORY_CARE_PROVIDER_SITE_OTHER): Payer: Medicare Other | Admitting: Ophthalmology

## 2021-11-09 ENCOUNTER — Other Ambulatory Visit: Payer: Self-pay

## 2021-11-09 DIAGNOSIS — H4052X2 Glaucoma secondary to other eye disorders, left eye, moderate stage: Secondary | ICD-10-CM | POA: Diagnosis not present

## 2021-11-09 DIAGNOSIS — H353221 Exudative age-related macular degeneration, left eye, with active choroidal neovascularization: Secondary | ICD-10-CM | POA: Diagnosis not present

## 2021-11-09 DIAGNOSIS — H353114 Nonexudative age-related macular degeneration, right eye, advanced atrophic with subfoveal involvement: Secondary | ICD-10-CM

## 2021-11-09 DIAGNOSIS — H353212 Exudative age-related macular degeneration, right eye, with inactive choroidal neovascularization: Secondary | ICD-10-CM | POA: Diagnosis not present

## 2021-11-09 MED ORDER — BEVACIZUMAB 2.5 MG/0.1ML IZ SOSY
2.5000 mg | PREFILLED_SYRINGE | INTRAVITREAL | Status: AC | PRN
  Administered 2021-11-09: 11:00:00 2.5 mg via INTRAVITREAL

## 2021-11-09 NOTE — Progress Notes (Signed)
11/09/2021     CHIEF COMPLAINT Patient presents for  Chief Complaint  Patient presents with   Macular Degeneration      HISTORY OF PRESENT ILLNESS: Mark Wu is a 88 y.o. male who presents to the clinic today for:   HPI   OS follow-up today at 9-week interval for chronic active subfoveal CNVM with intraretinal fluid overlying and preserved acuity. No change in allerg ies today at 9-week interval follow-up.  Stable acuity.  Last edited by Hurman Horn, MD on 11/09/2021  9:47 AM.      Referring physician: [name], MD [place],  [place] [id number]  HISTORICAL INFORMATION:   Selected notes from the MEDICAL RECORD NUMBER       CURRENT MEDICATIONS: Current Outpatient Medications (Ophthalmic Drugs)  Medication Sig   latanoprost (XALATAN) 0.005 % ophthalmic solution Place 1 drop into the left eye at bedtime. (Patient not taking: No sig reported)   No current facility-administered medications for this visit. (Ophthalmic Drugs)   Current Outpatient Medications (Other)  Medication Sig   amLODipine (NORVASC) 5 MG tablet TAKE 1 TABLET DAILY (NEED PHYSICAL)   Calcium Carb-Cholecalciferol (CALCIUM CARBONATE-VITAMIN D3 PO) Take by mouth.   clotrimazole-betamethasone (LOTRISONE) cream APPLY TOPICALLY AS NEEDED   warfarin (COUMADIN) 2.5 MG tablet TAKE 1 TABLET DAILY EXCEPT TAKE 2 TABLETS ON MONDAYS OR TAKE AS DIRECTED BY ANTICOAGULATION CLINIC   No current facility-administered medications for this visit. (Other)      REVIEW OF SYSTEMS: ROS   Negative for: Constitutional, Gastrointestinal, Neurological, Skin, Genitourinary, Musculoskeletal, HENT, Endocrine, Cardiovascular, Eyes, Respiratory, Psychiatric, Allergic/Imm, Heme/Lymph Last edited by Hurman Horn, MD on 11/09/2021  9:41 AM.       ALLERGIES No Known Allergies  PAST MEDICAL HISTORY Past Medical History:  Diagnosis Date   Anal fissure 06/11/2009   Atrial fibrillation ([place])  06/11/2009   HYPERTENSION 06/11/2009   NECK MASS 10/01/2010   Prostate cancer ([place])    Past Surgical History:  Procedure Laterality Date   CARPAL TUNNEL RELEASE     Both hands   CYST REMOVAL HAND     Left hand   CYSTOSCOPY  2000   hand   PROSTATE BIOPSY      FAMILY HISTORY Family History  Problem Relation Age of Onset   Cancer Sister 25       unknown type   Cancer Brother        prostate/seed implant with [name]/died 01-22-14 of heart attack    SOCIAL HISTORY Social History   Tobacco Use   Smoking status: Former    Types: Cigars    Quit date: 08/08/1991    Years since quitting: 30.2   Smokeless tobacco: Never  Vaping Use   Vaping Use: Never used  Substance Use Topics   Alcohol use: No   Drug use: No         OPHTHALMIC EXAM:  Base Eye Exam     Visual Acuity (ETDRS)       Right Left   Dist cc CF at 3' 20/30 +2         Tonometry (Tonopen, 9:45 AM)       Right Left   Pressure 9 12         Pupils       Pupils   Right PERRL   Left PERRL         Visual Fields       Left Right    Full  Restrictions  Partial inner superior temporal, inferior temporal, superior nasal, inferior nasal deficiencies         Extraocular Movement       Right Left    Full, Ortho Full, Ortho         Neuro/Psych     Oriented x3: Yes   Mood/Affect: Normal         Dilation     Left eye: 1.0% Mydriacyl, 2.5% Phenylephrine @ 9:43 AM           Slit Lamp and Fundus Exam     External Exam       Right Left   External Normal Normal         Slit Lamp Exam       Right Left   Lids/Lashes Normal Normal   Conjunctiva/Sclera White and quiet White and quiet   Cornea Clear Clear   Anterior Chamber Deep and quiet Deep and quiet   Iris Round and reactive Round and reactive   Lens Centered posterior chamber intraocular lens Centered posterior chamber intraocular lens   Anterior Vitreous Normal Normal         Fundus Exam       Right Left    Posterior Vitreous Posterior vitreous detachment Posterior vitreous detachment, Central vitreous floaters   Disc Normal Peripapillary atrophy   C/D Ratio 0.2 0.15   Macula Hard drusen, Geographic atrophy, Disciform scar Less yet persistent subretinal hemorrhage small and inferior  to the fovea,,, Disciform scar with large mature choroidal vessels as the new blood supply to the macula, stable for years, Retinal pigment epithelial atrophy, Geographic atrophy   Vessels Normal Normal   Periphery Normal Normal            IMAGING AND PROCEDURES  Imaging and Procedures for 11/09/21  OCT, Retina - OU - Both Eyes       Right Eye Quality was good. Scan locations included subfoveal. Central Foveal Thickness: 354. Progression has been stable. Findings include abnormal foveal contour, subretinal hyper-reflective material, subretinal scarring.   Left Eye Quality was borderline. Scan locations included subfoveal. Progression has been stable. Findings include subretinal scarring, choroidal neovascular membrane, disciform scar, abnormal foveal contour, no SRF, no IRF.   Notes OS, stabilize lesion nasal to the fovea, chronically active and yet only stable at 9  week  follow-up interval.  Repeat injection OS today to maintain and follow-up again in 8 to 9-week  OD with massive permanent disciform fibrotic scar, stable overall     Intravitreal Injection, Pharmacologic Agent - OS - Left Eye       Time Out 11/09/2021. 10:35 AM. Confirmed correct patient, procedure, site, and patient consented.   Anesthesia Topical anesthesia was used. Anesthetic medications included Lidocaine 4%.   Procedure Preparation included Tobramycin 0.3%, 10% betadine to eyelids, 5% betadine to ocular surface, Ofloxacin . A 30 gauge needle was used.   Injection: 2.5 mg bevacizumab 2.5 MG/0.1ML   Route: Intravitreal, Site: Left Eye   NDC: [phone], Lot: [id number]   Post-op Post injection exam found visual acuity of  at least counting fingers. The patient tolerated the procedure well. There were no complications. The patient received written and verbal post procedure care education. Post injection medications included ocuflox.              ASSESSMENT/PLAN:  Exudative age-related macular degeneration of left eye with active choroidal neovascularization (HCC)   History of chronic recurrences.  We will repeat injection today and follow-up again in  9 weeks overall patient reports no interval change in acuity, at 9-week interval    Exudative age-related macular degeneration of right eye with inactive choroidal neovascularization (HCC) No signs of active CNVM OD  Secondary open-angle glaucoma, left, moderate stage Stable overall     ICD-10-CM   1. Exudative age-related macular degeneration of left eye with active choroidal neovascularization (HCC)  H35.3221 OCT, Retina - OU - Both Eyes    Intravitreal Injection, Pharmacologic Agent - OS - Left Eye    bevacizumab (AVASTIN) SOSY 2.5 mg    2. Nonexudative age-related macular degeneration, right eye, advanced atrophic with subfoveal involvement  H35.3114     3. Exudative age-related macular degeneration of right eye with inactive choroidal neovascularization ([place])  H35.3212     4. Secondary open-angle glaucoma, left, moderate stage  H40.52X2       1.  OS with stable acuity and stable macular findings by clinical examination and OCT.  Currently 9-week follow-up.  Repeat injection today of Avastin to maintain and follow-up again in 8 weeks  2.  3.  Ophthalmic Meds Ordered this visit:  Meds ordered this encounter  Medications   bevacizumab (AVASTIN) SOSY 2.5 mg       Return in about 8 weeks (around 01/04/2022) for dilate, OS, AVASTIN OCT.  There are no Patient Instructions on file for this visit.   Explained the diagnoses, plan, and follow up with the patient and they expressed understanding.  Patient expressed understanding of the  importance of proper follow up care.   Clent Demark Tameca Jerez M.D. Diseases & Surgery of the Retina and Vitreous [facility] [place] 11/09/21     Abbreviations: M myopia (nearsighted); A astigmatism; H hyperopia (farsighted); P presbyopia; Mrx spectacle prescription;  CTL contact lenses; OD right eye; OS left eye; OU both eyes  XT exotropia; ET esotropia; PEK punctate epithelial keratitis; PEE punctate epithelial erosions; DES dry eye syndrome; MGD meibomian gland dysfunction; ATs artificial tears; PFAT's preservative free artificial tears; [place] nuclear sclerotic cataract; PSC posterior subcapsular cataract; ERM epi-retinal membrane; PVD posterior vitreous detachment; RD retinal detachment; DM diabetes mellitus; DR diabetic retinopathy; NPDR non-proliferative diabetic retinopathy; PDR proliferative diabetic retinopathy; CSME clinically significant macular edema; DME diabetic macular edema; dbh dot blot hemorrhages; CWS cotton wool spot; POAG primary open angle glaucoma; C/D cup-to-disc ratio; HVF humphrey visual field; GVF goldmann visual field; OCT optical coherence tomography; IOP intraocular pressure; BRVO Branch retinal vein occlusion; CRVO central retinal vein occlusion; CRAO central retinal artery occlusion; BRAO branch retinal artery occlusion; RT retinal tear; SB scleral buckle; PPV pars plana vitrectomy; VH Vitreous hemorrhage; PRP panretinal laser photocoagulation; IVK intravitreal kenalog; VMT vitreomacular traction; MH Macular hole;  NVD neovascularization of the disc; NVE neovascularization elsewhere; AREDS age related eye disease study; ARMD age related macular degeneration; POAG primary open angle glaucoma; EBMD epithelial/anterior basement membrane dystrophy; ACIOL anterior chamber intraocular lens; IOL intraocular lens; PCIOL posterior chamber intraocular lens; Phaco/IOL phacoemulsification with intraocular lens placement; [place] photorefractive keratectomy; LASIK laser assisted in situ  keratomileusis; HTN hypertension; DM diabetes mellitus; COPD chronic obstructive pulmonary disease

## 2021-11-09 NOTE — Assessment & Plan Note (Signed)
No signs of active CNVM OD

## 2021-11-09 NOTE — Assessment & Plan Note (Addendum)
° °  History of chronic recurrences.  We will repeat injection today and follow-up again in 9 weeks overall patient reports no interval change in acuity, at 9-week interval

## 2021-11-09 NOTE — Assessment & Plan Note (Signed)
Stable overall.  

## 2021-11-18 NOTE — Progress Notes (Signed)
NEUROLOGY FOLLOW UP OFFICE NOTE  Mark Wu [id number]  Assessment/Plan:   1.  Transient ischemic attack presenting with right leg numbness and weakness. 2.  Atrial fibrillation on AC 3.  Hypertension   1.  Secondary stroke prevention as managed by PCP: -  Warfarin - Normotensive blood pressure - follow up with PCP regarding elevated blood pressure - LDL goal less than 70 - Glycemic control.  Hgb A1c goal less than 7 2.  Follow up as needed.   Subjective:  Mark Wu is a 88 year old right-handed male with atrial fibrillation, HTN, macular degeneration and history of prostate cancer who follows up for TIA.  He is accompanied by his wife.   UPDATE: Current medications:  Warfrain, amlodipine      Sometimes feels a sharp paroxysmal pain in the right heel every now and then.  No recurrence of weakness and numbness of the entire right lower extremity.  With the cane, balance is better.   HISTORY: On 08/09/2020, he had fallen on his left buttock, sustaining a hematoma.  He was told to hold his Coumadin for 2 days.  On 08/16/2020, he stood up from the chair at breakfast and he couldn't feel his right leg.  His entire right leg from hip to foot, was numb and weak.  He denied associated pain or weakness and no involvement of the face, upper extremity or speech.  He denied incontinence.  Lumbar spine X-ray personally reviewed showed moderate to severe degenerative changes but no acute fracture.  CT of head personally reviewed was negative for acute abnormality.  Follow up MRI of brain personally reviewed showed a punctate diffusion weighted hyperintense focus was seen in the right pons but only on axial diffusion-weighted sequences, reflecting image noise or less likely acute/early subacute infarct.  She was found to have hyponatremia with Na of 119, which was treated.  Symptoms resolved before discharge the next day.  LDL in November was 83.  CTA of head and neck on 09/25/2020 showed mild  stenosis of right PCA P2 and mild atherosclerotic changes but no large vessel occlusion or hemodynamically significant stenosis.  Even though symptoms resolved, he feels unsure of himself and has been relying on his walker to ambulate due to fear of falling.  PAST MEDICAL HISTORY: Past Medical History:  Diagnosis Date   Anal fissure 06/11/2009   Atrial fibrillation ([place]) 06/11/2009   HYPERTENSION 06/11/2009   NECK MASS 10/01/2010   Prostate [facility])     MEDICATIONS: Current Outpatient Medications on File Prior to Visit  Medication Sig Dispense Refill   amLODipine (NORVASC) 5 MG tablet TAKE 1 TABLET DAILY (NEED PHYSICAL) 90 tablet 3   Calcium Carb-Cholecalciferol (CALCIUM CARBONATE-VITAMIN D3 PO) Take by mouth.     clotrimazole-betamethasone (LOTRISONE) cream APPLY TOPICALLY AS NEEDED 45 g 7   latanoprost (XALATAN) 0.005 % ophthalmic solution Place 1 drop into the left eye at bedtime. (Patient not taking: No sig reported) 4.5 mL 3   warfarin (COUMADIN) 2.5 MG tablet TAKE 1 TABLET DAILY EXCEPT TAKE 2 TABLETS ON MONDAYS OR TAKE AS DIRECTED BY ANTICOAGULATION CLINIC 120 tablet 1   No current facility-administered medications on file prior to visit.    ALLERGIES: No Known Allergies  FAMILY HISTORY: Family History  Problem Relation Age of Onset   Cancer Sister 87       unknown type   Cancer Brother        prostate/seed implant with [name]/died 01/27/14 of heart attack  Objective:  Blood pressure (!) 162/84, pulse 68, height 5\' 9"  (1.753 m), weight 161 lb 9.6 oz (73.3 kg), SpO2 99 %. General: No acute distress.  Patient appears well-groomed.   Head:  Normocephalic/atraumatic Eyes:  Fundi examined but not visualized Neck: supple, no paraspinal tenderness, full range of motion Heart:  Regular rate and rhythm Lungs:  Clear to auscultation bilaterally Back: No paraspinal tenderness Neurological Exam: alert and oriented to person, place, and time. Speech fluent and not dysarthric,  language intact.  Decreased vision in right eye.  Decreased hearing.  Otherwise, CN II-XII intact. Bulk and tone normal, muscle strength 5/5 throughout.  Sensation to light touch  intact.  Deep tendon reflexes 2+ throughout.  Finger to nose testing intact.  Wide-based and cautious gait.    Metta Clines, DO  CC: [name], MD

## 2021-11-22 ENCOUNTER — Other Ambulatory Visit: Payer: Self-pay

## 2021-11-22 ENCOUNTER — Ambulatory Visit: Payer: Medicare Other

## 2021-11-22 ENCOUNTER — Encounter: Payer: Self-pay | Admitting: Neurology

## 2021-11-22 ENCOUNTER — Ambulatory Visit (INDEPENDENT_AMBULATORY_CARE_PROVIDER_SITE_OTHER): Payer: Medicare Other | Admitting: Neurology

## 2021-11-22 VITALS — BP 162/84 | HR 68 | Ht 69.0 in | Wt 161.6 lb

## 2021-11-22 DIAGNOSIS — I4891 Unspecified atrial fibrillation: Secondary | ICD-10-CM | POA: Diagnosis not present

## 2021-11-22 DIAGNOSIS — G459 Transient cerebral ischemic attack, unspecified: Secondary | ICD-10-CM

## 2021-11-22 DIAGNOSIS — I10 Essential (primary) hypertension: Secondary | ICD-10-CM | POA: Diagnosis not present

## 2021-11-22 NOTE — Patient Instructions (Addendum)
Continue warfarin Follow up with PCP regarding your blood pressure Follow up as needed

## 2021-11-23 ENCOUNTER — Other Ambulatory Visit: Payer: Self-pay | Admitting: Family Medicine

## 2021-11-23 DIAGNOSIS — Z7901 Long term (current) use of anticoagulants: Secondary | ICD-10-CM

## 2021-11-23 NOTE — Telephone Encounter (Signed)
Pt is compliant with warfarin management and PCP apts. ?Sent in refill.  ?

## 2021-11-29 ENCOUNTER — Ambulatory Visit: Payer: Medicare Other

## 2021-12-06 ENCOUNTER — Ambulatory Visit (INDEPENDENT_AMBULATORY_CARE_PROVIDER_SITE_OTHER): Payer: Medicare Other

## 2021-12-06 DIAGNOSIS — Z7901 Long term (current) use of anticoagulants: Secondary | ICD-10-CM

## 2021-12-06 LAB — POCT INR: INR: 1.7 — AB (ref 2.0–3.0)

## 2021-12-06 NOTE — Progress Notes (Signed)
Increase dose today to take 3 tablets and then change weekly dose to take 1 tablet daily except take 1 1/2 tablets on Mondays, Wednesdays and Fridays.  Recheck in 3 weeks.

## 2021-12-06 NOTE — Patient Instructions (Addendum)
Pre visit review using our clinic review tool, if applicable. No additional management support is needed unless otherwise documented below in the visit note.  Increase dose today to take 3 tablets and then change weekly dose to take 1 tablet daily except take 1 1/2 tablets on Mondays, Wednesdays and Fridays.  Recheck in 3 weeks.

## 2021-12-27 ENCOUNTER — Ambulatory Visit (INDEPENDENT_AMBULATORY_CARE_PROVIDER_SITE_OTHER): Payer: Medicare Other

## 2021-12-27 DIAGNOSIS — Z7901 Long term (current) use of anticoagulants: Secondary | ICD-10-CM

## 2021-12-27 LAB — POCT INR: INR: 3.7 — AB (ref 2.0–3.0)

## 2021-12-27 NOTE — Progress Notes (Addendum)
Hold dose today and then change weekly dose to take 1 tablet daily except take 1 1/2 tablets on Mondays and Thursdays.Recheck in 5 weeks per pt request due to transportation. ?

## 2021-12-27 NOTE — Patient Instructions (Addendum)
Pre visit review using our clinic review tool, if applicable. No additional management support is needed unless otherwise documented below in the visit note. ? ?Hold dose today and then change weekly dose to take 1 tablet daily except take 1 1/2 tablets on Mondays and Thursdays.Recheck in 5 weeks.  ?

## 2022-01-04 ENCOUNTER — Other Ambulatory Visit: Payer: Self-pay

## 2022-01-04 ENCOUNTER — Ambulatory Visit (INDEPENDENT_AMBULATORY_CARE_PROVIDER_SITE_OTHER): Payer: Medicare Other | Admitting: Ophthalmology

## 2022-01-04 ENCOUNTER — Encounter (INDEPENDENT_AMBULATORY_CARE_PROVIDER_SITE_OTHER): Payer: Self-pay | Admitting: Ophthalmology

## 2022-01-04 DIAGNOSIS — H353221 Exudative age-related macular degeneration, left eye, with active choroidal neovascularization: Secondary | ICD-10-CM | POA: Diagnosis not present

## 2022-01-04 DIAGNOSIS — H353114 Nonexudative age-related macular degeneration, right eye, advanced atrophic with subfoveal involvement: Secondary | ICD-10-CM

## 2022-01-04 MED ORDER — BEVACIZUMAB 2.5 MG/0.1ML IZ SOSY
2.5000 mg | PREFILLED_SYRINGE | INTRAVITREAL | Status: AC | PRN
  Administered 2022-01-04: 2.5 mg via INTRAVITREAL

## 2022-01-04 NOTE — Progress Notes (Signed)
? ? ?01/04/2022 ? ?  ? ?CHIEF COMPLAINT ?Patient presents for  ?Chief Complaint  ?Patient presents with  ? Macular Degeneration  ? ? ? ? ?HISTORY OF PRESENT ILLNESS: ?Mark Wu is a 88 y.o. male who presents to the clinic today for:  ? ?HPI   ?Follow-up today at 8 weeks post most recent evaluation and injection of antivegF left eye.  History Long history of subfoveal disciform scar chronically active OS with preserved acuity ? ?OD with history of disciform scar now atrophy limiting vision ? ?No interval change in vision in either eye ?Last edited by Hurman Horn, MD on 01/04/2022 10:33 AM.  ?  ? ? ?Referring physician: ?[name], MD ?[address] ?[place],  [place] [address] ? ?HISTORICAL INFORMATION:  ? ?Selected notes from the [facility] ?  ?   ? ?CURRENT MEDICATIONS: ?Current Outpatient Medications (Ophthalmic Drugs)  ?Medication Sig  ? latanoprost (XALATAN) 0.005 % ophthalmic solution Place 1 drop into the left eye at bedtime.  ? ?No current facility-administered medications for this visit. (Ophthalmic Drugs)  ? ?Current Outpatient Medications (Other)  ?Medication Sig  ? amLODipine (NORVASC) 5 MG tablet TAKE 1 TABLET DAILY (NEED PHYSICAL)  ? Calcium Carb-Cholecalciferol (CALCIUM CARBONATE-VITAMIN D3 PO) Take by mouth.  ? clotrimazole-betamethasone (LOTRISONE) cream APPLY TOPICALLY AS NEEDED  ? warfarin (COUMADIN) 2.5 MG tablet TAKE 1 TABLET DAILY EXCEPT TAKE 2 TABLETS ON MONDAYS OR TAKE AS DIRECTED BY ANTICOAGULATION CLINIC  ? ?No current facility-administered medications for this visit. (Other)  ? ? ? ? ?REVIEW OF SYSTEMS: ?ROS   ?Negative for: Constitutional, Gastrointestinal, Neurological, Skin, Genitourinary, Musculoskeletal, HENT, Endocrine, Cardiovascular, Eyes, Respiratory, Psychiatric, Allergic/Imm, Heme/Lymph ?Last edited by Hurman Horn, MD on 01/04/2022 10:28 AM.  ?  ? ? ? ?ALLERGIES ?No Known Allergies ? ?PAST MEDICAL HISTORY ?Past Medical History:  ?Diagnosis Date  ? Anal  fissure 06/11/2009  ? Atrial fibrillation ([place]) 06/11/2009  ? HYPERTENSION 06/11/2009  ? NECK MASS 10/01/2010  ? Prostate cancer ([place])   ? ?Past Surgical History:  ?Procedure Laterality Date  ? CARPAL TUNNEL RELEASE    ? Both hands  ? CYST REMOVAL HAND    ? Left hand  ? CYSTOSCOPY  2000  ? hand  ? PROSTATE BIOPSY    ? ? ?FAMILY HISTORY ?Family History  ?Problem Relation Age of Onset  ? Cancer Sister 38  ?     unknown type  ? Cancer Brother   ?     prostate/seed implant with [name]/died 2014/01/14 of heart attack  ? ? ?SOCIAL HISTORY ?Social History  ? ?Tobacco Use  ? Smoking status: Former  ?  Types: Cigars  ?  Quit date: 08/08/1991  ?  Years since quitting: 30.4  ? Smokeless tobacco: Never  ?Vaping Use  ? Vaping Use: Never used  ?Substance Use Topics  ? Alcohol use: No  ? Drug use: No  ? ?  ? ?  ? ?OPHTHALMIC EXAM: ? ?Base Eye Exam   ? ? Visual Acuity (ETDRS)   ? ?   Right Left  ? Dist cc CF at 3' 20/30 +2  ? ?  ?  ? ? Tonometry (Tonopen, 10:30 AM)   ? ?   Right Left  ? Pressure 6 9  ? ?  ?  ? ? Pupils   ? ?   Pupils APD  ? Right PERRL None  ? Left PERRL None  ? ?  ?  ? ? Visual Fields   ? ?  Left Right  ?  Full   ? Restrictions  Partial inner superior temporal, inferior temporal, superior nasal, inferior nasal deficiencies  ? ?  ?  ? ? Extraocular Movement   ? ?   Right Left  ?  Full, Ortho Full, Ortho  ? ?  ?  ? ? Neuro/Psych   ? ? Oriented x3: Yes  ? Mood/Affect: Normal  ? ?  ?  ? ? Dilation   ? ? Both eyes:   ? ?  ?  ? ?  ? ?Slit Lamp and Fundus Exam   ? ? External Exam   ? ?   Right Left  ? External Normal Normal  ? ?  ?  ? ? Slit Lamp Exam   ? ?   Right Left  ? Lids/Lashes Normal Normal  ? Conjunctiva/Sclera White and quiet White and quiet  ? Cornea Clear Clear  ? Anterior Chamber Deep and quiet Deep and quiet  ? Iris Round and reactive Round and reactive  ? Lens Centered posterior chamber intraocular lens Centered posterior chamber intraocular lens  ? Anterior Vitreous Normal Normal  ? ?  ?  ? ? Fundus Exam   ? ?    Right Left  ? Posterior Vitreous Posterior vitreous detachment Posterior vitreous detachment, Central vitreous floaters  ? Disc Normal Peripapillary atrophy  ? C/D Ratio 0.2 0.15  ? Macula Hard drusen, Geographic atrophy, Disciform scar Less yet persistent subretinal hemorrhage small and inferior  to the fovea,,, Disciform scar with large mature choroidal vessels as the new blood supply to the macula, stable for years, Retinal pigment epithelial atrophy, Geographic atrophy  ? Vessels Normal Normal  ? Periphery Normal Normal  ? ?  ?  ? ?  ? ? ?IMAGING AND PROCEDURES  ?Imaging and Procedures for 01/04/22 ? ?OCT, Retina - OU - Both Eyes   ? ?   ?Right Eye ?Quality was good. Scan locations included subfoveal. Central Foveal Thickness: 306. Progression has been stable. Findings include abnormal foveal contour, subretinal hyper-reflective material, subretinal scarring.  ? ?Left Eye ?Quality was borderline. Scan locations included subfoveal. Central Foveal Thickness: 377. Progression has been stable. Findings include subretinal scarring, choroidal neovascular membrane, disciform scar, abnormal foveal contour, no SRF, no IRF.  ? ?Notes ?OS, stabilize lesion nasal to the fovea, chronically active and yet only stable at 9  week  follow-up interval.  Repeat injection OS today to maintain and follow-up again in 8 to 9-week ? ?OD with massive permanent disciform fibrotic scar, stable overall ? ?  ? ?Intravitreal Injection, Pharmacologic Agent - OS - Left Eye   ? ?   ?Time Out ?01/04/2022. 11:26 AM. Confirmed correct patient, procedure, site, and patient consented.  ? ?Anesthesia ?Topical anesthesia was used. Anesthetic medications included Lidocaine 4%.  ? ?Procedure ?Preparation included Tobramycin 0.3%, 10% betadine to eyelids, 5% betadine to ocular surface, Ofloxacin . A 30 gauge needle was used.  ? ?Injection: ?2.5 mg bevacizumab 2.5 MG/0.1ML ?  Route: Intravitreal, Site: Left Eye ?  [place]: [id number], Lot: [id number]   ? ?Post-op ?Post injection exam found visual acuity of at least counting fingers. The patient tolerated the procedure well. There were no complications. The patient received written and verbal post procedure care education. Post injection medications included ocuflox.  ? ?  ? ? ?  ?  ? ?  ?ASSESSMENT/PLAN: ? ?Exudative age-related macular degeneration of left eye with active choroidal neovascularization ([place]) ?Stable overall at 8 weeks with subfoveal disciform scar  with recurrent activity with intraretinal fluid off of therapy.  Successful maintenance of vision now for 14 years of therapy OS ? ?Nonexudative age-related macular degeneration, right eye, advanced atrophic with subfoveal involvement ?Stable no active lesion edges over 14 years  ? ?  ICD-10-CM   ?1. Exudative age-related macular degeneration of left eye with active choroidal neovascularization (HCC)  H35.3221 OCT, Retina - OU - Both Eyes  ?  Intravitreal Injection, Pharmacologic Agent - OS - Left Eye  ?  bevacizumab (AVASTIN) SOSY 2.5 mg  ?  ?2. Nonexudative age-related macular degeneration, right eye, advanced atrophic with subfoveal involvement  H35.3114   ?  ? ? ?1.  OS stable overall, preserved acuity, at 8-week interval today post Avastin.  We will repeat injection today due to proven recurrences at her intervals of examination and this monocular patient ? ?2.  No active lesion edges OD ? ?3. ? ?Ophthalmic Meds Ordered this visit:  ?Meds ordered this encounter  ?Medications  ? bevacizumab (AVASTIN) SOSY 2.5 mg  ? ? ?  ? ?Return in about 8 weeks (around 03/01/2022) for DILATE OU, AVASTIN OCT, OS. ? ?There are no Patient Instructions on file for this visit. ? ? ?Explained the diagnoses, plan, and follow up with the patient and they expressed understanding.  Patient expressed understanding of the importance of proper follow up care.  ? ?Clent Demark. Suren Payne M.D. ?Diseases & Surgery of the Retina and Vitreous ?[place] ?01/04/22 ? ? ? ? ?Abbreviations: ?M myopia (nearsighted); A astigmatism; H hyperopia (farsighted); P presbyopia; Mrx spectacle prescription;  CTL contact lenses; OD right eye; OS left eye; OU both eyes  XT exotropia; ET esotropia;

## 2022-01-04 NOTE — Assessment & Plan Note (Signed)
Stable overall at 8 weeks with subfoveal disciform scar with recurrent activity with intraretinal fluid off of therapy.  Successful maintenance of vision now for 14 years of therapy OS ?

## 2022-01-04 NOTE — Assessment & Plan Note (Signed)
Stable no active lesion edges over 14 years ?

## 2022-01-05 ENCOUNTER — Encounter: Payer: Self-pay | Admitting: Family Medicine

## 2022-01-05 ENCOUNTER — Ambulatory Visit (INDEPENDENT_AMBULATORY_CARE_PROVIDER_SITE_OTHER): Payer: Medicare Other | Admitting: Family Medicine

## 2022-01-05 VITALS — BP 146/70 | HR 64 | Temp 97.5°F | Ht 69.0 in | Wt 160.4 lb

## 2022-01-05 DIAGNOSIS — R7401 Elevation of levels of liver transaminase levels: Secondary | ICD-10-CM

## 2022-01-05 DIAGNOSIS — R634 Abnormal weight loss: Secondary | ICD-10-CM

## 2022-01-05 DIAGNOSIS — R053 Chronic cough: Secondary | ICD-10-CM | POA: Diagnosis not present

## 2022-01-05 LAB — SEDIMENTATION RATE: Sed Rate: 18 mm/hr (ref 0–20)

## 2022-01-05 LAB — CBC WITH DIFFERENTIAL/PLATELET
Basophils Absolute: 0 10*3/uL (ref 0.0–0.1)
Basophils Relative: 0.3 % (ref 0.0–3.0)
Eosinophils Absolute: 0.1 10*3/uL (ref 0.0–0.7)
Eosinophils Relative: 1.5 % (ref 0.0–5.0)
HCT: 42.4 % (ref 39.0–52.0)
Hemoglobin: 14.6 g/dL (ref 13.0–17.0)
Lymphocytes Relative: 24.5 % (ref 12.0–46.0)
Lymphs Abs: 1 10*3/uL (ref 0.7–4.0)
MCHC: 34.6 g/dL (ref 30.0–36.0)
MCV: 87.8 fl (ref 78.0–100.0)
Monocytes Absolute: 0.5 10*3/uL (ref 0.1–1.0)
Monocytes Relative: 12 % (ref 3.0–12.0)
Neutro Abs: 2.6 10*3/uL (ref 1.4–7.7)
Neutrophils Relative %: 61.7 % (ref 43.0–77.0)
Platelets: 208 10*3/uL (ref 150.0–400.0)
RBC: 4.82 Mil/uL (ref 4.22–5.81)
RDW: 14.5 % (ref 11.5–15.5)
WBC: 4.2 10*3/uL (ref 4.0–10.5)

## 2022-01-05 LAB — COMPREHENSIVE METABOLIC PANEL
ALT: 24 U/L (ref 0–53)
AST: 40 U/L — ABNORMAL HIGH (ref 0–37)
Albumin: 4.3 g/dL (ref 3.5–5.2)
Alkaline Phosphatase: 184 U/L — ABNORMAL HIGH (ref 39–117)
BUN: 14 mg/dL (ref 6–23)
CO2: 26 mEq/L (ref 19–32)
Calcium: 9.5 mg/dL (ref 8.4–10.5)
Chloride: 100 mEq/L (ref 96–112)
Creatinine, Ser: 0.87 mg/dL (ref 0.40–1.50)
GFR: 74.44 mL/min (ref >60.00)
Glucose, Bld: 94 mg/dL (ref 70–99)
Potassium: 4.6 mEq/L (ref 3.5–5.1)
Sodium: 135 mEq/L (ref 135–145)
Total Bilirubin: 0.6 mg/dL (ref 0.2–1.2)
Total Protein: 7.1 g/dL (ref 6.0–8.3)

## 2022-01-05 LAB — TSH: TSH: 0.93 u[IU]/mL (ref 0.35–5.50)

## 2022-01-05 NOTE — Progress Notes (Signed)
? ?Established Patient Office Visit ? ?Subjective:  ?Patient ID: Mark Wu, male    DOB: 05-10-1926  Age: 88 y.o. MRN: [id number] ? ?CC:  ?Chief Complaint  ?Patient presents with  ? Weight Loss  ? Cough  ?  Patient complains of cough, x6 months  ? ? ?HPI ?Mark Wu presents for persistent cough for 6 months and some modest weight loss.  He has never smoked cigarettes.  He states his cough is usually worse in the morning and he usually coughs up some mucus but then settles down through the day.  No hemoptysis.  He had chest x-ray back in December which was unremarkable.  No dyspnea.  No chest pain.  No pleuritic pain. ? ?Weight is 160 pounds down from 167 back in September.  He usually eats a good breakfast with a light lunch and then a pretty decent dinner.  His wife thinks his appetite is relatively stable.  No reports of any headache, nausea, vomiting, diarrhea, abdominal pain. ? ?Past medical history is significant for hypertension, atrial fibrillation, GERD, prostate cancer.  He remains on Coumadin and amlodipine. ? ?Past Medical History:  ?Diagnosis Date  ? Anal fissure 06/11/2009  ? Atrial fibrillation ([place]) 06/11/2009  ? HYPERTENSION 06/11/2009  ? NECK MASS 10/01/2010  ? Prostate cancer ([place])   ? ? ?Past Surgical History:  ?Procedure Laterality Date  ? CARPAL TUNNEL RELEASE    ? Both hands  ? CYST REMOVAL HAND    ? Left hand  ? CYSTOSCOPY  2000  ? hand  ? PROSTATE BIOPSY    ? ? ?Family History  ?Problem Relation Age of Onset  ? Cancer Sister 34  ?     unknown type  ? Cancer Brother   ?     prostate/seed implant with [name]/died Jan 13, 2014 of heart attack  ? ? ?Social History  ? ?Socioeconomic History  ? Marital status: Married  ?  Spouse name: Not on file  ? Number of children: Not on file  ? Years of education: Not on file  ? Highest education level: Not on file  ?Occupational History  ? Occupation: retired  ?  Comment: retired from [employer] in 01-14-68  ?Tobacco Use  ? Smoking status: Former  ?  Types: Cigars  ?   Quit date: 08/08/1991  ?  Years since quitting: 30.4  ? Smokeless tobacco: Never  ?Vaping Use  ? Vaping Use: Never used  ?Substance and Sexual Activity  ? Alcohol use: No  ? Drug use: No  ? Sexual activity: Never  ?Other Topics Concern  ? Not on file  ?Social History Narrative  ? Resides in [place] (near [place], [place])  ? One story home  ? Occasionally caffeine  ? Right handed  ? ?Social Determinants of Health  ? ?Financial Resource Strain: Not on file  ?Food Insecurity: Not on file  ?Transportation Needs: Not on file  ?Physical Activity: Not on file  ?Stress: Not on file  ?Social Connections: Not on file  ?Intimate Partner Violence: Not on file  ? ? ?Outpatient Medications Prior to Visit  ?Medication Sig Dispense Refill  ? amLODipine (NORVASC) 5 MG tablet TAKE 1 TABLET DAILY (NEED PHYSICAL) 90 tablet 3  ? Calcium Carb-Cholecalciferol (CALCIUM CARBONATE-VITAMIN D3 PO) Take by mouth.    ? clotrimazole-betamethasone (LOTRISONE) cream APPLY TOPICALLY AS NEEDED 45 g 7  ? latanoprost (XALATAN) 0.005 % ophthalmic solution Place 1 drop into the left eye at bedtime. 4.5 mL 3  ? warfarin (COUMADIN) 2.5 MG  tablet TAKE 1 TABLET DAILY EXCEPT TAKE 2 TABLETS ON MONDAYS OR TAKE AS DIRECTED BY ANTICOAGULATION CLINIC 120 tablet 1  ? ?No facility-administered medications prior to visit.  ? ? ?No Known Allergies ? ?ROS ?Review of Systems  ?Constitutional:  Positive for unexpected weight change. Negative for activity change, appetite change, chills, fatigue and fever.  ?HENT:  Negative for congestion, ear pain, sore throat and trouble swallowing.   ?Eyes:  Negative for visual disturbance.  ?Respiratory:  Positive for cough. Negative for chest tightness, shortness of breath, wheezing and stridor.   ?Cardiovascular:  Negative for chest pain, palpitations and leg swelling.  ?Gastrointestinal:  Negative for abdominal pain.  ?Genitourinary:  Negative for dysuria.  ?Musculoskeletal:  Negative for arthralgias.  ?Skin:  Negative for rash.   ?Neurological:  Negative for dizziness, syncope, weakness, light-headedness and headaches.  ?Hematological:  Negative for adenopathy.  ? ?  ?Objective:  ?  ?Physical Exam ?Constitutional:   ?   Appearance: Normal appearance.  ?Cardiovascular:  ?   Rate and Rhythm: Normal rate.  ?Pulmonary:  ?   Effort: Pulmonary effort is normal.  ?   Breath sounds: Normal breath sounds.  ?Abdominal:  ?   Palpations: Abdomen is soft.  ?   Tenderness: There is no abdominal tenderness.  ?Musculoskeletal:  ?   Right lower leg: No edema.  ?   Left lower leg: No edema.  ?Neurological:  ?   Mental Status: He is alert.  ? ? ?BP (!) 146/70 (BP Location: Left Arm, Patient Position: Sitting, Cuff Size: Large)   Pulse 64   Temp (!) 97.5 ?F (36.4 ?C) (Oral)   Ht '5\' 9"'$  (1.753 m)   Wt 160 lb 6.4 oz (72.8 kg)   SpO2 97%   BMI 23.69 kg/m?  ?Wt Readings from Last 3 Encounters:  ?01/05/22 160 lb 6.4 oz (72.8 kg)  ?11/22/21 161 lb 9.6 oz (73.3 kg)  ?06/22/21 167 lb (75.8 kg)  ? ? ? ?Health Maintenance Due  ?Topic Date Due  ? TETANUS/TDAP  Never done  ? Zoster Vaccines- Shingrix (1 of 2) Never done  ? COVID-19 Vaccine (6 - Booster for Moderna series) 04/21/2021  ? ? ?There are no preventive care reminders to display for this patient. ? ?Lab Results  ?Component Value Date  ? TSH 1.018 08/16/2020  ? ?Lab Results  ?Component Value Date  ? WBC 7.6 09/25/2021  ? HGB 14.4 09/25/2021  ? HCT 43.6 09/25/2021  ? MCV 91.2 09/25/2021  ? PLT 249 09/25/2021  ? ?Lab Results  ?Component Value Date  ? NA 137 09/25/2021  ? K 4.2 09/25/2021  ? CO2 25 09/25/2021  ? GLUCOSE 106 (H) 09/25/2021  ? BUN 13 09/25/2021  ? CREATININE 0.90 09/25/2021  ? BILITOT 0.9 04/04/2017  ? ALKPHOS 45 04/04/2017  ? AST 17 04/04/2017  ? ALT 12 04/04/2017  ? PROT 6.6 04/04/2017  ? ALBUMIN 4.1 04/04/2017  ? CALCIUM 9.1 09/25/2021  ? ANIONGAP 7 09/25/2021  ? GFR 74.21 06/22/2021  ? ?Lab Results  ?Component Value Date  ? CHOL 142 09/04/2020  ? ?Lab Results  ?Component Value Date  ? HDL  36.10 (L) 09/04/2020  ? ?Lab Results  ?Component Value Date  ? [place] 83 09/04/2020  ? ?Lab Results  ?Component Value Date  ? TRIG 114.0 09/04/2020  ? ?Lab Results  ?Component Value Date  ? CHOLHDL 4 09/04/2020  ? ?No results found for: HGBA1C ? ?  ?Assessment & Plan:  ? ?Problem List Items  Addressed This Visit   ?None ?Visit Diagnoses   ? ? Chronic cough    -  Primary  ? Weight loss      ? Relevant Orders  ? CBC with Differential/Platelet  ? CMP  ? Sedimentation Rate  ? TSH  ? ?  ?Patient presents with 87-monthhistory of cough.  Chest x-ray back in late December was unremarkable.  No hemoptysis.  No clear appetite changes.  Modest weight loss as above. ? ?-We discussed differential for chronic cough.  No obvious reactive airway issues.  No postnasal drip symptoms.  Discussed possible silent GERD ? ?-Consider empiric trial of omeprazole or Nexium 20 mg once daily ? ?-Obtain screening labs as above ? ?-Set up 5-week follow-up.  If cough not improved at that time consider follow-up chest x-ray and further evaluation ? ?No orders of the defined types were placed in this encounter. ? ? ?Follow-up: Return in about 5 weeks (around 02/09/2022).  ? ? ?BCarolann Littler MD ?

## 2022-01-05 NOTE — Patient Instructions (Signed)
Get over the counter Nexium 20 mg or Prilosec 20 mg and take one daily.  ?

## 2022-01-07 ENCOUNTER — Telehealth: Payer: Self-pay | Admitting: Family Medicine

## 2022-01-07 NOTE — Telephone Encounter (Signed)
Spoke with patient to  schedule Medicare Annual Wellness Visit (AWV) either virtually or in office.  ? ?Patient stated he has a lot going on  wanted a call back in end April first of may  ? ?Last AWV ;12/19/17 ? please schedule at anytime with [facility] Nurse Health Advisor 1 or 2 ? ? ? ?

## 2022-01-10 NOTE — Addendum Note (Signed)
Addended by: Nilda Riggs on: 01/10/2022 01:14 PM ? ? Modules accepted: Orders ? ?

## 2022-01-31 ENCOUNTER — Ambulatory Visit (INDEPENDENT_AMBULATORY_CARE_PROVIDER_SITE_OTHER): Payer: Medicare Other

## 2022-01-31 DIAGNOSIS — Z7901 Long term (current) use of anticoagulants: Secondary | ICD-10-CM

## 2022-01-31 LAB — POCT INR: INR: 2.8 (ref 2.0–3.0)

## 2022-01-31 NOTE — Patient Instructions (Addendum)
Pre visit review using our clinic review tool, if applicable. No additional management support is needed unless otherwise documented below in the visit note. ? ?Continue 1 tablet daily except take 1 1/2 tablets on Mondays and Thursdays.Recheck in 6 weeks per pt request due to transportation. ?

## 2022-01-31 NOTE — Progress Notes (Signed)
Continue 1 tablet daily except take 1 1/2 tablets on Mondays and Thursdays.Recheck in 6 weeks per pt request due to transportation. ?

## 2022-02-02 ENCOUNTER — Telehealth: Payer: Self-pay | Admitting: Family Medicine

## 2022-02-02 NOTE — Telephone Encounter (Signed)
Left message for patient to call back and schedule Medicare Annual Wellness Visit (AWV) either virtually or in office. Left  my Herbie Drape number [phone] ? ? ?Last AWV 12/19/17 ? please schedule at anytime with [facility] Nurse Health Advisor 1 or 2 ? ? ? ?

## 2022-02-09 ENCOUNTER — Encounter: Payer: Self-pay | Admitting: Family Medicine

## 2022-02-09 ENCOUNTER — Ambulatory Visit: Payer: Medicare Other | Admitting: Family Medicine

## 2022-02-09 ENCOUNTER — Ambulatory Visit (INDEPENDENT_AMBULATORY_CARE_PROVIDER_SITE_OTHER): Payer: Medicare Other

## 2022-02-09 ENCOUNTER — Ambulatory Visit (INDEPENDENT_AMBULATORY_CARE_PROVIDER_SITE_OTHER): Payer: Medicare Other | Admitting: Family Medicine

## 2022-02-09 VITALS — BP 160/70 | HR 65 | Temp 97.4°F | Ht 69.0 in | Wt 158.4 lb

## 2022-02-09 DIAGNOSIS — R7401 Elevation of levels of liver transaminase levels: Secondary | ICD-10-CM

## 2022-02-09 DIAGNOSIS — R053 Chronic cough: Secondary | ICD-10-CM

## 2022-02-09 DIAGNOSIS — R634 Abnormal weight loss: Secondary | ICD-10-CM | POA: Diagnosis not present

## 2022-02-09 DIAGNOSIS — R059 Cough, unspecified: Secondary | ICD-10-CM | POA: Diagnosis not present

## 2022-02-09 LAB — HEPATIC FUNCTION PANEL
ALT: 18 U/L (ref 0–53)
AST: 30 U/L (ref 0–37)
Albumin: 4.4 g/dL (ref 3.5–5.2)
Alkaline Phosphatase: 208 U/L — ABNORMAL HIGH (ref 39–117)
Bilirubin, Direct: 0.2 mg/dL (ref 0.0–0.3)
Total Bilirubin: 0.7 mg/dL (ref 0.2–1.2)
Total Protein: 7.6 g/dL (ref 6.0–8.3)

## 2022-02-09 NOTE — Progress Notes (Signed)
? ?Established Patient Office Visit ? ?Subjective   ?Patient ID: Mark Wu, male    DOB: 11-11-1925  Age: 88 y.o. MRN: [id number] ? ?Chief Complaint  ?Patient presents with  ? Follow-up  ? ? ?HPI ? ? ?Patient here today accompanied by wife.  His chronic problems include history of hypertension, prostate cancer, glaucoma, atrial fibrillation on chronic Coumadin ? ?Refer to previous note for details.  Was seen with some chronic cough.  Had chest x-ray in December which was unremarkable.  Lost about 7 pounds.  Obtain lab work.  He had elevated AST of 40 and also mildly elevated alkaline phosphatase 184.  Denies any alcohol whatsoever.  No abdominal pain.  No nausea or vomiting.  Has been taking PPI and cough is some improved though wife thinks he is still coughing daily.  No hemoptysis. ? ?Past Medical History:  ?Diagnosis Date  ? Anal fissure 06/11/2009  ? Atrial fibrillation ([place]) 06/11/2009  ? HYPERTENSION 06/11/2009  ? NECK MASS 10/01/2010  ? Prostate cancer ([place])   ? ?Past Surgical History:  ?Procedure Laterality Date  ? CARPAL TUNNEL RELEASE    ? Both hands  ? CYST REMOVAL HAND    ? Left hand  ? CYSTOSCOPY  2000  ? hand  ? PROSTATE BIOPSY    ? ? reports that he quit smoking about 30 years ago. His smoking use included cigars. He has never used smokeless tobacco. He reports that he does not drink alcohol and does not use drugs. ?family history includes Cancer in his brother; Cancer (age of onset: 18) in his sister. ?No Known Allergies ? ? ?Review of Systems  ?Constitutional:  Positive for weight loss. Negative for chills, diaphoresis and fever.  ?HENT:    ?     No swallowing difficulties.  ?Respiratory:  Positive for cough. Negative for hemoptysis, sputum production and wheezing.   ?Cardiovascular:  Negative for chest pain.  ?Gastrointestinal:  Negative for abdominal pain, constipation, diarrhea, nausea and vomiting.  ? ?  ?Objective:  ?  ? ?BP (!) 160/70 (BP Location: Left Arm, Patient Position: Sitting, Cuff  Size: Normal)   Pulse 65   Temp (!) 97.4 ?F (36.3 ?C) (Oral)   Ht '5\' 9"'$  (1.753 m)   Wt 158 lb 6.4 oz (71.8 kg)   SpO2 96%   BMI 23.39 kg/m?  ?Wt Readings from Last 3 Encounters:  ?02/09/22 158 lb 6.4 oz (71.8 kg)  ?01/05/22 160 lb 6.4 oz (72.8 kg)  ?11/22/21 161 lb 9.6 oz (73.3 kg)  ? ?  ? ?Physical Exam ?Vitals reviewed.  ?Constitutional:   ?   Appearance: Normal appearance.  ?Cardiovascular:  ?   Rate and Rhythm: Normal rate.  ?Pulmonary:  ?   Effort: Pulmonary effort is normal.  ?   Breath sounds: Normal breath sounds.  ?Abdominal:  ?   Palpations: Abdomen is soft. There is no mass.  ?   Tenderness: There is no abdominal tenderness.  ?Neurological:  ?   Mental Status: He is alert.  ? ? ? ?No results found for any visits on 02/09/22. ? ?Last CBC ?Lab Results  ?Component Value Date  ? WBC 4.2 01/05/2022  ? HGB 14.6 01/05/2022  ? HCT 42.4 01/05/2022  ? MCV 87.8 01/05/2022  ? MCH 30.1 09/25/2021  ? RDW 14.5 01/05/2022  ? PLT 208.0 01/05/2022  ? ?Last metabolic panel ?Lab Results  ?Component Value Date  ? GLUCOSE 94 01/05/2022  ? NA 135 01/05/2022  ? K 4.6 01/05/2022  ?  CL 100 01/05/2022  ? CO2 26 01/05/2022  ? BUN 14 01/05/2022  ? CREATININE 0.87 01/05/2022  ? GFRNONAA >60 09/25/2021  ? CALCIUM 9.5 01/05/2022  ? PROT 7.1 01/05/2022  ? ALBUMIN 4.3 01/05/2022  ? BILITOT 0.6 01/05/2022  ? ALKPHOS 184 (H) 01/05/2022  ? AST 40 (H) 01/05/2022  ? ALT 24 01/05/2022  ? ANIONGAP 7 09/25/2021  ? ?Last thyroid functions ?Lab Results  ?Component Value Date  ? TSH 0.93 01/05/2022  ? ?  ? ?The ASCVD Risk score (Arnett DK, et al., 2019) failed to calculate for the following reasons: ?  The 2019 ASCVD risk score is only valid for ages 52 to 19 ? ?  ?Assessment & Plan:  ? ?Problem List Items Addressed This Visit   ?None ?Visit Diagnoses   ? ? Chronic cough    -  Primary  ? Relevant Orders  ? DG Chest 2 View  ? Elevated liver transaminase level      ? Relevant Orders  ? Hepatic function panel  ? ?  ?Patient has chronic cough for  several weeks.  Does have some modest weight loss of uncertain significance.  Cough perhaps slightly improved with PPI use.  Nonfocal exam. ?-Obtain PA and lateral chest x-ray to further assess ? ?Mildly elevated AST in the absence of any alcohol use.  Also had mildly elevated alkaline phosphatase ?-Repeat hepatic panel ?-Consider abdominal imaging if enzymes remain elevated especially in view of recent unintentional weight loss ? ?No follow-ups on file.  ? ? ?[name], MD ? ?

## 2022-03-01 ENCOUNTER — Ambulatory Visit (INDEPENDENT_AMBULATORY_CARE_PROVIDER_SITE_OTHER): Payer: Medicare Other | Admitting: Ophthalmology

## 2022-03-01 ENCOUNTER — Encounter (INDEPENDENT_AMBULATORY_CARE_PROVIDER_SITE_OTHER): Payer: Self-pay | Admitting: Ophthalmology

## 2022-03-01 DIAGNOSIS — H353221 Exudative age-related macular degeneration, left eye, with active choroidal neovascularization: Secondary | ICD-10-CM | POA: Diagnosis not present

## 2022-03-01 DIAGNOSIS — H353114 Nonexudative age-related macular degeneration, right eye, advanced atrophic with subfoveal involvement: Secondary | ICD-10-CM | POA: Diagnosis not present

## 2022-03-01 DIAGNOSIS — H353212 Exudative age-related macular degeneration, right eye, with inactive choroidal neovascularization: Secondary | ICD-10-CM

## 2022-03-01 MED ORDER — BEVACIZUMAB 2.5 MG/0.1ML IZ SOSY
2.5000 mg | PREFILLED_SYRINGE | INTRAVITREAL | Status: AC | PRN
  Administered 2022-03-01: 2.5 mg via INTRAVITREAL

## 2022-03-01 NOTE — Assessment & Plan Note (Signed)
OD with preserved paramacular RPE, despite foveal loss.  May be candidate for Syfovre in the future. ?

## 2022-03-01 NOTE — Assessment & Plan Note (Signed)
Chronic active subfoveal disciform scar with intraretinal hemorrhage maintained in good acuity at 8-week interval post Avastin.  Repeat injection today ?

## 2022-03-01 NOTE — Progress Notes (Signed)
? ? ?03/01/2022 ? ?  ? ?CHIEF COMPLAINT ?Patient presents for  ?Chief Complaint  ?Patient presents with  ? Macular Degeneration  ? ? ? ? ?HISTORY OF PRESENT ILLNESS: ?Mark Wu is a 88 y.o. male who presents to the clinic today for:  ? ?HPI   ?8 weeks dilate OU, Avastin OS, OCT. ?Patient states vision is stable and unchanged since last visit. Denies any new floaters or FOL. ? ?Last edited by Laurin Coder on 03/01/2022 10:08 AM.  ?  ? ? ?Referring physician: ?[name], MD ?[facility] ?[place],  [place] [address] ? ?HISTORICAL INFORMATION:  ? ?Selected notes from the [facility] ?  ?   ? ?CURRENT MEDICATIONS: ?No current outpatient medications on file. (Ophthalmic Drugs)  ? ?No current facility-administered medications for this visit. (Ophthalmic Drugs)  ? ?Current Outpatient Medications (Other)  ?Medication Sig  ? amLODipine (NORVASC) 5 MG tablet TAKE 1 TABLET DAILY (NEED PHYSICAL)  ? Calcium Carb-Cholecalciferol (CALCIUM CARBONATE-VITAMIN D3 PO) Take by mouth.  ? clotrimazole-betamethasone (LOTRISONE) cream APPLY TOPICALLY AS NEEDED  ? warfarin (COUMADIN) 2.5 MG tablet TAKE 1 TABLET DAILY EXCEPT TAKE 2 TABLETS ON MONDAYS OR TAKE AS DIRECTED BY ANTICOAGULATION CLINIC  ? ?No current facility-administered medications for this visit. (Other)  ? ? ? ? ?REVIEW OF SYSTEMS: ?ROS   ?Negative for: Constitutional, Gastrointestinal, Neurological, Skin, Genitourinary, Musculoskeletal, HENT, Endocrine, Cardiovascular, Eyes, Respiratory, Psychiatric, Allergic/Imm, Heme/Lymph ?Last edited by Hurman Horn, MD on 03/01/2022 11:04 AM.  ?  ? ? ? ?ALLERGIES ?No Known Allergies ? ?PAST MEDICAL HISTORY ?Past Medical History:  ?Diagnosis Date  ? Anal fissure 06/11/2009  ? Atrial fibrillation ([place]) 06/11/2009  ? HYPERTENSION 06/11/2009  ? NECK MASS 10/01/2010  ? Prostate cancer ([place])   ? ?Past Surgical History:  ?Procedure Laterality Date  ? CARPAL TUNNEL RELEASE    ? Both hands  ? CYST REMOVAL HAND    ? Left hand   ? CYSTOSCOPY  2000  ? hand  ? PROSTATE BIOPSY    ? ? ?FAMILY HISTORY ?Family History  ?Problem Relation Age of Onset  ? Cancer Sister 91  ?     unknown type  ? Cancer Brother   ?     prostate/seed implant with [name]/died 2014-01-25 of heart attack  ? ? ?SOCIAL HISTORY ?Social History  ? ?Tobacco Use  ? Smoking status: Former  ?  Types: Cigars  ?  Quit date: 08/08/1991  ?  Years since quitting: 30.5  ? Smokeless tobacco: Never  ?Vaping Use  ? Vaping Use: Never used  ?Substance Use Topics  ? Alcohol use: No  ? Drug use: No  ? ?  ? ?  ? ?OPHTHALMIC EXAM: ? ?Base Eye Exam   ? ? Visual Acuity (ETDRS)   ? ?   Right Left  ? Dist cc CF at 3' 20/30  ? Dist ph cc NI NI  ? ? Correction: Glasses  ? ?  ?  ? ? Tonometry (Tonopen, 10:11 AM)   ? ?   Right Left  ? Pressure 10 13  ? ?  ?  ? ? Pupils   ? ?   Pupils Dark Light APD  ? Right PERRL 5 4 None  ? Left PERRL 5 4 None  ? ?  ?  ? ? Extraocular Movement   ? ?   Right Left  ?  Full Full  ? ?  ?  ? ? Neuro/Psych   ? ? Oriented  x3: Yes  ? Mood/Affect: Normal  ? ?  ?  ? ? Dilation   ? ? Both eyes: 1.0% Mydriacyl, 2.5% Phenylephrine @ 10:11 AM  ? ?  ?  ? ?  ? ?Slit Lamp and Fundus Exam   ? ? External Exam   ? ?   Right Left  ? External Normal Normal  ? ?  ?  ? ? Slit Lamp Exam   ? ?   Right Left  ? Lids/Lashes Normal Normal  ? Conjunctiva/Sclera White and quiet White and quiet  ? Cornea Clear Clear  ? Anterior Chamber Deep and quiet Deep and quiet  ? Iris Round and reactive Round and reactive  ? Lens Centered posterior chamber intraocular lens Centered posterior chamber intraocular lens  ? Anterior Vitreous Normal Normal  ? ?  ?  ? ? Fundus Exam   ? ?   Right Left  ? Posterior Vitreous Posterior vitreous detachment Posterior vitreous detachment, Central vitreous floaters  ? Disc Normal Peripapillary atrophy  ? C/D Ratio 0.2 0.15  ? Macula Hard drusen, Geographic atrophy into the FAZ yet someparaMacular RPE remains, Disciform scar Less yet persistent subretinal hemorrhage small and  inferior  to the fovea, Disciform scar with large mature choroidal vessels as the new blood supply to the macula, stable for years, Retinal pigment epithelial atrophy, Geographic atrophy perifoveal  ? Vessels Normal Normal  ? Periphery Normal Normal  ? ?  ?  ? ?  ? ? ?IMAGING AND PROCEDURES  ?Imaging and Procedures for 03/01/22 ? ?OCT, Retina - OU - Both Eyes   ? ?   ?Right Eye ?Quality was good. Scan locations included subfoveal. Central Foveal Thickness: 306. Progression has been stable. Findings include abnormal foveal contour, subretinal hyper-reflective material, subretinal scarring.  ? ?Left Eye ?Quality was borderline. Scan locations included subfoveal. Central Foveal Thickness: 377. Progression has been stable. Findings include subretinal scarring, choroidal neovascular membrane, disciform scar, abnormal foveal contour, no SRF, no IRF.  ? ?Notes ?OS, stabilize lesion nasal to the fovea, chronically active and yet only stable at 8  week  follow-up interval.  Repeat injection OS today to maintain and follow-up again in 8 -week ? ?OD with massive permanent disciform fibrotic scar, stable overall ? ?  ? ?Intravitreal Injection, Pharmacologic Agent - OS - Left Eye   ? ?   ?Time Out ?03/01/2022. 11:06 AM. Confirmed correct patient, procedure, site, and patient consented.  ? ?Anesthesia ?Topical anesthesia was used. Anesthetic medications included Lidocaine 4%.  ? ?Procedure ?Preparation included Tobramycin 0.3%, 10% betadine to eyelids, 5% betadine to ocular surface, Ofloxacin . A 30 gauge needle was used.  ? ?Injection: ?2.5 mg bevacizumab 2.5 MG/0.1ML ?  Route: Intravitreal, Site: Left Eye ?  [place]: [id number], Lot: [id number]  ? ?Post-op ?Post injection exam found visual acuity of at least counting fingers. The patient tolerated the procedure well. There were no complications. The patient received written and verbal post procedure care education. Post injection medications included ocuflox.  ? ?  ? ? ?  ?  ? ?   ?ASSESSMENT/PLAN: ? ?Nonexudative age-related macular degeneration, right eye, advanced atrophic with subfoveal involvement ?OD with preserved paramacular RPE, despite foveal loss.  May be candidate for Syfovre in the future. ? ?Exudative age-related macular degeneration of right eye with inactive choroidal neovascularization ([place]) ?No sign of CNVM OD today ? ?Exudative age-related macular degeneration of left eye with active choroidal neovascularization ([place]) ?Chronic active subfoveal disciform scar with intraretinal hemorrhage maintained  in good acuity at 8-week interval post Avastin.  Repeat injection today ?  ? ?  ICD-10-CM   ?1. Exudative age-related macular degeneration of left eye with active choroidal neovascularization (HCC)  H35.3221 OCT, Retina - OU - Both Eyes  ?  Intravitreal Injection, Pharmacologic Agent - OS - Left Eye  ?  bevacizumab (AVASTIN) SOSY 2.5 mg  ?  ?2. Nonexudative age-related macular degeneration, right eye, advanced atrophic with subfoveal involvement  H35.3114   ?  ?3. Exudative age-related macular degeneration of right eye with inactive choroidal neovascularization ([place])  J68.1157   ?  ? ? ?1.  OS with history of chronic active subfoveal disciform scar yet with preserved acuity.  Intraretinal hemorrhage inferiorly suggest ongoing chronic disease.  Repeat injection Avastin today to maintain vision in this monocular patient. ? ?2.  Subfoveal RPE atrophy OD and perifoveal RPE atrophy OS, patient may be candidate for Syfovre in the future ? ?3.  Dilate OU next, OCT OU and FFA L/R next ? ?Ophthalmic Meds Ordered this visit:  ?Meds ordered this encounter  ?Medications  ? bevacizumab (AVASTIN) SOSY 2.5 mg  ? ? ?  ? ?Return in about 8 weeks (around 04/26/2022) for DILATE OU, COLOR FP, OPTOS FFA L/R, AVASTIN OCT, OS. ? ?There are no Patient Instructions on file for this visit. ? ? ?Explained the diagnoses, plan, and follow up with the patient and they expressed understanding.  Patient  expressed understanding of the importance of proper follow up care.  ? ?Clent Demark. Jerika Wales M.D. ?Diseases & Surgery of the Retina and Vitreous ?[facility] ?03/01/22 ? ? ? ? ?Abbreviations: ?M myopia (nea

## 2022-03-01 NOTE — Assessment & Plan Note (Signed)
No sign of CNVM OD today °

## 2022-03-03 ENCOUNTER — Telehealth: Payer: Self-pay | Admitting: Family Medicine

## 2022-03-03 NOTE — Telephone Encounter (Signed)
Left message for patient to call back and schedule Medicare Annual Wellness Visit (AWV) either virtually or in office. Left  my Herbie Drape number [phone]   Last AWV 12/19/17 ; please schedule at anytime with [facility] Nurse Health Advisor 1 or 2

## 2022-03-10 ENCOUNTER — Other Ambulatory Visit: Payer: Self-pay | Admitting: Family Medicine

## 2022-03-16 ENCOUNTER — Ambulatory Visit (INDEPENDENT_AMBULATORY_CARE_PROVIDER_SITE_OTHER): Payer: Medicare Other | Admitting: Family Medicine

## 2022-03-16 ENCOUNTER — Encounter: Payer: Self-pay | Admitting: Family Medicine

## 2022-03-16 ENCOUNTER — Ambulatory Visit (INDEPENDENT_AMBULATORY_CARE_PROVIDER_SITE_OTHER): Payer: Medicare Other

## 2022-03-16 ENCOUNTER — Ambulatory Visit: Payer: Medicare Other

## 2022-03-16 VITALS — BP 140/70 | HR 65 | Temp 97.9°F | Ht 69.0 in | Wt 157.4 lb

## 2022-03-16 DIAGNOSIS — R748 Abnormal levels of other serum enzymes: Secondary | ICD-10-CM

## 2022-03-16 DIAGNOSIS — I4891 Unspecified atrial fibrillation: Secondary | ICD-10-CM | POA: Diagnosis not present

## 2022-03-16 DIAGNOSIS — R634 Abnormal weight loss: Secondary | ICD-10-CM | POA: Diagnosis not present

## 2022-03-16 DIAGNOSIS — I10 Essential (primary) hypertension: Secondary | ICD-10-CM | POA: Diagnosis not present

## 2022-03-16 DIAGNOSIS — Z7901 Long term (current) use of anticoagulants: Secondary | ICD-10-CM | POA: Diagnosis not present

## 2022-03-16 LAB — POCT INR: INR: 1.9 — AB (ref 2.0–3.0)

## 2022-03-16 NOTE — Progress Notes (Signed)
Established Patient Office Visit  Subjective   Patient ID: Mark Wu, male    DOB: 1926-01-20  Age: 88 y.o. MRN: [id number]  Chief Complaint  Patient presents with   Follow-up    HPI   Mark Wu has history of hypertension, atrial fibrillation, prostate cancer, glaucoma, macular degeneration.  Recently acquired hearing aids from the [place] but is still having some hearing challenges.  At that some chronic mild cough and chest x-ray last visit was unremarkable.  We suspect possible silent GERD trigger.  He is on PPI and noticed slight improvement.  No hemoptysis.  Recent weight loss.  He states his appetite is good.  Weight seems to have stabilized since last visit.  Recent elevated liver transaminase with mild AST elevation.  No alcohol use.  Elevated alkaline phosphatase.  Denies any abdominal pain.  Does have history of prostate cancer but reportedly confined to the prostate gland.  Bone scan 2018 showed no bony metastases.  He sees urologist regularly and states that PSAs have been very low.  We had recommended follow-up now to check 5 prime nucleotidase or gamma-glutamyl transpeptidase to further assess his alkaline phosphatase.  Recent sed rate 18.  Recent TSH normal.  Recent CBC normal.  Past Medical History:  Diagnosis Date   Anal fissure 06/11/2009   Atrial fibrillation ([place]) 06/11/2009   HYPERTENSION 06/11/2009   NECK MASS 10/01/2010   Prostate cancer [facility])    Past Surgical History:  Procedure Laterality Date   CARPAL TUNNEL RELEASE     Both hands   CYST REMOVAL HAND     Left hand   CYSTOSCOPY  2000   hand   PROSTATE BIOPSY      reports that he quit smoking about 30 years ago. His smoking use included cigars. He has never used smokeless tobacco. He reports that he does not drink alcohol and does not use drugs. family history includes Cancer in his brother; Cancer (age of onset: 31) in his sister. No Known Allergies  Review of Systems  Constitutional:  Negative for  chills and fever.  Respiratory:  Positive for cough. Negative for shortness of breath and wheezing.   Cardiovascular:  Negative for chest pain.  Gastrointestinal:  Negative for abdominal pain, nausea and vomiting.  Genitourinary:  Negative for dysuria.     Objective:     BP 140/70 (BP Location: Left Arm, Patient Position: Sitting, Cuff Size: Normal)   Pulse 65   Temp 97.9 F (36.6 C) (Oral)   Ht '5\' 9"'$  (1.753 m)   Wt 157 lb 6.4 oz (71.4 kg)   SpO2 98%   BMI 23.24 kg/m  BP Readings from Last 3 Encounters:  03/16/22 140/70  02/09/22 (!) 160/70  01/05/22 (!) 146/70   Wt Readings from Last 3 Encounters:  03/16/22 157 lb 6.4 oz (71.4 kg)  02/09/22 158 lb 6.4 oz (71.8 kg)  01/05/22 160 lb 6.4 oz (72.8 kg)      Physical Exam Vitals reviewed.  Constitutional:      Appearance: Normal appearance.  Cardiovascular:     Rate and Rhythm: Normal rate.     Comments: Irregular rhythm Musculoskeletal:     Comments: Trace edema lower legs bilaterally.  Neurological:     Mental Status: He is alert.     No results found for any visits on 03/16/22.  Last CBC Lab Results  Component Value Date   WBC 4.2 01/05/2022   HGB 14.6 01/05/2022   HCT 42.4 01/05/2022   MCV  87.8 01/05/2022   MCH 30.1 09/25/2021   RDW 14.5 01/05/2022   PLT 208.0 19/75/8832   Last metabolic panel Lab Results  Component Value Date   GLUCOSE 94 01/05/2022   NA 135 01/05/2022   K 4.6 01/05/2022   CL 100 01/05/2022   CO2 26 01/05/2022   BUN 14 01/05/2022   CREATININE 0.87 01/05/2022   GFRNONAA >60 09/25/2021   CALCIUM 9.5 01/05/2022   PROT 7.6 02/09/2022   ALBUMIN 4.4 02/09/2022   BILITOT 0.7 02/09/2022   ALKPHOS 208 (H) 02/09/2022   AST 30 02/09/2022   ALT 18 02/09/2022   ANIONGAP 7 09/25/2021   Last lipids Lab Results  Component Value Date   CHOL 142 09/04/2020   HDL 36.10 (L) 09/04/2020   LDLCALC 83 09/04/2020   TRIG 114.0 09/04/2020   CHOLHDL 4 09/04/2020   Last thyroid functions Lab  Results  Component Value Date   TSH 0.93 01/05/2022      The ASCVD Risk score (Arnett DK, et al., 2019) failed to calculate for the following reasons:   The 2019 ASCVD risk score is only valid for ages 27 to 65    Assessment & Plan:   #1 recent mild elevated alkaline phosphatase.  Elevation persisted with fasting labs.  Question bone versus liver source.  We will check 5 prime nucleotidase to further assess.  If elevated consider abdominal ultrasound  #2 chronic atrial fibrillation.  Rate stable.  Patient on Coumadin.  Follow-up with Coumadin clinic later today.  #3 hypertension stable.  Continue amlodipine  #4 recent weight loss.  Weight seems to have stabilized some.  Continue regular 3 times daily meals and healthy snacks.   Return in about 3 months (around 06/16/2022).    [name], MD

## 2022-03-16 NOTE — Patient Instructions (Addendum)
Pre visit review using our clinic review tool, if applicable. No additional management support is needed unless otherwise documented below in the visit note.  Increase dose to take 1 1/2 tablets and then continue 1 tablet daily except take 1 1/2 tablets on Mondays and Thursdays.Recheck in 6 weeks

## 2022-03-16 NOTE — Progress Notes (Unsigned)
Increase dose to take 1 1/2 tablets and then continue 1 tablet daily except take 1 1/2 tablets on Mondays and Thursdays.Recheck in 6 weeks per pt request due to transportation.

## 2022-03-23 LAB — NUCLEOTIDASE, 5', BLOOD: 5-Nucleotidase: 5 U/L (ref 0–10)

## 2022-03-23 NOTE — Addendum Note (Signed)
Addended by: [name] on: 03/23/2022 05:47 PM   Modules accepted: Orders

## 2022-03-25 NOTE — Addendum Note (Signed)
Addended by: [name] on: 03/25/2022 06:55 AM   Modules accepted: Orders

## 2022-03-30 ENCOUNTER — Ambulatory Visit: Payer: Medicare Other

## 2022-04-13 ENCOUNTER — Other Ambulatory Visit (INDEPENDENT_AMBULATORY_CARE_PROVIDER_SITE_OTHER): Payer: Self-pay | Admitting: Ophthalmology

## 2022-04-14 DIAGNOSIS — C61 Malignant neoplasm of prostate: Secondary | ICD-10-CM | POA: Diagnosis not present

## 2022-04-18 DIAGNOSIS — Z85828 Personal history of other malignant neoplasm of skin: Secondary | ICD-10-CM | POA: Diagnosis not present

## 2022-04-18 DIAGNOSIS — L570 Actinic keratosis: Secondary | ICD-10-CM | POA: Diagnosis not present

## 2022-04-18 DIAGNOSIS — D1801 Hemangioma of skin and subcutaneous tissue: Secondary | ICD-10-CM | POA: Diagnosis not present

## 2022-04-18 DIAGNOSIS — L821 Other seborrheic keratosis: Secondary | ICD-10-CM | POA: Diagnosis not present

## 2022-04-21 DIAGNOSIS — C61 Malignant neoplasm of prostate: Secondary | ICD-10-CM | POA: Diagnosis not present

## 2022-04-25 ENCOUNTER — Telehealth: Payer: Self-pay

## 2022-04-25 ENCOUNTER — Ambulatory Visit (INDEPENDENT_AMBULATORY_CARE_PROVIDER_SITE_OTHER): Payer: Medicare Other

## 2022-04-25 DIAGNOSIS — Z7901 Long term (current) use of anticoagulants: Secondary | ICD-10-CM

## 2022-04-25 LAB — POCT INR: INR: 1.5 — AB (ref 2.0–3.0)

## 2022-04-25 NOTE — Progress Notes (Addendum)
Increase dose to take 2 tablets and increase dose tomorrow to take 1 1/2 tablets and then change weekly dose to take  1 1/2  tablets daily except take 1 tablets on Sundays, Tuesdays and Thursdays. Recheck in 3 weeks per pt request due to transportation difficulties.

## 2022-04-25 NOTE — Telephone Encounter (Signed)
Pt was in today for INR check and was discussing his last labs and that he was to have a bone scan but was never contacted. Advised pt a msg will be sent for someone to f/u on this. Advised if pt did not hear from the clinic within one week to contact the office. Pt appreciative and verbalized understanding.

## 2022-04-25 NOTE — Patient Instructions (Addendum)
Pre visit review using our clinic review tool, if applicable. No additional management support is needed unless otherwise documented below in the visit note.  Increase dose to take 2 tablets and increase dose tomorrow to take 1 1/2 tablets and then change weekly dose to take  1 1/2  tablets daily except take 1 tablets on Sundays, Tuesdays and Thursdays. Recheck in 3 weeks.

## 2022-04-26 ENCOUNTER — Encounter (INDEPENDENT_AMBULATORY_CARE_PROVIDER_SITE_OTHER): Payer: Self-pay | Admitting: Ophthalmology

## 2022-04-26 ENCOUNTER — Ambulatory Visit (INDEPENDENT_AMBULATORY_CARE_PROVIDER_SITE_OTHER): Payer: Medicare Other | Admitting: Ophthalmology

## 2022-04-26 DIAGNOSIS — H353221 Exudative age-related macular degeneration, left eye, with active choroidal neovascularization: Secondary | ICD-10-CM | POA: Diagnosis not present

## 2022-04-26 DIAGNOSIS — H353212 Exudative age-related macular degeneration, right eye, with inactive choroidal neovascularization: Secondary | ICD-10-CM

## 2022-04-26 MED ORDER — BEVACIZUMAB 2.5 MG/0.1ML IZ SOSY
2.5000 mg | PREFILLED_SYRINGE | INTRAVITREAL | Status: AC | PRN
  Administered 2022-04-26: 2.5 mg via INTRAVITREAL

## 2022-04-26 MED ORDER — FLUORESCEIN SODIUM 10 % IV SOLN
500.0000 mg | INTRAVENOUS | Status: AC | PRN
  Administered 2022-04-26: 500 mg via INTRAVENOUS

## 2022-04-26 NOTE — Progress Notes (Signed)
04/26/2022     CHIEF COMPLAINT Patient presents for  Chief Complaint  Patient presents with   Macular Degeneration      HISTORY OF PRESENT ILLNESS: Mark Wu is a 88 y.o. male who presents to the clinic today for:   HPI   8 weeks for DILATE OU, COLOR FP, OPTOS FFA L/R, AVASTIN OCT, OS. Pt stated no changes in vision since last visit.  Last edited by Silvestre Moment on 04/26/2022 11:01 AM.      Referring physician: [name], MD [place],  [place] [id number]  HISTORICAL INFORMATION:   Selected notes from the MEDICAL RECORD NUMBER       CURRENT MEDICATIONS: Current Outpatient Medications (Ophthalmic Drugs)  Medication Sig   latanoprost (XALATAN) 0.005 % ophthalmic solution INSTILL 1 DROP IN THE LEFT EYE AT BEDTIME (DISCARD 42 DAYS AFTER OPENING)   No current facility-administered medications for this visit. (Ophthalmic Drugs)   Current Outpatient Medications (Other)  Medication Sig   amLODipine (NORVASC) 5 MG tablet Take 1 tablet (5 mg total) by mouth daily.   Calcium Carb-Cholecalciferol (CALCIUM CARBONATE-VITAMIN D3 PO) Take by mouth.   clotrimazole-betamethasone (LOTRISONE) cream APPLY TOPICALLY AS NEEDED   warfarin (COUMADIN) 2.5 MG tablet TAKE 1 TABLET DAILY EXCEPT TAKE 2 TABLETS ON MONDAYS OR TAKE AS DIRECTED BY ANTICOAGULATION CLINIC   No current facility-administered medications for this visit. (Other)      REVIEW OF SYSTEMS: ROS   Negative for: Constitutional, Gastrointestinal, Neurological, Skin, Genitourinary, Musculoskeletal, HENT, Endocrine, Cardiovascular, Eyes, Respiratory, Psychiatric, Allergic/Imm, Heme/Lymph Last edited by Silvestre Moment on 04/26/2022 11:01 AM.       ALLERGIES No Known Allergies  PAST MEDICAL HISTORY Past Medical History:  Diagnosis Date   Anal fissure 06/11/2009   Atrial fibrillation ([place]) 06/11/2009   HYPERTENSION 06/11/2009   NECK MASS 10/01/2010   Prostate cancer ([place])    Past Surgical History:   Procedure Laterality Date   CARPAL TUNNEL RELEASE     Both hands   CYST REMOVAL HAND     Left hand   CYSTOSCOPY  2000   hand   PROSTATE BIOPSY      FAMILY HISTORY Family History  Problem Relation Age of Onset   Cancer Sister 25       unknown type   Cancer Brother        prostate/seed implant with [name]/died 2014/01/06 of heart attack    SOCIAL HISTORY Social History   Tobacco Use   Smoking status: Former    Types: Cigars    Quit date: 08/08/1991    Years since quitting: 30.7   Smokeless tobacco: Never  Vaping Use   Vaping Use: Never used  Substance Use Topics   Alcohol use: No   Drug use: No         OPHTHALMIC EXAM:  Base Eye Exam     Visual Acuity (ETDRS)       Right Left   Dist cc CF at 3' 20/40   Dist ph cc  NI    Correction: Glasses         Tonometry (Tonopen, 11:05 AM)       Right Left   Pressure 16 16         Pupils       Pupils APD   Right PERRL None   Left PERRL None         Visual Fields       Left Right    Full  Restrictions  Partial inner superior temporal, inferior temporal, superior nasal, inferior nasal deficiencies         Extraocular Movement       Right Left    Full Full         Neuro/Psych     Oriented x3: Yes   Mood/Affect: Normal         Dilation     Both eyes: 1.0% Mydriacyl, 2.5% Phenylephrine @ 11:05 AM           Slit Lamp and Fundus Exam     External Exam       Right Left   External Normal Normal         Slit Lamp Exam       Right Left   Lids/Lashes Normal Normal   Conjunctiva/Sclera White and quiet White and quiet   Cornea Clear Clear   Anterior Chamber Deep and quiet Deep and quiet   Iris Round and reactive Round and reactive   Lens Centered posterior chamber intraocular lens Centered posterior chamber intraocular lens   Anterior Vitreous Normal Normal         Fundus Exam       Right Left   Posterior Vitreous Posterior vitreous detachment Posterior vitreous  detachment, Central vitreous floaters   Disc Normal Peripapillary atrophy   C/D Ratio 0.2 0.15   Macula Hard drusen, Geographic atrophy into the FAZ yet someparaMacular RPE remains, Disciform scar Less yet persistent subretinal hemorrhage small and inferior  to the fovea, Disciform scar with large mature choroidal vessels as the new blood supply to the macula, stable for years, Retinal pigment epithelial atrophy, Geographic atrophy perifoveal   Vessels Normal Normal   Periphery Normal Normal            IMAGING AND PROCEDURES  Imaging and Procedures for 04/26/22  OCT, Retina - OU - Both Eyes       Right Eye Quality was good. Scan locations included subfoveal. Central Foveal Thickness: 301. Progression has been stable. Findings include abnormal foveal contour, subretinal hyper-reflective material, subretinal scarring.   Left Eye Quality was borderline. Scan locations included subfoveal. Central Foveal Thickness: 358. Progression has been stable. Findings include no IRF, no SRF, abnormal foveal contour, choroidal neovascular membrane, disciform scar, subretinal scarring.   Notes OS, stabilize lesion nasal to the fovea, chronically active and yet only stable at 8  week  follow-up interval.  Repeat injection OS today to maintain and follow-up again in 8 -week  OD with massive permanent disciform fibrotic scar, stable overall      Intravitreal Injection, Pharmacologic Agent - OS - Left Eye       Time Out 04/26/2022. 11:48 AM. Confirmed correct patient, procedure, site, and patient consented.   Anesthesia Topical anesthesia was used. Anesthetic medications included Lidocaine 4%.   Procedure Preparation included 5% betadine to ocular surface, 10% betadine to eyelids, Tobramycin 0.3%, Ofloxacin . A 30 gauge needle was used.   Injection: 2.5 mg bevacizumab 2.5 MG/0.1ML   Route: Intravitreal, Site: Left Eye   NDC: [phone], Lot: [id number], Expiration date: 06/22/2022    Post-op Post injection exam found visual acuity of at least counting fingers. The patient tolerated the procedure well. There were no complications. The patient received written and verbal post procedure care education. Post injection medications included ocuflox.      Color Fundus Photography Optos - OU - Both Eyes       Right Eye Progression has been stable. Disc findings include normal  observations. Macula : geographic atrophy.   Left Eye Progression has been stable. Disc findings include normal observations. Macula : geographic atrophy, retinal pigment epithelium abnormalities.   Notes Subfoveal disciform scar, geographic atrophy OD, accounts for acuity, no active CNVM OD,  OS geographic macular atrophy, mature large trunk of a CNVM into the foveal region with subfoveal fibrosis yet preserved good acuity, stable overall, with dot like hemorrhages along the inferior aspect of the geographic atrophy parafoveal        Fluorescein Angiography Optos (Transit OS)       Injection: 500 mg Fluorescein Sodium 10 %   Route: Intravenous, Site: Left Arm   NDC: [phone]   Right Eye   Progression has no prior data. Mid/Late phase findings include window defect. Choroidal neovascularization is subfoveal.   Left Eye   Progression has no prior data. Early phase findings include window defect, choroidal neovascularization. Mid/Late phase findings include window defect, choroidal neovascularization. Choroidal neovascularization is occult.   Notes OD old disciform scar with geographic atrophy remnant.  No change  OS subfoveal large disciform scar is the perfusion to the macula and requires ongoing therapy to maintain and keep stable              ASSESSMENT/PLAN:  Exudative age-related macular degeneration of left eye with active choroidal neovascularization (HCC) Chronic active CNVM with now disciform mature CNVM subfoveal with edges of activity with intraretinal  hemorrhages adjacent to the parafoveal region.  Stabilized maintain good acuity currently at 8-week intervals.  Exudative age-related macular degeneration of right eye with inactive choroidal neovascularization (HCC) No sign of active CNVM OD     ICD-10-CM   1. Exudative age-related macular degeneration of left eye with active choroidal neovascularization (HCC)  H35.3221 OCT, Retina - OU - Both Eyes    Intravitreal Injection, Pharmacologic Agent - OS - Left Eye    Color Fundus Photography Optos - OU - Both Eyes    Fluorescein Angiography Optos (Transit OS)    bevacizumab (AVASTIN) SOSY 2.5 mg    Fluorescein Sodium 10 % injection 500 mg    2. Exudative age-related macular degeneration of right eye with inactive choroidal neovascularization ([place])  H35.3212       1.  OS chronically active subfoveal disciform scar now for many years with multiple recurrences.  Monocular patient.  Dot-like hemorrhages are seen clinically as well as confirmed on color fundus photography.  We will repeat injection Avastin today to maintain quality vision  2.  OD old subfoveal disciform scar limits acuity, no active disease.  Observe  3.  Ophthalmic Meds Ordered this visit:  Meds ordered this encounter  Medications   bevacizumab (AVASTIN) SOSY 2.5 mg   Fluorescein Sodium 10 % injection 500 mg       Return in about 8 weeks (around 06/21/2022) for dilate, OS, AVASTIN OCT.  There are no Patient Instructions on file for this visit.   Explained the diagnoses, plan, and follow up with the patient and they expressed understanding.  Patient expressed understanding of the importance of proper follow up care.   Clent Demark Marriana Hibberd M.D. Diseases & Surgery of the Retina and Vitreous [facility] [place] 04/26/22     Abbreviations: M myopia (nearsighted); A astigmatism; H hyperopia (farsighted); P presbyopia; Mrx spectacle prescription;  CTL contact lenses; OD right eye; OS left eye; OU both eyes  XT  exotropia; ET esotropia; PEK punctate epithelial keratitis; PEE punctate epithelial erosions; DES dry eye syndrome; MGD meibomian gland  dysfunction; ATs artificial tears; PFAT's preservative free artificial tears; [place] nuclear sclerotic cataract; PSC posterior subcapsular cataract; ERM epi-retinal membrane; PVD posterior vitreous detachment; RD retinal detachment; DM diabetes mellitus; DR diabetic retinopathy; NPDR non-proliferative diabetic retinopathy; PDR proliferative diabetic retinopathy; CSME clinically significant macular edema; DME diabetic macular edema; dbh dot blot hemorrhages; CWS cotton wool spot; POAG primary open angle glaucoma; C/D cup-to-disc ratio; HVF humphrey visual field; GVF goldmann visual field; OCT optical coherence tomography; IOP intraocular pressure; BRVO Branch retinal vein occlusion; CRVO central retinal vein occlusion; CRAO central retinal artery occlusion; BRAO branch retinal artery occlusion; RT retinal tear; SB scleral buckle; PPV pars plana vitrectomy; VH Vitreous hemorrhage; PRP panretinal laser photocoagulation; IVK intravitreal kenalog; VMT vitreomacular traction; MH Macular hole;  NVD neovascularization of the disc; NVE neovascularization elsewhere; AREDS age related eye disease study; ARMD age related macular degeneration; POAG primary open angle glaucoma; EBMD epithelial/anterior basement membrane dystrophy; ACIOL anterior chamber intraocular lens; IOL intraocular lens; PCIOL posterior chamber intraocular lens; Phaco/IOL phacoemulsification with intraocular lens placement; [place] photorefractive keratectomy; LASIK laser assisted in situ keratomileusis; HTN hypertension; DM diabetes mellitus; COPD chronic obstructive pulmonary disease

## 2022-04-26 NOTE — Assessment & Plan Note (Signed)
No sign of active CNVM OD

## 2022-04-26 NOTE — Assessment & Plan Note (Signed)
Chronic active CNVM with now disciform mature CNVM subfoveal with edges of activity with intraretinal hemorrhages adjacent to the parafoveal region.  Stabilized maintain good acuity currently at 8-week intervals.

## 2022-04-28 NOTE — Telephone Encounter (Signed)
Pt informed informed of the message below.

## 2022-05-04 ENCOUNTER — Ambulatory Visit (HOSPITAL_COMMUNITY): Admission: RE | Admit: 2022-05-04 | Payer: Medicare Other | Source: Ambulatory Visit

## 2022-05-04 ENCOUNTER — Encounter (HOSPITAL_COMMUNITY): Payer: Medicare Other

## 2022-05-11 ENCOUNTER — Other Ambulatory Visit: Payer: Self-pay | Admitting: Family Medicine

## 2022-05-11 DIAGNOSIS — Z7901 Long term (current) use of anticoagulants: Secondary | ICD-10-CM

## 2022-05-11 NOTE — Telephone Encounter (Signed)
Pt is compliant with warfarin management and PCP apts. ?Sent in refill.  ?

## 2022-05-16 ENCOUNTER — Ambulatory Visit (INDEPENDENT_AMBULATORY_CARE_PROVIDER_SITE_OTHER): Payer: Medicare Other

## 2022-05-16 DIAGNOSIS — Z7901 Long term (current) use of anticoagulants: Secondary | ICD-10-CM | POA: Diagnosis not present

## 2022-05-16 LAB — POCT INR: INR: 1.6 — AB (ref 2.0–3.0)

## 2022-05-16 NOTE — Patient Instructions (Addendum)
Pre visit review using our clinic review tool, if applicable. No additional management support is needed unless otherwise documented below in the visit note.  Increase dose to take 2 tablets and increase dose tomorrow to take 1 1/2 tablets and then change week to take 1 1/2 tablets daily except take 1 tablet on Thursdays. Recheck in 3 weeks.

## 2022-05-16 NOTE — Progress Notes (Addendum)
Increase dose to take 2 tablets and increase dose tomorrow to take 1 1/2 tablets and then change week to take 1 1/2 tablets daily except take 1 tablet on Thursdays. Recheck in 3 weeks per pt request due to transportation difficulties.  Pt not aware of any changes that would cause subtherapeutic INR.

## 2022-06-06 ENCOUNTER — Ambulatory Visit (INDEPENDENT_AMBULATORY_CARE_PROVIDER_SITE_OTHER): Payer: Medicare Other

## 2022-06-06 DIAGNOSIS — Z7901 Long term (current) use of anticoagulants: Secondary | ICD-10-CM

## 2022-06-06 LAB — POCT INR: INR: 4.7 — AB (ref 2.0–3.0)

## 2022-06-06 NOTE — Patient Instructions (Addendum)
Pre visit review using our clinic review tool, if applicable. No additional management support is needed unless otherwise documented below in the visit note.  Hold dose today and hold dose tomorrow and then change weekly dose to take 1 tablet daily except take 1 1/2 tablets on Mondays, Wednesdays and Fridays.  Recheck in 3 weeks.

## 2022-06-06 NOTE — Progress Notes (Addendum)
Pt uncertain why he has an elevated INR. Pt denies any changes.   Hold dose today and hold dose tomorrow and then change weekly dose to take 1 tablet daily except take 1 1/2 tablets on Mondays, Wednesdays and Fridays.  Recheck in 3 weeks.

## 2022-06-15 ENCOUNTER — Encounter: Payer: Self-pay | Admitting: Family Medicine

## 2022-06-15 ENCOUNTER — Ambulatory Visit (INDEPENDENT_AMBULATORY_CARE_PROVIDER_SITE_OTHER): Payer: Medicare Other | Admitting: Family Medicine

## 2022-06-15 ENCOUNTER — Ambulatory Visit: Payer: Medicare Other | Admitting: Family Medicine

## 2022-06-15 VITALS — BP 130/60 | HR 47 | Temp 98.1°F | Ht 69.0 in | Wt 158.3 lb

## 2022-06-15 DIAGNOSIS — I4891 Unspecified atrial fibrillation: Secondary | ICD-10-CM | POA: Diagnosis not present

## 2022-06-15 DIAGNOSIS — I10 Essential (primary) hypertension: Secondary | ICD-10-CM | POA: Diagnosis not present

## 2022-06-15 DIAGNOSIS — R748 Abnormal levels of other serum enzymes: Secondary | ICD-10-CM | POA: Diagnosis not present

## 2022-06-15 NOTE — Progress Notes (Signed)
Established Patient Office Visit  Subjective   Patient ID: Mark Wu, male    DOB: 1925/12/28  Age: 88 y.o. MRN: [id number]  Chief Complaint  Patient presents with   Follow-up    HPI   Seen for medical follow-up.  Accompanied by his wife and daughter-in-law.  Patient is followed by the Agency and has new hearing aids which are working better than his old ones.  He still drives some but very limited driving.  He has history of hypertension, atrial fibrillation, macular degeneration.  No longer drives at night.  He also has prostate cancer and still followed by urology.  Is on chronic Coumadin.  Recent INR 4.7.  He had some weight loss last year and this has stabilized.  He states he has excellent appetite at this time.  Weight is up 1 pound from last visit.  Denies any recent falls.  Ambulates with a cane.  He had mildly elevated alkaline phosphatase.  Normal 5 prime nucleotidase.  Never went for bone scan.  Denies any bony pain.  Past Medical History:  Diagnosis Date   Anal fissure 06/11/2009   Atrial fibrillation ([place]) 06/11/2009   HYPERTENSION 06/11/2009   NECK MASS 10/01/2010   Prostate cancer [facility])    Past Surgical History:  Procedure Laterality Date   CARPAL TUNNEL RELEASE     Both hands   CYST REMOVAL HAND     Left hand   CYSTOSCOPY  2000   hand   PROSTATE BIOPSY      reports that he quit smoking about 30 years ago. His smoking use included cigars. He has never used smokeless tobacco. He reports that he does not drink alcohol and does not use drugs. family history includes Cancer in his brother; Cancer (age of onset: 72) in his sister. No Known Allergies  Review of Systems  Constitutional:  Negative for malaise/fatigue.  Respiratory:  Negative for shortness of breath.   Cardiovascular:  Negative for chest pain.  Neurological:  Negative for dizziness, weakness and headaches.      Objective:     BP 130/60 (BP Location: Left Arm, Patient Position: Sitting, Cuff  Size: Normal)   Pulse (!) 47   Temp 98.1 F (36.7 C) (Oral)   Ht '5\' 9"'$  (1.753 m)   Wt 158 lb 4.8 oz (71.8 kg)   SpO2 98%   BMI 23.38 kg/m    Physical Exam Vitals reviewed.  Constitutional:      Appearance: He is well-developed.  HENT:     Right Ear: External ear normal.     Left Ear: External ear normal.  Eyes:     Pupils: Pupils are equal, round, and reactive to light.  Neck:     Thyroid: No thyromegaly.  Cardiovascular:     Rate and Rhythm: Normal rate and regular rhythm.  Pulmonary:     Effort: Pulmonary effort is normal. No respiratory distress.     Breath sounds: Normal breath sounds. No wheezing or rales.  Musculoskeletal:     Cervical back: Neck supple.     Right lower leg: No edema.     Left lower leg: No edema.  Neurological:     Mental Status: He is alert and oriented to person, place, and time.      No results found for any visits on 06/15/22.    The ASCVD Risk score (Arnett DK, et al., 2019) failed to calculate for the following reasons:   The 2019 ASCVD risk score is only valid  for ages 84 to 1    Assessment & Plan:   #1 hypertension stable and at goal.  Continue amlodipine 5 mg daily  #2 history of atrial fibrillation.  Appears to be in normal sinus rhythm at this time.  Continue Coumadin.  Continue close follow-up with Coumadin clinic  #3 history of elevated alkaline phosphatase.  Normal 5 prime nucleotidase.  Patient never went for bone scan.  We did discuss whether to further evaluate no decided at this point no further evaluation which seems reasonable particularly in view of the fact that he is gaining or at least stable with weight and having no bone pain or other new symptoms   No follow-ups on file.    [name], MD

## 2022-06-17 ENCOUNTER — Telehealth: Payer: Self-pay | Admitting: Family Medicine

## 2022-06-17 NOTE — Telephone Encounter (Signed)
Pt missed a call from our office

## 2022-06-17 NOTE — Telephone Encounter (Signed)
I have not attempted to contact patient

## 2022-06-21 ENCOUNTER — Encounter (INDEPENDENT_AMBULATORY_CARE_PROVIDER_SITE_OTHER): Payer: Self-pay | Admitting: Ophthalmology

## 2022-06-21 ENCOUNTER — Ambulatory Visit (INDEPENDENT_AMBULATORY_CARE_PROVIDER_SITE_OTHER): Payer: Medicare Other | Admitting: Ophthalmology

## 2022-06-21 DIAGNOSIS — H353212 Exudative age-related macular degeneration, right eye, with inactive choroidal neovascularization: Secondary | ICD-10-CM | POA: Diagnosis not present

## 2022-06-21 DIAGNOSIS — H353221 Exudative age-related macular degeneration, left eye, with active choroidal neovascularization: Secondary | ICD-10-CM

## 2022-06-21 DIAGNOSIS — H353114 Nonexudative age-related macular degeneration, right eye, advanced atrophic with subfoveal involvement: Secondary | ICD-10-CM | POA: Diagnosis not present

## 2022-06-21 MED ORDER — BEVACIZUMAB CHEMO INJECTION 1.25MG/0.05ML SYRINGE FOR KALEIDOSCOPE
1.2500 mg | INTRAVITREAL | Status: AC | PRN
  Administered 2022-06-21: 1.25 mg via INTRAVITREAL

## 2022-06-21 NOTE — Progress Notes (Signed)
06/21/2022     CHIEF COMPLAINT Patient presents for  Chief Complaint  Patient presents with   Macular Degeneration      HISTORY OF PRESENT ILLNESS: Mark Wu is a 88 y.o. male who presents to the clinic today for:     Referring physician: [name], MD [place],  [place] [address]  HISTORICAL INFORMATION:   Selected notes from the [facility]: Current Outpatient Medications (Ophthalmic Drugs)  Medication Sig   latanoprost (XALATAN) 0.005 % ophthalmic solution INSTILL 1 DROP IN THE LEFT EYE AT BEDTIME (DISCARD 42 DAYS AFTER OPENING)   No current facility-administered medications for this visit. (Ophthalmic Drugs)   Current Outpatient Medications (Other)  Medication Sig   amLODipine (NORVASC) 5 MG tablet Take 1 tablet (5 mg total) by mouth daily.   Calcium Carb-Cholecalciferol (CALCIUM CARBONATE-VITAMIN D3 PO) Take by mouth.   clotrimazole-betamethasone (LOTRISONE) cream APPLY TOPICALLY AS NEEDED   warfarin (COUMADIN) 2.5 MG tablet TAKE 1 1/2 TABLETS BY MOUTH DAILY EXCEPT TAKE 1 TABLET ON SUNDAYS, TUESDAYS AND THURSDAYS OR TAKE AS DIRECTED BY ANTICOAGULATION CLINIC   No current facility-administered medications for this visit. (Other)      REVIEW OF SYSTEMS: ROS   Negative for: Constitutional, Gastrointestinal, Neurological, Skin, Genitourinary, Musculoskeletal, HENT, Endocrine, Cardiovascular, Eyes, Respiratory, Psychiatric, Allergic/Imm, Heme/Lymph Last edited by Hurman Horn, MD on 06/21/2022 10:05 AM.       ALLERGIES No Known Allergies  PAST MEDICAL HISTORY Past Medical History:  Diagnosis Date   Anal fissure 06/11/2009   Atrial fibrillation ([place]) 06/11/2009   HYPERTENSION 06/11/2009   NECK MASS 10/01/2010   Prostate cancer ([place])    Past Surgical History:  Procedure Laterality Date   CARPAL TUNNEL RELEASE     Both hands   CYST REMOVAL HAND     Left hand   CYSTOSCOPY  2000   hand    PROSTATE BIOPSY      FAMILY HISTORY Family History  Problem Relation Age of Onset   Cancer Sister 47       unknown type   Cancer Brother        prostate/seed implant with [name]/died 2014/01/19 of heart attack    SOCIAL HISTORY Social History   Tobacco Use   Smoking status: Former    Types: Cigars    Quit date: 08/08/1991    Years since quitting: 30.8   Smokeless tobacco: Never  Vaping Use   Vaping Use: Never used  Substance Use Topics   Alcohol use: No   Drug use: No         OPHTHALMIC EXAM:  Base Eye Exam     Visual Acuity (ETDRS)       Right Left   Dist cc CF at 3' 20/40 +2         Pupils       Pupils React APD   Right PERRL Brisk None   Left PERRL Brisk None         Visual Fields       Left Right    Full Full         Neuro/Psych     Oriented x3: Yes   Mood/Affect: Normal           Slit Lamp and Fundus Exam     External Exam       Right Left   External Normal Normal  Slit Lamp Exam       Right Left   Lids/Lashes Normal Normal   Conjunctiva/Sclera White and quiet White and quiet   Cornea Clear Clear   Anterior Chamber Deep and quiet Deep and quiet   Iris Round and reactive Round and reactive   Lens Centered posterior chamber intraocular lens Centered posterior chamber intraocular lens   Anterior Vitreous Normal Normal         Fundus Exam       Right Left   Posterior Vitreous  Posterior vitreous detachment, Central vitreous floaters   Disc  Peripapillary atrophy   C/D Ratio  0.15   Macula  Less yet persistent subretinal hemorrhage small and inferior  to the fovea, Disciform scar with large mature choroidal vessels as the new blood supply to the macula, stable for years, Retinal pigment epithelial atrophy, Geographic atrophy perifoveal   Vessels  Normal   Periphery  Normal            IMAGING AND PROCEDURES  Imaging and Procedures for 06/21/22  OCT, Retina - OU - Both Eyes       Right Eye Quality  was good. Scan locations included subfoveal. Central Foveal Thickness: 293. Progression has been stable. Findings include abnormal foveal contour, subretinal hyper-reflective material, subretinal scarring.   Left Eye Quality was borderline. Scan locations included subfoveal. Central Foveal Thickness: 354. Progression has been stable. Findings include no IRF, no SRF, abnormal foveal contour, choroidal neovascular membrane, disciform scar, subretinal scarring.   Notes OS, stabilize lesion nasal to the fovea, chronically active and yet only stable at 8  week  follow-up interval.  Repeat injection OS today to maintain and follow-up again in 8 -week  OD with massive permanent disciform fibrotic scar, stable overall      Intravitreal Injection, Pharmacologic Agent - OS - Left Eye       Time Out 06/21/2022. 10:34 AM. Confirmed correct patient, procedure, site, and patient consented.   Anesthesia Topical anesthesia was used. Anesthetic medications included Lidocaine 4%.   Procedure Preparation included 5% betadine to ocular surface, 10% betadine to eyelids, Tobramycin 0.3%, Ofloxacin . A 30 gauge needle was used.   Injection: 1.25 mg Bevacizumab 1.'25mg'$ /0.64m   Route: Intravitreal, Site: Left Eye   NDC: [id number] Lot: [id number] Expiration date: 08/31/2022   Post-op Post injection exam found visual acuity of at least counting fingers. The patient tolerated the procedure well. There were no complications. The patient received written and verbal post procedure care education. Post injection medications included ocuflox.              ASSESSMENT/PLAN:  Exudative age-related macular degeneration of left eye with active choroidal neovascularization (HCC) Chronic active disease with subfoveal disciform scar with chronic active intraretinal fluid multiple recurrences.  Currently at 8-week follow-up interval post Avastin  Nonexudative age-related macular degeneration, right eye, advanced  atrophic with subfoveal involvement No change over time no sign of CNVM  Exudative age-related macular degeneration of right eye with inactive choroidal neovascularization (HCC) No recurrences     ICD-10-CM   1. Exudative age-related macular degeneration of left eye with active choroidal neovascularization (HCC)  H35.3221 OCT, Retina - OU - Both Eyes    Intravitreal Injection, Pharmacologic Agent - OS - Left Eye    Bevacizumab (AVASTIN) SOLN 1.25 mg    2. Nonexudative age-related macular degeneration, right eye, advanced atrophic with subfoveal involvement  H35.3114     3. Exudative age-related macular degeneration of right eye with inactive choroidal  neovascularization ([place])  H35.3212       1.  OS subfoveal CNVM chronic active disciform, controlled on 8-week interval with good acuity maintained for over 12 years.  2.  OD no sign of recurrent CNVM dense central, maintain  3.  Ophthalmic Meds Ordered this visit:  Meds ordered this encounter  Medications   Bevacizumab (AVASTIN) SOLN 1.25 mg       Return in about 8 weeks (around 08/16/2022) for DILATE OU, AVASTIN OCT, OS.  There are no Patient Instructions on file for this visit.   Explained the diagnoses, plan, and follow up with the patient and they expressed understanding.  Patient expressed understanding of the importance of proper follow up care.   Clent Demark Kewanna Kasprzak M.D. Diseases & Surgery of the Retina and Vitreous [facility] [place] 06/21/22     Abbreviations: M myopia (nearsighted); A astigmatism; H hyperopia (farsighted); P presbyopia; Mrx spectacle prescription;  CTL contact lenses; OD right eye; OS left eye; OU both eyes  XT exotropia; ET esotropia; PEK punctate epithelial keratitis; PEE punctate epithelial erosions; DES dry eye syndrome; MGD meibomian gland dysfunction; ATs artificial tears; PFAT's preservative free artificial tears; [place] nuclear sclerotic cataract; PSC posterior subcapsular cataract; ERM  epi-retinal membrane; PVD posterior vitreous detachment; RD retinal detachment; DM diabetes mellitus; DR diabetic retinopathy; NPDR non-proliferative diabetic retinopathy; PDR proliferative diabetic retinopathy; CSME clinically significant macular edema; DME diabetic macular edema; dbh dot blot hemorrhages; CWS cotton wool spot; POAG primary open angle glaucoma; C/D cup-to-disc ratio; HVF humphrey visual field; GVF goldmann visual field; OCT optical coherence tomography; IOP intraocular pressure; BRVO Branch retinal vein occlusion; CRVO central retinal vein occlusion; CRAO central retinal artery occlusion; BRAO branch retinal artery occlusion; RT retinal tear; SB scleral buckle; PPV pars plana vitrectomy; VH Vitreous hemorrhage; PRP panretinal laser photocoagulation; IVK intravitreal kenalog; VMT vitreomacular traction; MH Macular hole;  NVD neovascularization of the disc; NVE neovascularization elsewhere; AREDS age related eye disease study; ARMD age related macular degeneration; POAG primary open angle glaucoma; EBMD epithelial/anterior basement membrane dystrophy; ACIOL anterior chamber intraocular lens; IOL intraocular lens; PCIOL posterior chamber intraocular lens; Phaco/IOL phacoemulsification with intraocular lens placement; [place] photorefractive keratectomy; LASIK laser assisted in situ keratomileusis; HTN hypertension; DM diabetes mellitus; COPD chronic obstructive pulmonary disease

## 2022-06-21 NOTE — Assessment & Plan Note (Signed)
Chronic active disease with subfoveal disciform scar with chronic active intraretinal fluid multiple recurrences.  Currently at 8-week follow-up interval post Avastin

## 2022-06-21 NOTE — Assessment & Plan Note (Signed)
No recurrences.

## 2022-06-21 NOTE — Assessment & Plan Note (Signed)
No change over time no sign of CNVM

## 2022-06-27 ENCOUNTER — Ambulatory Visit (INDEPENDENT_AMBULATORY_CARE_PROVIDER_SITE_OTHER): Payer: Medicare Other

## 2022-06-27 DIAGNOSIS — Z7901 Long term (current) use of anticoagulants: Secondary | ICD-10-CM | POA: Diagnosis not present

## 2022-06-27 LAB — POCT INR: INR: 2.7 (ref 2.0–3.0)

## 2022-06-27 NOTE — Patient Instructions (Addendum)
Pre visit review using our clinic review tool, if applicable. No additional management support is needed unless otherwise documented below in the visit note.  Continue 1 tablet daily except take 1 1/2 tablets on Mondays, Wednesdays and Fridays.  Recheck in 6 weeks.

## 2022-06-27 NOTE — Progress Notes (Signed)
Continue 1 tablet daily except take 1 1/2 tablets on Mondays, Wednesdays and Fridays.  Recheck in 6 weeks.

## 2022-06-28 IMAGING — DX DG ELBOW COMPLETE 3+V*L*
4 series · 4 of 4 positions shown · non-contrast
Comparison: None.

CLINICAL DATA: Left elbow pain after fall today.

EXAM:
LEFT ELBOW - COMPLETE 3+ VIEW

[elbow ap]
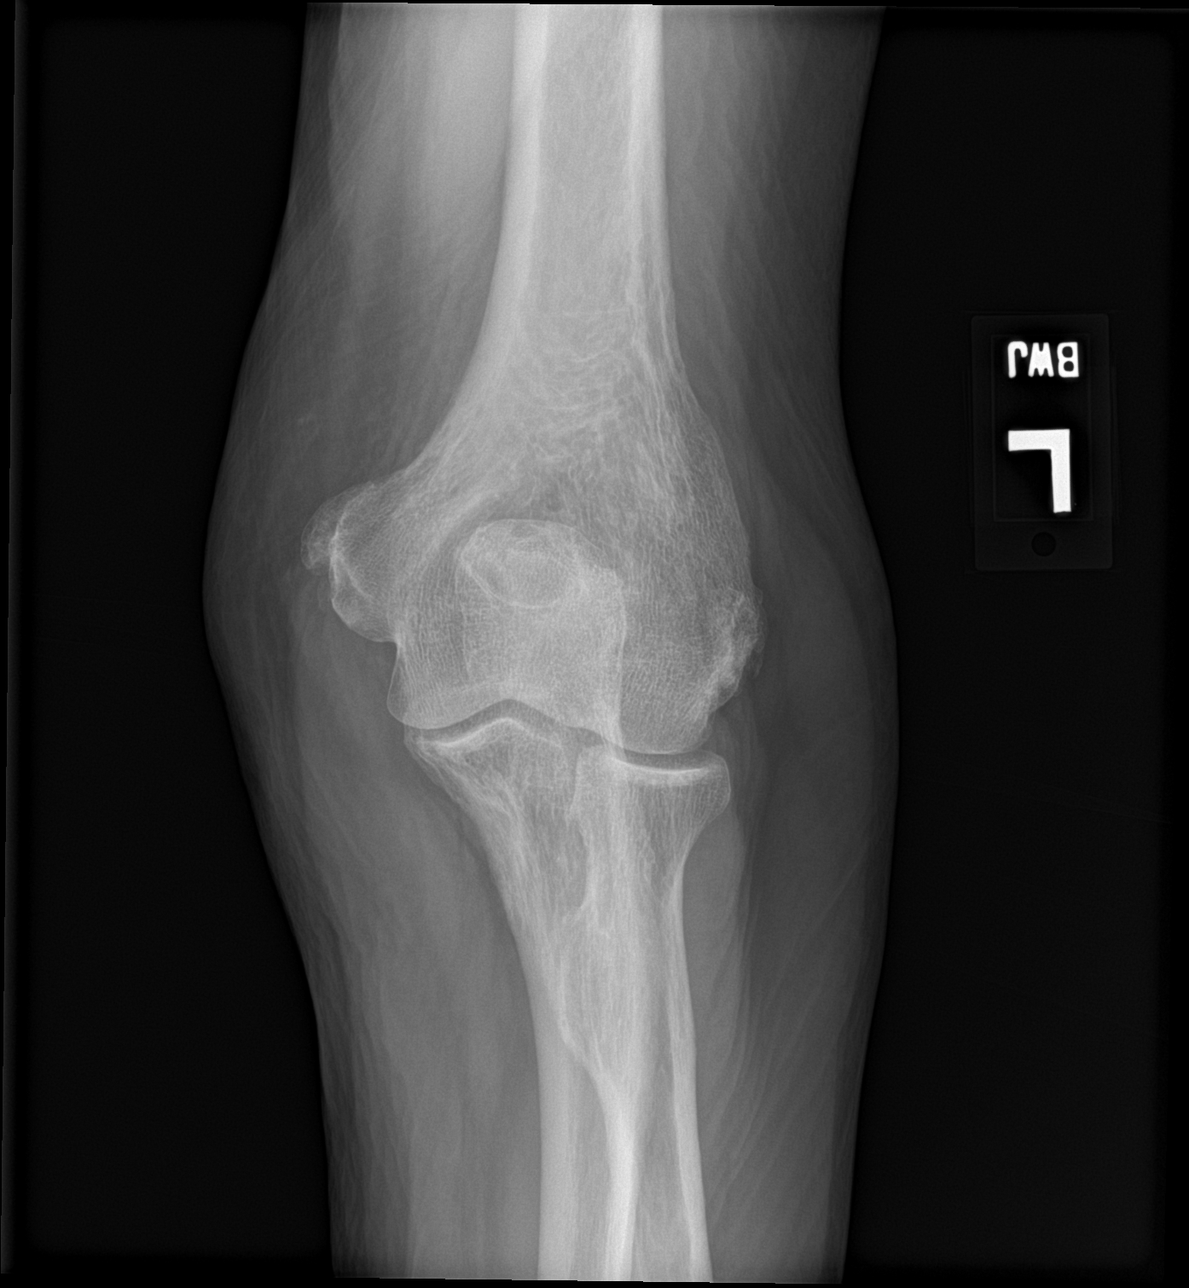

[elbow obl (1 of 2)]
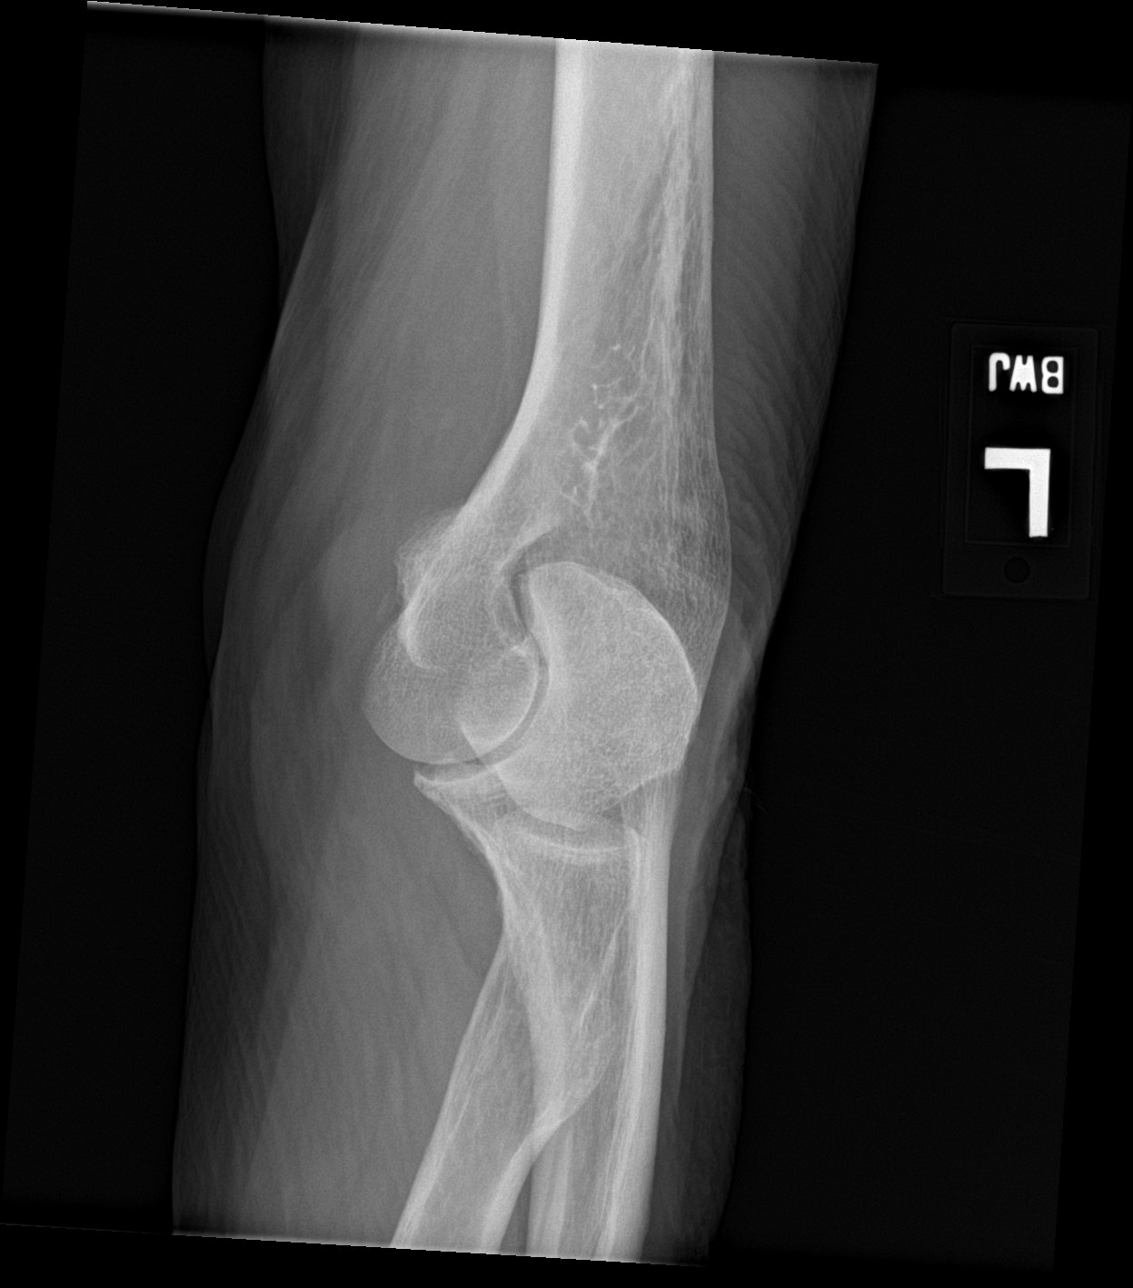

[elbow lat]
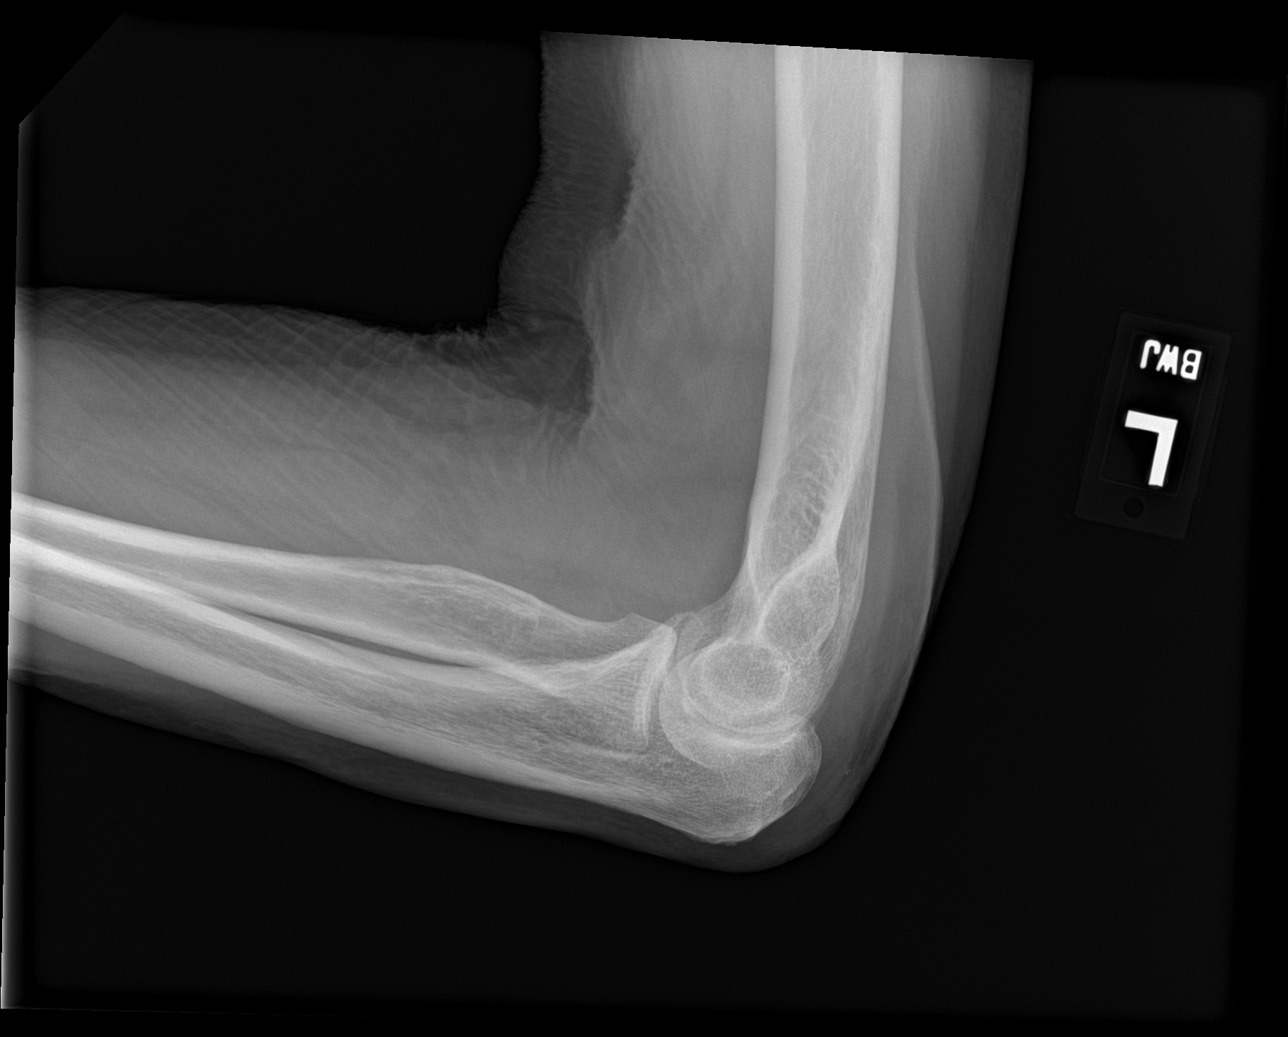

[elbow obl (2 of 2)]
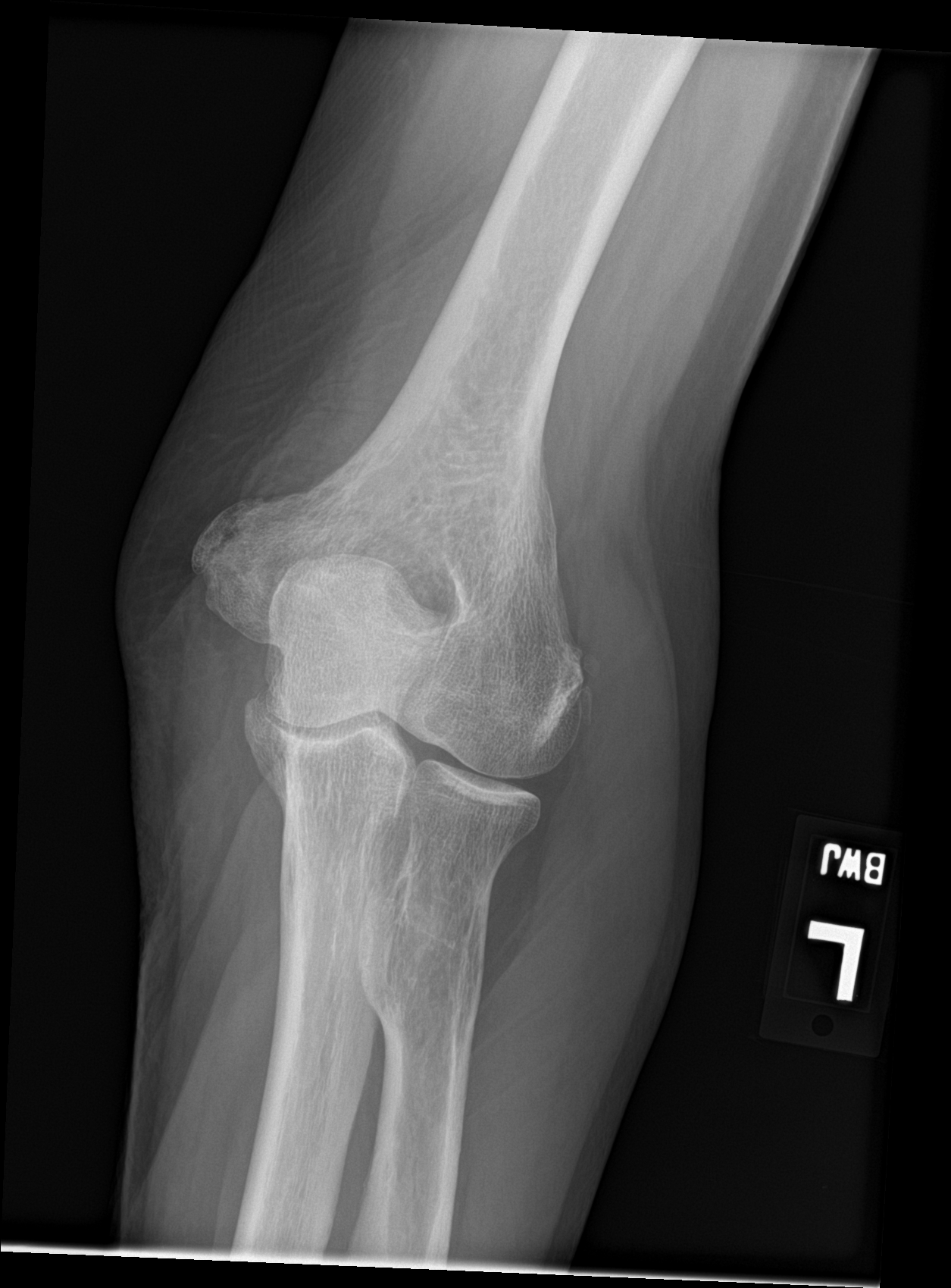

[4 of 4 positions shown; findings below may reference images not displayed]

FINDINGS: There is no evidence of fracture, dislocation, or joint effusion.
There is no evidence of arthropathy or other focal bone abnormality.
Soft tissues are unremarkable.
IMPRESSION: Negative.

## 2022-06-28 IMAGING — DX DG HIP (WITH OR WITHOUT PELVIS) 2-3V*L*
3 series · 3 of 3 positions shown · non-contrast
Comparison: None.

CLINICAL DATA: Acute LEFT hip pain following fall today. Initial
encounter.

EXAM:
DG HIP (WITH OR WITHOUT PELVIS) 2-3V LEFT

[pelvis ap]
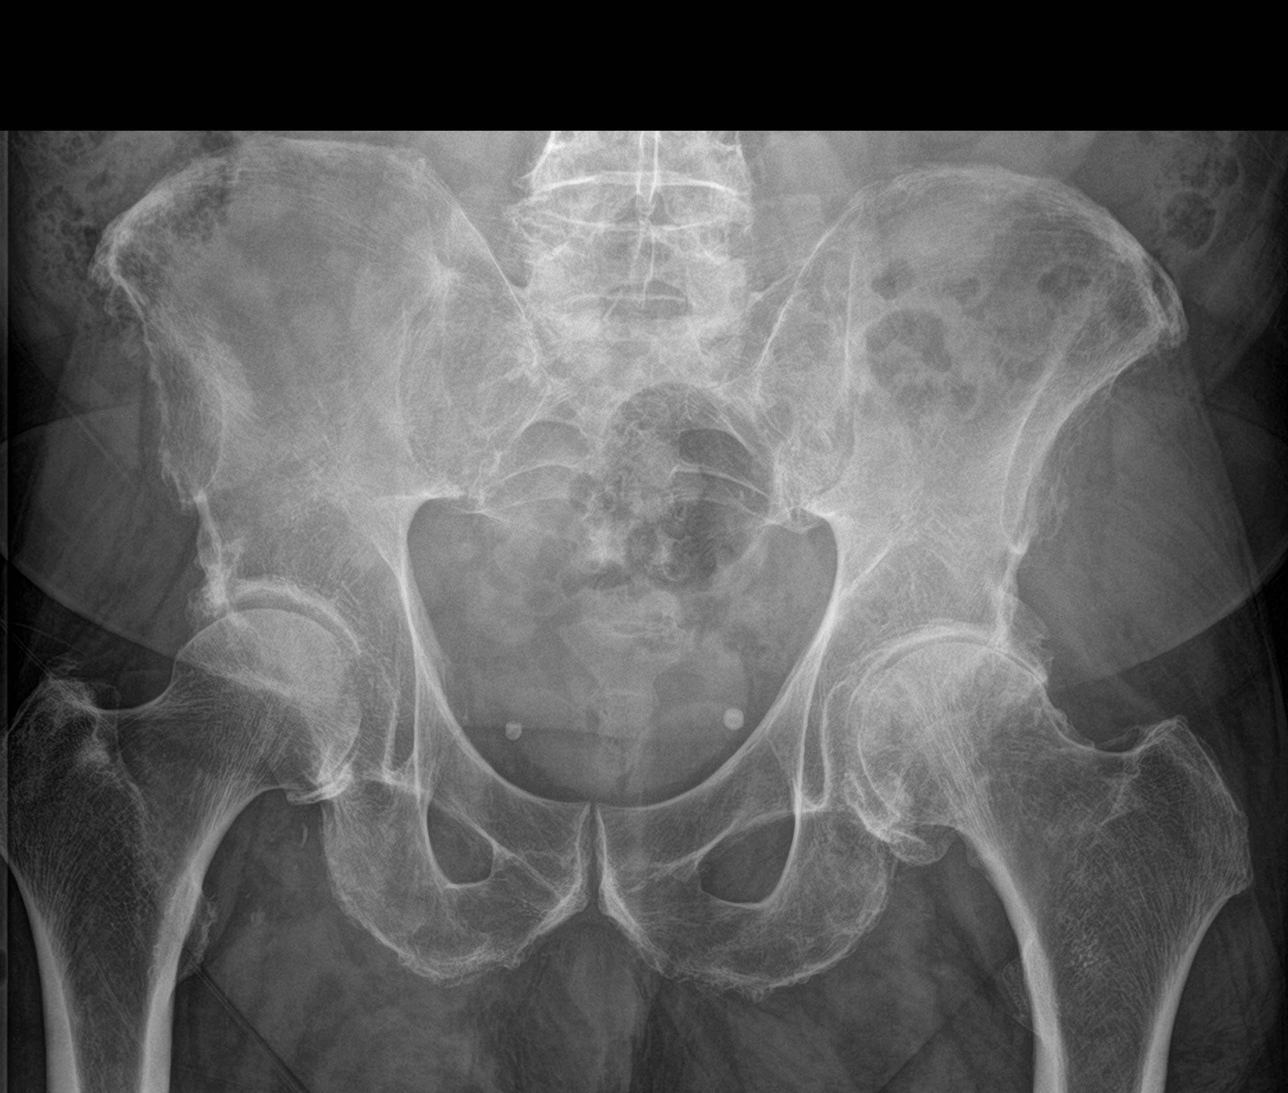

[hip ap]
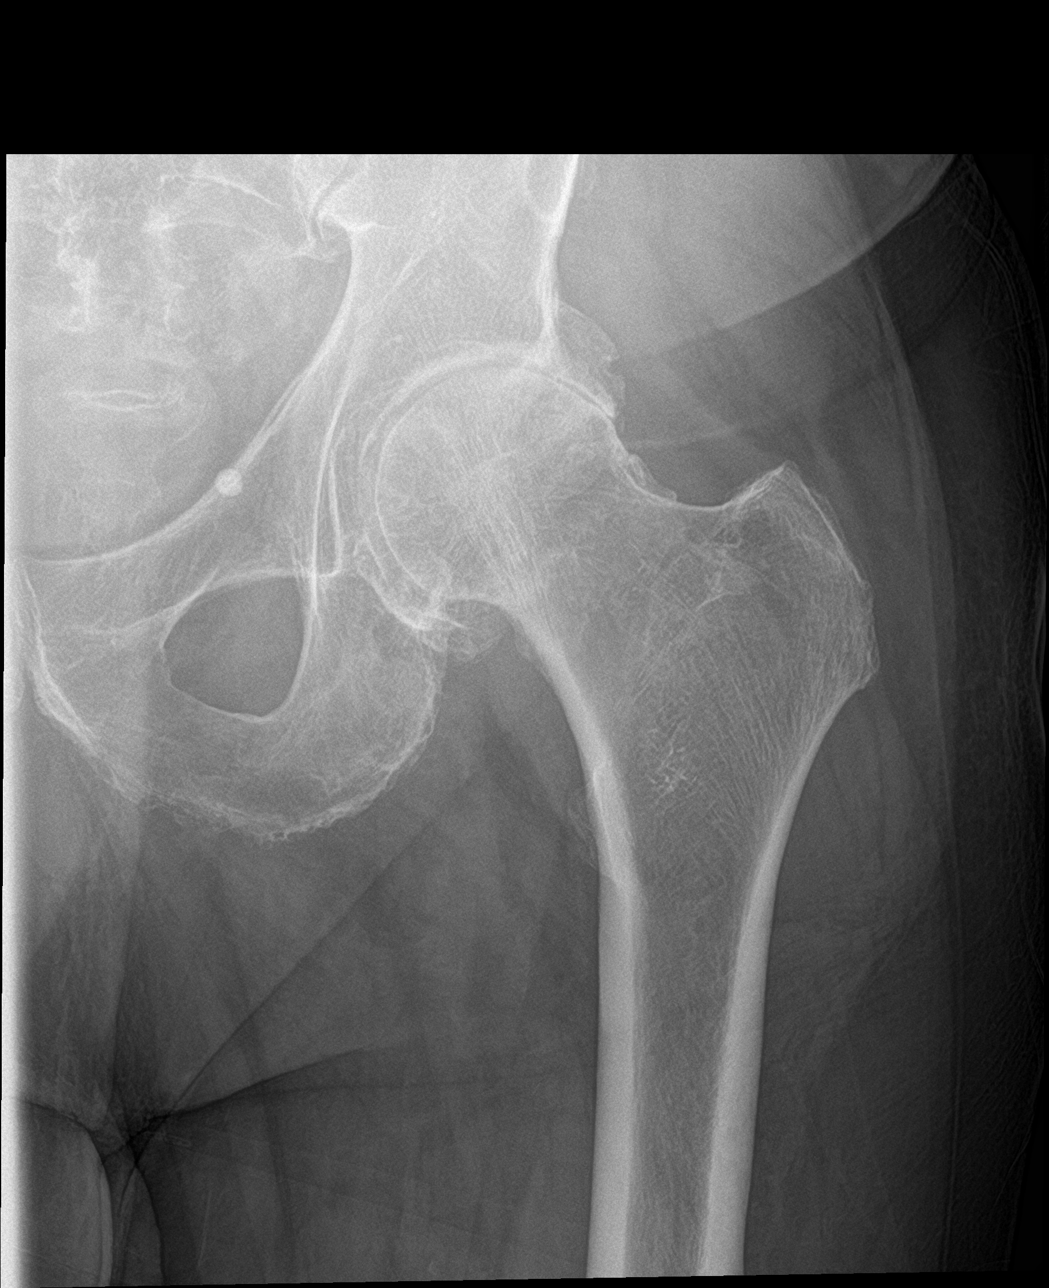

[hip lat]
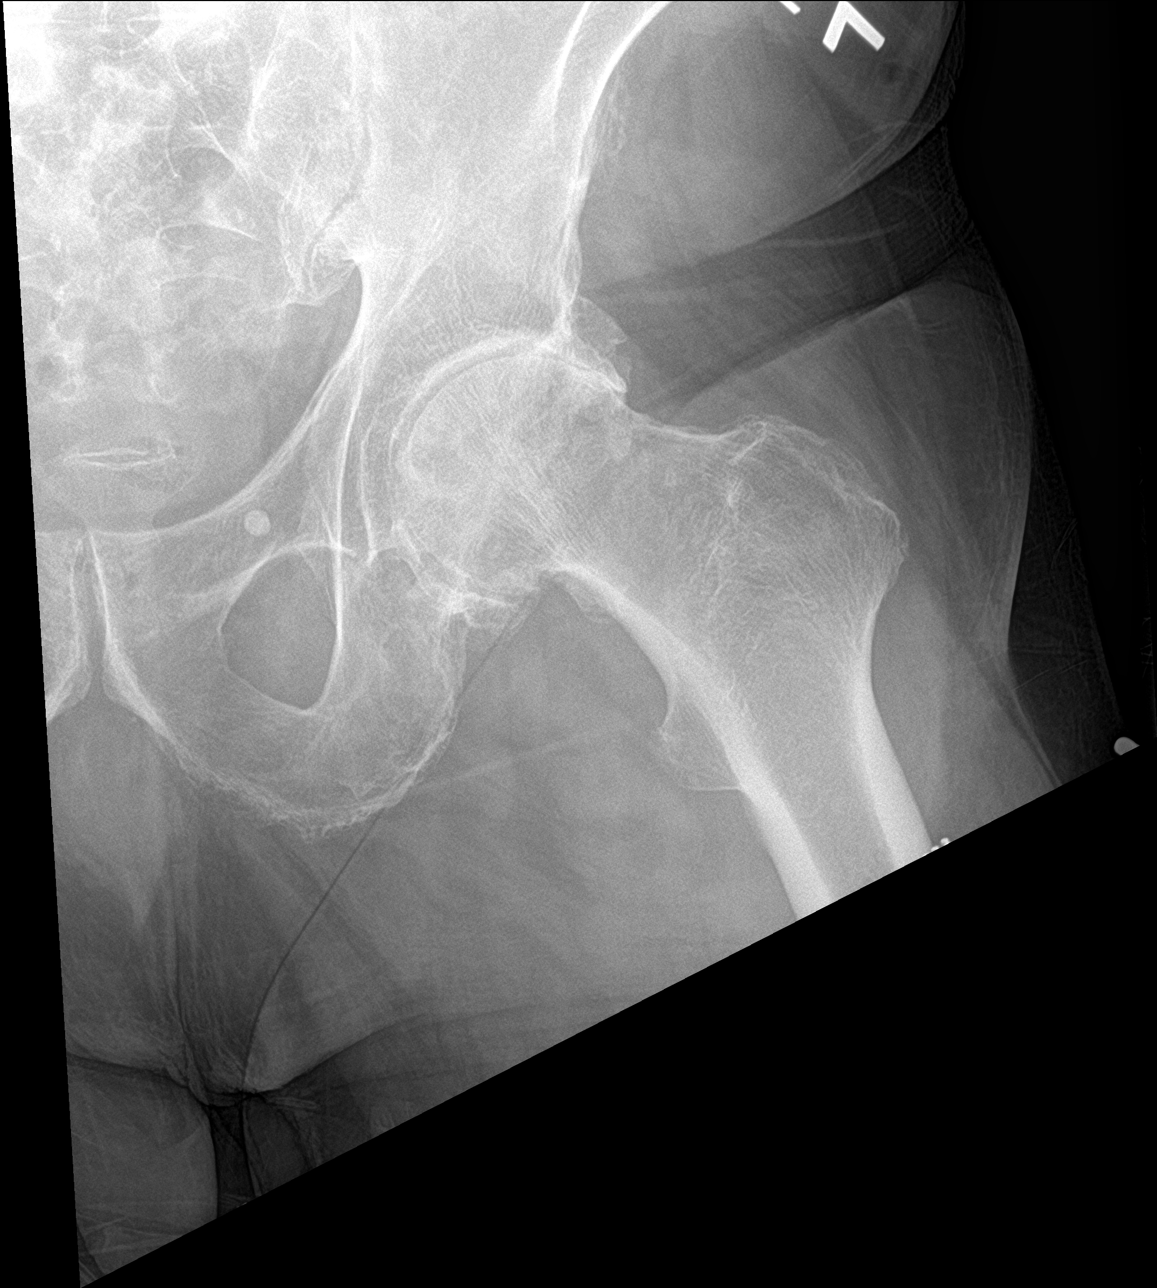

[3 of 3 positions shown; findings below may reference images not displayed]

FINDINGS: There is no evidence of acute fracture or dislocation.

Moderate to severe degenerative changes in both hips are noted.

No suspicious focal bony lesions are present.
IMPRESSION: 1. No evidence of acute abnormality.
2. Moderate to severe degenerative changes in both hips.

## 2022-06-28 IMAGING — DX DG SHOULDER 2+V*L*
2 series · 2 of 2 positions shown · non-contrast
Comparison: None.

CLINICAL DATA: Left shoulder pain after fall today.

EXAM:
LEFT SHOULDER - 2+ VIEW

[shoulder grashey]
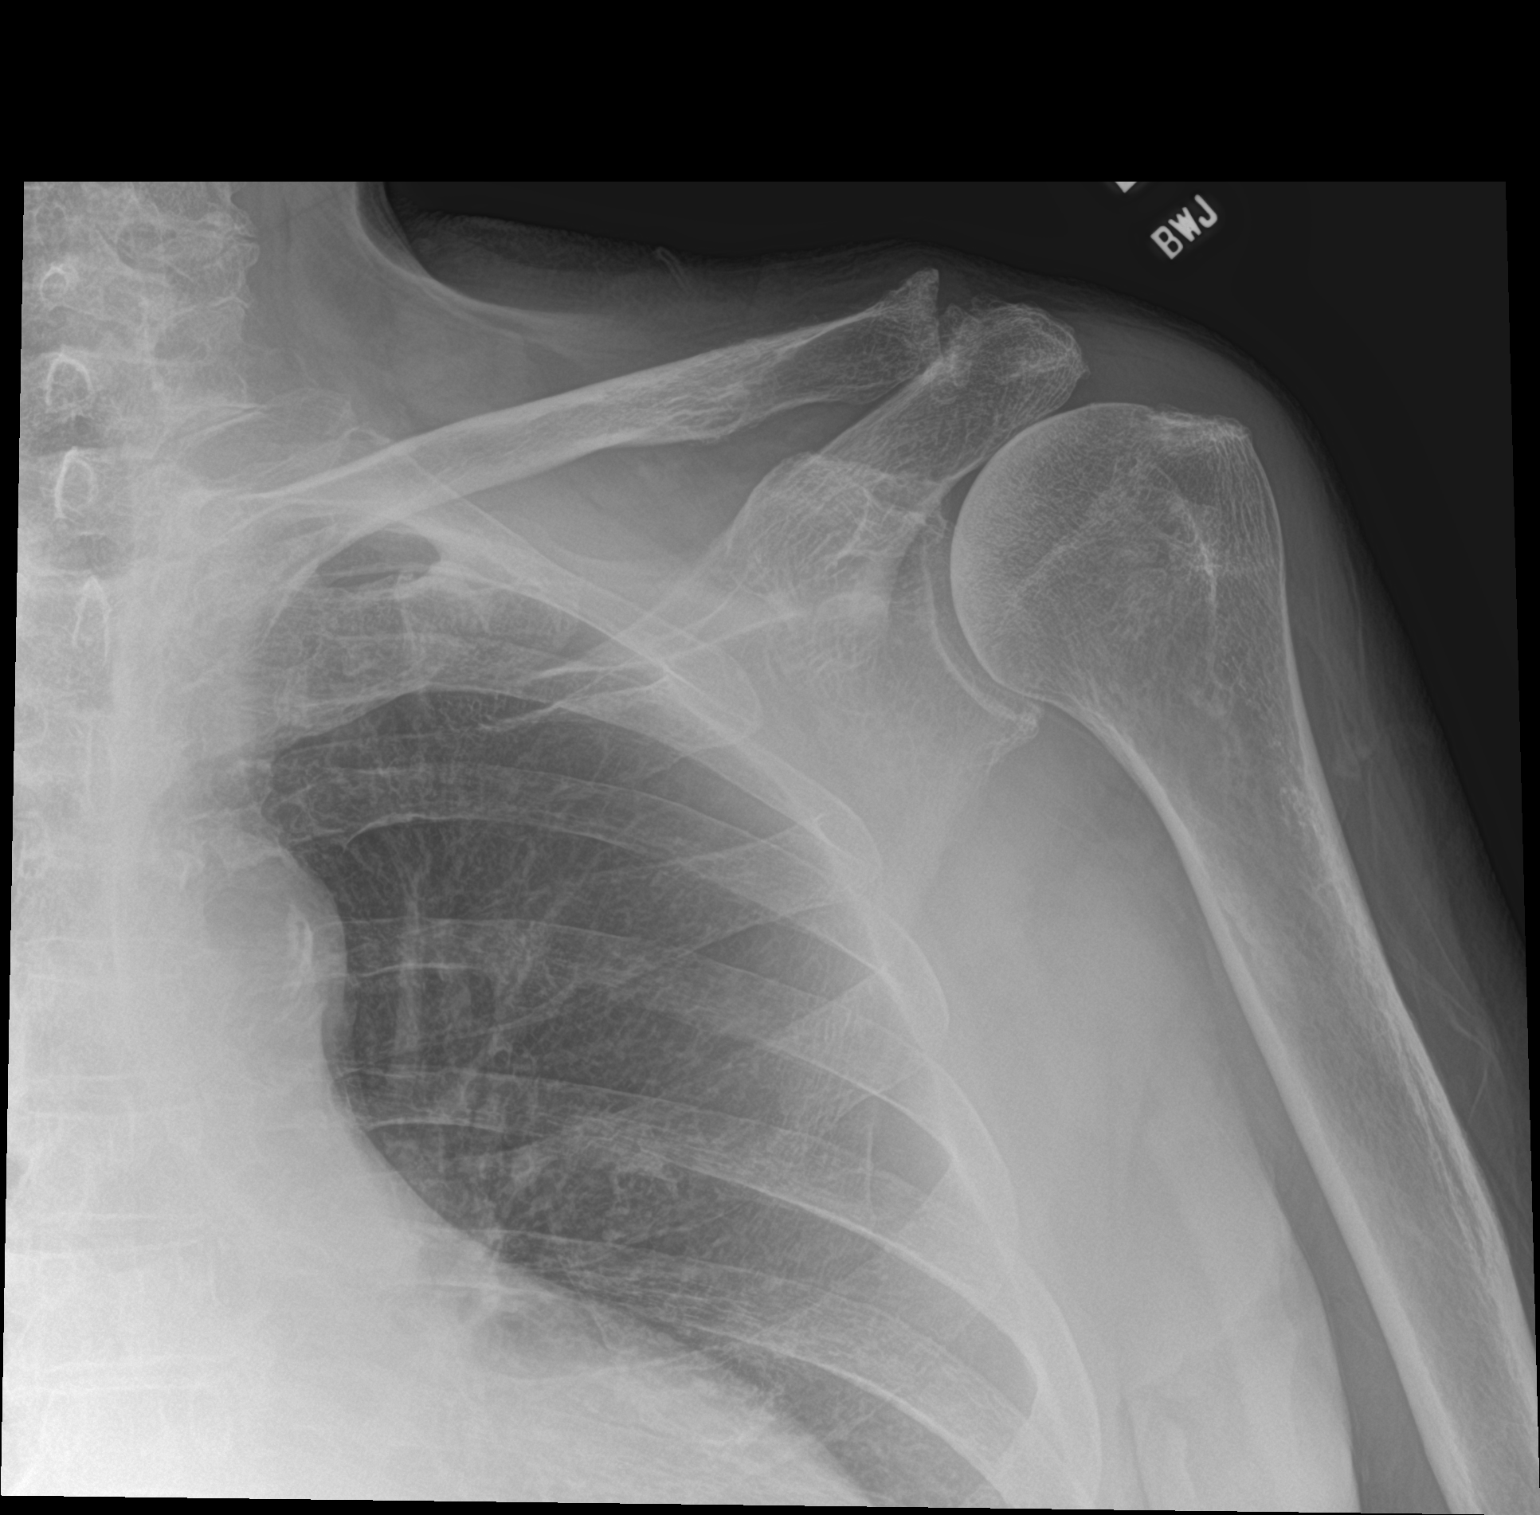

[shoulder y view]
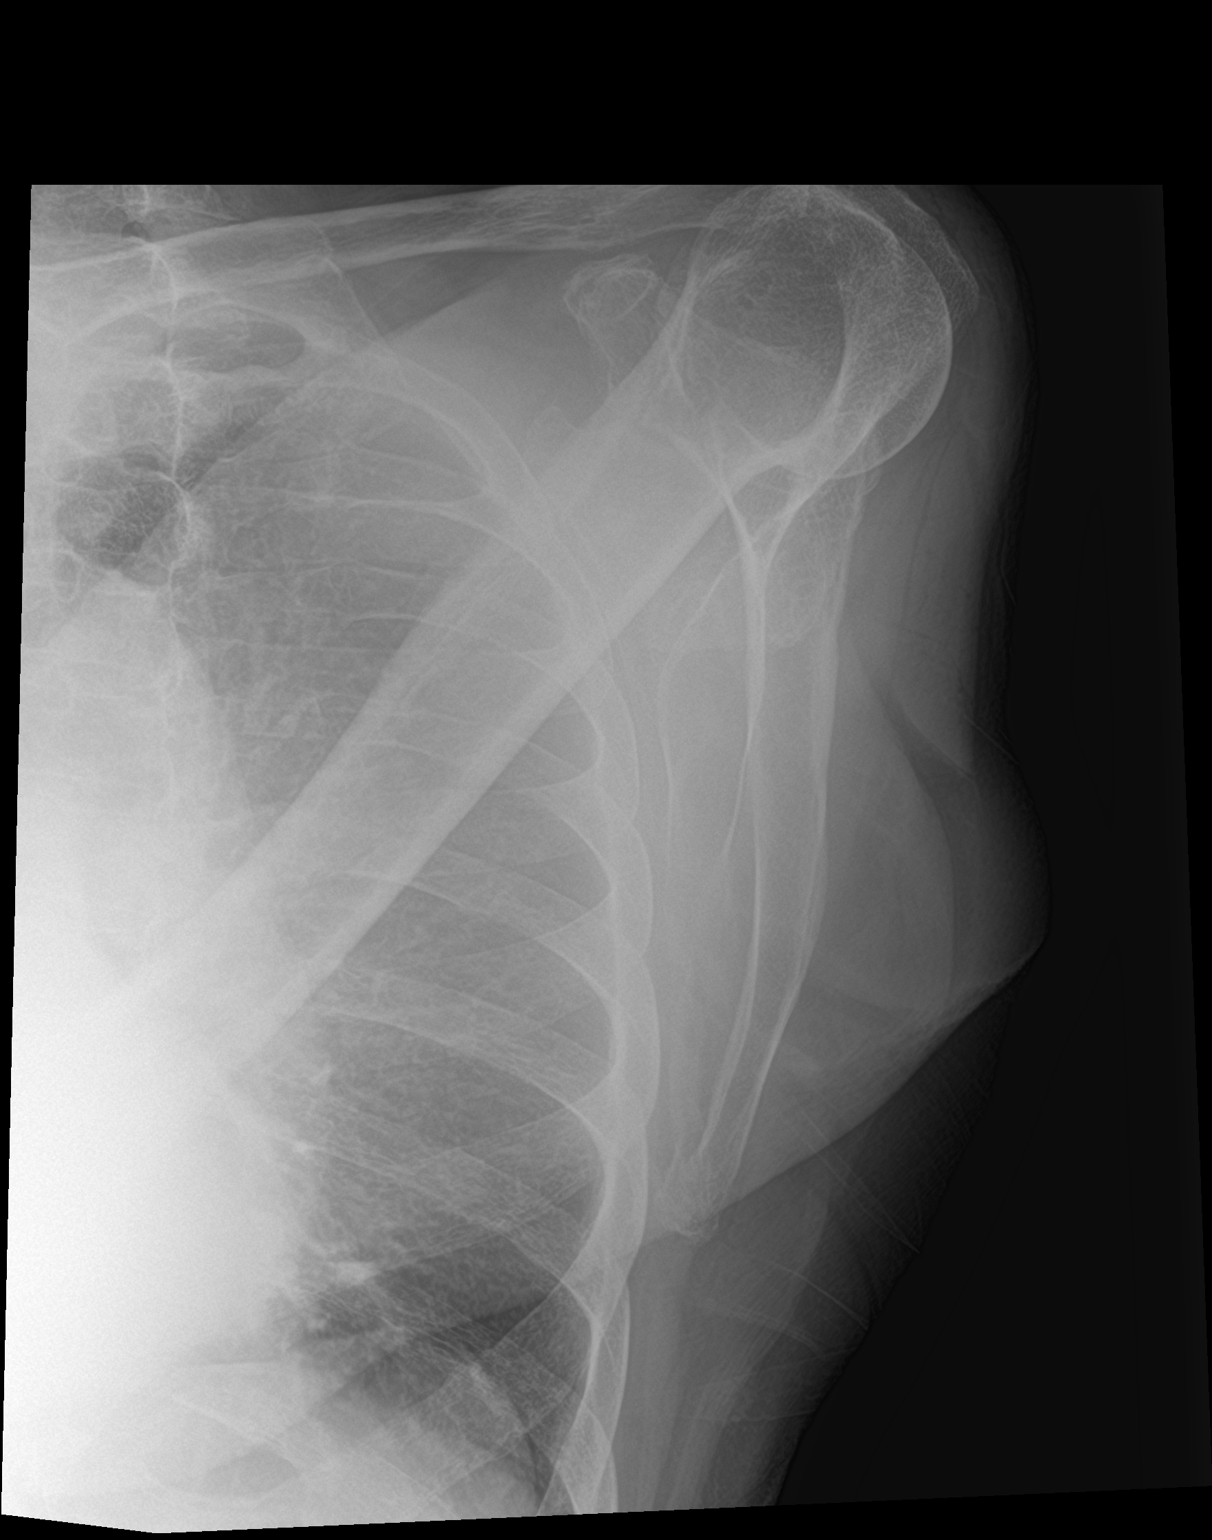

[2 of 2 positions shown; findings below may reference images not displayed]

FINDINGS: There is no evidence of fracture or dislocation. Moderate
degenerative changes seen involving the left acromioclavicular
joint. Soft tissues are unremarkable.
IMPRESSION: Moderate osteoarthritis of the left acromioclavicular joint. No
acute abnormality seen in the left shoulder.

## 2022-08-08 ENCOUNTER — Ambulatory Visit (INDEPENDENT_AMBULATORY_CARE_PROVIDER_SITE_OTHER): Payer: Medicare Other

## 2022-08-08 DIAGNOSIS — Z7901 Long term (current) use of anticoagulants: Secondary | ICD-10-CM | POA: Diagnosis not present

## 2022-08-08 LAB — POCT INR: INR: 2.6 (ref 2.0–3.0)

## 2022-08-08 NOTE — Patient Instructions (Signed)
Continue 1 tablet daily except take 1 1/2 tablets on Mondays, Wednesdays and Fridays.  Recheck in 8 weeks.

## 2022-08-08 NOTE — Progress Notes (Signed)
Continue 1 tablet daily except take 1 1/2 tablets on Mondays, Wednesdays and Fridays.  Recheck in 8 weeks per pt request.  

## 2022-08-16 ENCOUNTER — Encounter (INDEPENDENT_AMBULATORY_CARE_PROVIDER_SITE_OTHER): Payer: Medicare Other | Admitting: Ophthalmology

## 2022-08-16 DIAGNOSIS — H401122 Primary open-angle glaucoma, left eye, moderate stage: Secondary | ICD-10-CM | POA: Diagnosis not present

## 2022-08-16 DIAGNOSIS — H353114 Nonexudative age-related macular degeneration, right eye, advanced atrophic with subfoveal involvement: Secondary | ICD-10-CM | POA: Diagnosis not present

## 2022-08-16 DIAGNOSIS — H353212 Exudative age-related macular degeneration, right eye, with inactive choroidal neovascularization: Secondary | ICD-10-CM | POA: Diagnosis not present

## 2022-08-16 DIAGNOSIS — H353221 Exudative age-related macular degeneration, left eye, with active choroidal neovascularization: Secondary | ICD-10-CM | POA: Diagnosis not present

## 2022-10-01 ENCOUNTER — Other Ambulatory Visit: Payer: Self-pay | Admitting: Family Medicine

## 2022-10-03 ENCOUNTER — Ambulatory Visit (INDEPENDENT_AMBULATORY_CARE_PROVIDER_SITE_OTHER): Payer: Medicare Other

## 2022-10-03 DIAGNOSIS — Z7901 Long term (current) use of anticoagulants: Secondary | ICD-10-CM | POA: Diagnosis not present

## 2022-10-03 LAB — POCT INR: INR: 2.9 (ref 2.0–3.0)

## 2022-10-03 NOTE — Patient Instructions (Addendum)
Pre visit review using our clinic review tool, if applicable. No additional management support is needed unless otherwise documented below in the visit note.  Continue 1 tablet daily except take 1 1/2 tablets on Mondays, Wednesdays and Fridays.  Recheck in 8 weeks. 

## 2022-10-03 NOTE — Progress Notes (Signed)
Continue 1 tablet daily except take 1 1/2 tablets on Mondays, Wednesdays and Fridays.  Recheck in 8 weeks per pt request.  

## 2022-10-04 DIAGNOSIS — H401122 Primary open-angle glaucoma, left eye, moderate stage: Secondary | ICD-10-CM | POA: Diagnosis not present

## 2022-10-04 DIAGNOSIS — H353212 Exudative age-related macular degeneration, right eye, with inactive choroidal neovascularization: Secondary | ICD-10-CM | POA: Diagnosis not present

## 2022-10-04 DIAGNOSIS — H353221 Exudative age-related macular degeneration, left eye, with active choroidal neovascularization: Secondary | ICD-10-CM | POA: Diagnosis not present

## 2022-10-04 DIAGNOSIS — H353114 Nonexudative age-related macular degeneration, right eye, advanced atrophic with subfoveal involvement: Secondary | ICD-10-CM | POA: Diagnosis not present

## 2022-11-22 DIAGNOSIS — H353114 Nonexudative age-related macular degeneration, right eye, advanced atrophic with subfoveal involvement: Secondary | ICD-10-CM | POA: Diagnosis not present

## 2022-11-22 DIAGNOSIS — H353221 Exudative age-related macular degeneration, left eye, with active choroidal neovascularization: Secondary | ICD-10-CM | POA: Diagnosis not present

## 2022-11-22 DIAGNOSIS — H401122 Primary open-angle glaucoma, left eye, moderate stage: Secondary | ICD-10-CM | POA: Diagnosis not present

## 2022-11-22 DIAGNOSIS — H3562 Retinal hemorrhage, left eye: Secondary | ICD-10-CM | POA: Diagnosis not present

## 2022-11-22 DIAGNOSIS — H353212 Exudative age-related macular degeneration, right eye, with inactive choroidal neovascularization: Secondary | ICD-10-CM | POA: Diagnosis not present

## 2022-11-28 ENCOUNTER — Ambulatory Visit (INDEPENDENT_AMBULATORY_CARE_PROVIDER_SITE_OTHER): Payer: Medicare Other

## 2022-11-28 DIAGNOSIS — Z7901 Long term (current) use of anticoagulants: Secondary | ICD-10-CM

## 2022-11-28 LAB — POCT INR: INR: 2.9 (ref 2.0–3.0)

## 2022-11-28 NOTE — Patient Instructions (Addendum)
Pre visit review using our clinic review tool, if applicable. No additional management support is needed unless otherwise documented below in the visit note.  Continue 1 tablet daily except take 1 1/2 tablets on Mondays, Wednesdays and Fridays.  Recheck in 8 weeks.

## 2022-11-28 NOTE — Progress Notes (Signed)
Continue 1 tablet daily except take 1 1/2 tablets on Mondays, Wednesdays and Fridays.  Recheck in 8 weeks per pt request.

## 2022-12-14 DIAGNOSIS — C61 Malignant neoplasm of prostate: Secondary | ICD-10-CM | POA: Diagnosis not present

## 2022-12-27 DIAGNOSIS — H3562 Retinal hemorrhage, left eye: Secondary | ICD-10-CM | POA: Diagnosis not present

## 2022-12-27 DIAGNOSIS — H353221 Exudative age-related macular degeneration, left eye, with active choroidal neovascularization: Secondary | ICD-10-CM | POA: Diagnosis not present

## 2022-12-27 DIAGNOSIS — H353212 Exudative age-related macular degeneration, right eye, with inactive choroidal neovascularization: Secondary | ICD-10-CM | POA: Diagnosis not present

## 2023-01-23 ENCOUNTER — Ambulatory Visit (INDEPENDENT_AMBULATORY_CARE_PROVIDER_SITE_OTHER): Payer: Medicare Other

## 2023-01-23 DIAGNOSIS — Z7901 Long term (current) use of anticoagulants: Secondary | ICD-10-CM

## 2023-01-23 LAB — POCT INR: INR: 2.2 (ref 2.0–3.0)

## 2023-01-23 NOTE — Progress Notes (Signed)
Continue 1 tablet daily except take 1 1/2 tablets on Mondays, Wednesdays and Fridays.  Recheck in 8 weeks per pt request.  

## 2023-01-23 NOTE — Patient Instructions (Addendum)
Pre visit review using our clinic review tool, if applicable. No additional management support is needed unless otherwise documented below in the visit note.  Continue 1 tablet daily except take 1 1/2 tablets on Mondays, Wednesdays and Fridays.  Recheck in 8 weeks per pt request.

## 2023-01-31 DIAGNOSIS — H353221 Exudative age-related macular degeneration, left eye, with active choroidal neovascularization: Secondary | ICD-10-CM | POA: Diagnosis not present

## 2023-01-31 DIAGNOSIS — H353123 Nonexudative age-related macular degeneration, left eye, advanced atrophic without subfoveal involvement: Secondary | ICD-10-CM | POA: Diagnosis not present

## 2023-01-31 DIAGNOSIS — H353212 Exudative age-related macular degeneration, right eye, with inactive choroidal neovascularization: Secondary | ICD-10-CM | POA: Diagnosis not present

## 2023-01-31 DIAGNOSIS — H3562 Retinal hemorrhage, left eye: Secondary | ICD-10-CM | POA: Diagnosis not present

## 2023-01-31 DIAGNOSIS — H02036 Senile entropion of left eye, unspecified eyelid: Secondary | ICD-10-CM | POA: Diagnosis not present

## 2023-01-31 DIAGNOSIS — H401122 Primary open-angle glaucoma, left eye, moderate stage: Secondary | ICD-10-CM | POA: Diagnosis not present

## 2023-01-31 DIAGNOSIS — H353114 Nonexudative age-related macular degeneration, right eye, advanced atrophic with subfoveal involvement: Secondary | ICD-10-CM | POA: Diagnosis not present

## 2023-02-02 DIAGNOSIS — H43811 Vitreous degeneration, right eye: Secondary | ICD-10-CM | POA: Diagnosis not present

## 2023-02-02 DIAGNOSIS — H353114 Nonexudative age-related macular degeneration, right eye, advanced atrophic with subfoveal involvement: Secondary | ICD-10-CM | POA: Diagnosis not present

## 2023-02-02 DIAGNOSIS — H353221 Exudative age-related macular degeneration, left eye, with active choroidal neovascularization: Secondary | ICD-10-CM | POA: Diagnosis not present

## 2023-02-02 DIAGNOSIS — Z9889 Other specified postprocedural states: Secondary | ICD-10-CM | POA: Diagnosis not present

## 2023-02-13 DIAGNOSIS — Z8546 Personal history of malignant neoplasm of prostate: Secondary | ICD-10-CM | POA: Diagnosis not present

## 2023-02-13 DIAGNOSIS — R350 Frequency of micturition: Secondary | ICD-10-CM | POA: Diagnosis not present

## 2023-03-01 DIAGNOSIS — C61 Malignant neoplasm of prostate: Secondary | ICD-10-CM | POA: Diagnosis not present

## 2023-03-02 DIAGNOSIS — H353212 Exudative age-related macular degeneration, right eye, with inactive choroidal neovascularization: Secondary | ICD-10-CM | POA: Diagnosis not present

## 2023-03-02 DIAGNOSIS — H353123 Nonexudative age-related macular degeneration, left eye, advanced atrophic without subfoveal involvement: Secondary | ICD-10-CM | POA: Diagnosis not present

## 2023-03-02 DIAGNOSIS — H353114 Nonexudative age-related macular degeneration, right eye, advanced atrophic with subfoveal involvement: Secondary | ICD-10-CM | POA: Diagnosis not present

## 2023-03-02 DIAGNOSIS — H401122 Primary open-angle glaucoma, left eye, moderate stage: Secondary | ICD-10-CM | POA: Diagnosis not present

## 2023-03-02 DIAGNOSIS — H353221 Exudative age-related macular degeneration, left eye, with active choroidal neovascularization: Secondary | ICD-10-CM | POA: Diagnosis not present

## 2023-03-10 ENCOUNTER — Other Ambulatory Visit: Payer: Self-pay

## 2023-03-10 ENCOUNTER — Encounter (HOSPITAL_COMMUNITY): Payer: Self-pay | Admitting: Emergency Medicine

## 2023-03-10 ENCOUNTER — Emergency Department (HOSPITAL_COMMUNITY): Payer: Medicare Other

## 2023-03-10 ENCOUNTER — Emergency Department (HOSPITAL_COMMUNITY)
Admission: EM | Admit: 2023-03-10 | Discharge: 2023-03-10 | Disposition: A | Discharge status: Home / Self Care | Payer: Medicare Other | Attending: Emergency Medicine | Admitting: Emergency Medicine

## 2023-03-10 DIAGNOSIS — N281 Cyst of kidney, acquired: Secondary | ICD-10-CM | POA: Diagnosis not present

## 2023-03-10 DIAGNOSIS — K8689 Other specified diseases of pancreas: Secondary | ICD-10-CM | POA: Diagnosis not present

## 2023-03-10 DIAGNOSIS — Z7901 Long term (current) use of anticoagulants: Secondary | ICD-10-CM | POA: Insufficient documentation

## 2023-03-10 DIAGNOSIS — R1032 Left lower quadrant pain: Secondary | ICD-10-CM | POA: Diagnosis not present

## 2023-03-10 DIAGNOSIS — R1900 Intra-abdominal and pelvic swelling, mass and lump, unspecified site: Secondary | ICD-10-CM | POA: Diagnosis not present

## 2023-03-10 DIAGNOSIS — R1031 Right lower quadrant pain: Secondary | ICD-10-CM | POA: Diagnosis not present

## 2023-03-10 DIAGNOSIS — Z79899 Other long term (current) drug therapy: Secondary | ICD-10-CM | POA: Diagnosis not present

## 2023-03-10 DIAGNOSIS — R1084 Generalized abdominal pain: Secondary | ICD-10-CM | POA: Insufficient documentation

## 2023-03-10 DIAGNOSIS — R630 Anorexia: Secondary | ICD-10-CM | POA: Insufficient documentation

## 2023-03-10 DIAGNOSIS — R930 Abnormal findings on diagnostic imaging of skull and head, not elsewhere classified: Secondary | ICD-10-CM | POA: Diagnosis not present

## 2023-03-10 DIAGNOSIS — K5730 Diverticulosis of large intestine without perforation or abscess without bleeding: Secondary | ICD-10-CM | POA: Diagnosis not present

## 2023-03-10 DIAGNOSIS — K6389 Other specified diseases of intestine: Secondary | ICD-10-CM | POA: Diagnosis not present

## 2023-03-10 LAB — CBC WITH DIFFERENTIAL/PLATELET
Abs Immature Granulocytes: 0.02 10*3/uL (ref 0.00–0.07)
Basophils Absolute: 0 10*3/uL (ref 0.0–0.1)
Basophils Relative: 0 %
Eosinophils Absolute: 0.1 10*3/uL (ref 0.0–0.5)
Eosinophils Relative: 2 %
HCT: 42.7 % (ref 39.0–52.0)
Hemoglobin: 14.7 g/dL (ref 13.0–17.0)
Immature Granulocytes: 0 %
Lymphocytes Relative: 19 %
Lymphs Abs: 1.3 10*3/uL (ref 0.7–4.0)
MCH: 31.8 pg (ref 26.0–34.0)
MCHC: 34.4 g/dL (ref 30.0–36.0)
MCV: 92.4 fL (ref 80.0–100.0)
Monocytes Absolute: 0.6 10*3/uL (ref 0.1–1.0)
Monocytes Relative: 9 %
Neutro Abs: 4.8 10*3/uL (ref 1.7–7.7)
Neutrophils Relative %: 70 %
Platelets: 253 10*3/uL (ref 150–400)
RBC: 4.62 MIL/uL (ref 4.22–5.81)
RDW: 14.5 % (ref 11.5–15.5)
WBC: 6.9 10*3/uL (ref 4.0–10.5)
nRBC: 0 % (ref 0.0–0.2)

## 2023-03-10 LAB — COMPREHENSIVE METABOLIC PANEL
ALT: 17 U/L (ref 0–44)
AST: 26 U/L (ref 15–41)
Albumin: 3.9 g/dL (ref 3.5–5.0)
Alkaline Phosphatase: 171 U/L — ABNORMAL HIGH (ref 38–126)
Anion gap: 12 (ref 5–15)
BUN: 13 mg/dL (ref 8–23)
CO2: 25 mmol/L (ref 22–32)
Calcium: 9.4 mg/dL (ref 8.9–10.3)
Chloride: 99 mmol/L (ref 98–111)
Creatinine, Ser: 0.92 mg/dL (ref 0.61–1.24)
GFR, Estimated: >60 mL/min (ref >60)
Glucose, Bld: 104 mg/dL — ABNORMAL HIGH (ref 70–99)
Potassium: 4.1 mmol/L (ref 3.5–5.1)
Sodium: 136 mmol/L (ref 135–145)
Total Bilirubin: 0.7 mg/dL (ref 0.3–1.2)
Total Protein: 7.6 g/dL (ref 6.5–8.1)

## 2023-03-10 LAB — URINALYSIS, ROUTINE W REFLEX MICROSCOPIC
Bacteria, UA: NONE SEEN
Bilirubin Urine: NEGATIVE
Glucose, UA: NEGATIVE mg/dL
Ketones, ur: NEGATIVE mg/dL
Leukocytes,Ua: NEGATIVE
Nitrite: NEGATIVE
Protein, ur: NEGATIVE mg/dL
Specific Gravity, Urine: 1.006 (ref 1.005–1.030)
pH: 6 (ref 5.0–8.0)

## 2023-03-10 LAB — LIPASE, BLOOD: Lipase: 35 U/L (ref 11–51)

## 2023-03-10 MED ORDER — GADOBUTROL 1 MMOL/ML IV SOLN
6.5000 mL | Freq: Once | INTRAVENOUS | Status: AC | PRN
  Administered 2023-03-10: 6.5 mL via INTRAVENOUS

## 2023-03-10 MED ORDER — IOHEXOL 300 MG/ML  SOLN
100.0000 mL | Freq: Once | INTRAMUSCULAR | Status: AC | PRN
  Administered 2023-03-10: 100 mL via INTRAVENOUS

## 2023-03-10 NOTE — Discharge Instructions (Addendum)
Return if any problems.  You need to see your urologist and have them review your MRI.

## 2023-03-10 NOTE — ED Notes (Signed)
MRI called to say they will be here to get pt in just a "little bit"

## 2023-03-10 NOTE — ED Triage Notes (Addendum)
Pt via POV c/o umbilical hernia pain since 0830 this morning. He states he strained to have a bowel movement and then tightened his belt after he stood up, and then his hernia began hurting much more than normal. Pt also has a right inguinal hernia but it is not hurting at this time. In triage, pt states he has no pain at the moment. Bradycardia noted, consistent with baseline per pt report.

## 2023-03-11 NOTE — ED Provider Notes (Cosign Needed Addendum)
[place] EMERGENCY DEPARTMENT AT [facility] Provider Note   CSN: [id number] Arrival date & time: 03/10/23  1127     History  Chief Complaint  Patient presents with   Abdominal Pain    Mark Wu is a 89 y.o. male.  Patient complains of abdominal pain.  Patient reports that he has a hernia and he thinks he put his belt on too tight causing him to have pain at the site of his hernia.  Patient reports hernia is currently reduced.  Patient is still having some discomfort.  Patient denies any fever or chills he denies any nausea or vomiting.  Patient is currently being seen by urology for prostate cancer.  Patient reports poor appetite he reports he has lost approximately 15 pounds in the last 6 months.  The history is provided by the patient and the spouse. No language interpreter was used.  Abdominal Pain Pain location:  Generalized Pain radiates to:  Does not radiate Pain severity:  Moderate Onset quality:  Sudden Duration:  1 day Timing:  Constant Chronicity:  New Relieved by:  Nothing Ineffective treatments:  None tried Associated symptoms: anorexia   Risk factors: no alcohol abuse and no aspirin use        Home Medications Prior to Admission medications   Medication Sig Start Date End Date Taking? Authorizing Provider  amLODipine (NORVASC) 5 MG tablet TAKE 1 TABLET DAILY 10/03/22   Burchette, Elberta Fortis, MD  Calcium Carb-Cholecalciferol (CALCIUM CARBONATE-VITAMIN D3 PO) Take by mouth.    [provider]  clotrimazole-betamethasone (LOTRISONE) cream APPLY TOPICALLY AS NEEDED 10/29/21   Burchette, Elberta Fortis, MD  latanoprost (XALATAN) 0.005 % ophthalmic solution INSTILL 1 DROP IN THE LEFT EYE AT BEDTIME (DISCARD 42 DAYS AFTER OPENING) 04/13/22   Rankin, Alford Highland, MD  warfarin (COUMADIN) 2.5 MG tablet TAKE 1 1/2 TABLETS BY MOUTH DAILY EXCEPT TAKE 1 TABLET ON SUNDAYS, TUESDAYS AND THURSDAYS OR TAKE AS DIRECTED BY ANTICOAGULATION CLINIC 05/11/22   Burchette, Elberta Fortis, MD      Allergies    Patient has no known allergies.    Review of Systems   Review of Systems  Gastrointestinal:  Positive for abdominal pain and anorexia.  All other systems reviewed and are negative.   Physical Exam Updated Vital Signs BP (!) 150/65   Pulse 60   Temp 97.8 F (36.6 C) (Oral)   Resp 17   Ht 5\' 9"  (1.753 m)   Wt 68 kg   SpO2 99%   BMI 22.15 kg/m  Physical Exam Vitals and nursing note reviewed.  Constitutional:      Appearance: He is well-developed.  HENT:     Head: Normocephalic.  Cardiovascular:     Rate and Rhythm: Normal rate.  Pulmonary:     Effort: Pulmonary effort is normal.  Abdominal:     General: Bowel sounds are normal. There is no distension.     Palpations: Abdomen is soft.     Tenderness: There is abdominal tenderness in the right lower quadrant and left lower quadrant.     Hernia: A hernia is present.  Musculoskeletal:        General: Normal range of motion.     Cervical back: Normal range of motion.  Skin:    General: Skin is warm.  Neurological:     Mental Status: He is alert and oriented to person, place, and time.     ED Results / Procedures / Treatments   Labs (all labs  ordered are listed, but only abnormal results are displayed) Labs Reviewed  COMPREHENSIVE METABOLIC PANEL - Abnormal; Notable for the following components:      Result Value   Glucose, Bld 104 (*)    Alkaline Phosphatase 171 (*)    All other components within normal limits  URINALYSIS, ROUTINE W REFLEX MICROSCOPIC - Abnormal; Notable for the following components:   Color, Urine STRAW (*)    Hgb urine dipstick SMALL (*)    All other components within normal limits  CBC WITH DIFFERENTIAL/PLATELET  LIPASE, BLOOD    EKG None  Radiology MR Abdomen W or Wo Contrast  Result Date: 03/10/2023 CLINICAL DATA:  Evaluate abdominal mass.  Choose 1 scratch EXAM: MRI ABDOMEN WITHOUT AND WITH CONTRAST TECHNIQUE: Multiplanar multisequence MR imaging of the  abdomen was performed both before and after the administration of intravenous contrast. CONTRAST:  6.21mL GADAVIST GADOBUTROL 1 MMOL/ML IV SOLN COMPARISON:  CT from earlier today.  CT report from 05/19/2017 FINDINGS: Lower chest: No acute findings. Hepatobiliary: Exam detail is diminished due to motion artifact. The subcentimeter peripheral enhancing foci within the anterior right lobe of liver is again seen and is favored to represent a benign arteriovenous malformation, image 21/16. The second lesion along the lateral aspect of the right lobe is not confidently identified on this exam. As noted previously there is a mass within the right upper quadrant of the abdomen which is intimately associated with the posterior right hepatic lobe and right adrenal gland. This appears centered within the right adrenal fossa measuring 7.3 x 6.6 by 5.6 cm, image 11/7. This is mildly T1 hypointense and T2 hypointense with some scattered internal areas of increased T2 signal. On the postcontrast images there is diffuse heterogeneous enhancement throughout this structure. Gallbladder appears normal. No bile duct dilatation. The liver is otherwise unremarkable. Pancreas: No inflammation identified. No main duct dilatation. 8 mm cystic lesion identified within the neck of pancreas, image 21/7. Spleen:  Within normal limits in size and appearance. Adrenals/Urinary Tract: The left adrenal gland appears normal. Central right upper lobe mass is favored to be adrenal in origin measuring 7.3 x 6.6 x 5.6 cm. This shows diffuse heterogeneous enhancement with some small internal areas of cystic change. Benign Bosniak class 1 left kidney cysts measure up to 1.4 cm. No follow-up imaging recommended. Stomach/Bowel: Stomach is within normal limits. No dilated bowel loops identified.Focal area of enhancement involving the descending colon is again noted, image 46/4. This is incompletely characterized. Vascular/Lymphatic: Normal caliber of the  abdominal aorta. No adenopathy identified. Other:  No ascites. Musculoskeletal: No suspicious bone lesions identified. IMPRESSION: 1. There is a heterogeneous, enhancing mass within the right upper quadrant of the abdomen which is intimately associated with the posterior right hepatic lobe and right adrenal gland. This is favored to be adrenal in origin. Differential considerations include metastatic disease versus primary adrenal neoplasm. Recommend referral to urology for further management. 2. Focal area of enhancement involving the descending colon is again noted. This is incompletely characterized. Further evaluation with colonoscopy is advised. 3. 8 mm cystic lesion within the neck of pancreas. According to consensus criteria follow-up imaging in 24 months with pancreas protocol MRI is recommended. 4. Bosniak class 1 left kidney cysts. No follow-up imaging recommended. Electronically Signed   By: Signa Kell M.D.   On: 03/10/2023 18:15   CT ABDOMEN PELVIS W CONTRAST  Result Date: 03/10/2023 CLINICAL DATA:  Acute abdominal pain. Patient reports umbilical pain since this morning. EXAM: CT ABDOMEN  AND PELVIS WITH CONTRAST TECHNIQUE: Multidetector CT imaging of the abdomen and pelvis was performed using the standard protocol following bolus administration of intravenous contrast. RADIATION DOSE REDUCTION: This exam was performed according to the departmental dose-optimization program which includes automated exposure control, adjustment of the mA and/or kV according to patient size and/or use of iterative reconstruction technique. CONTRAST:  OMNIPAQUE IOHEXOL 300 MG/ML  SOLN COMPARISON:  Report from CT 05/19/2017, images unavailable FINDINGS: Lower chest: Tiny punctate right lower lobe nodule series 5, image 2. Otherwise clear lung bases. Hepatobiliary: Heterogeneous central right upper quadrant mass is intimately related to both the posterior right lobe of the liver as well as the right adrenal  gland, exact etiology is difficult to determine. This has both enhancing as well as cystic components and was not described on prior exam. Lesion measures 8.2 x 6.6 cm, series 2, image 11. Favor adrenal origin, however there is lack of fat plane between this lesion in the right lobe of the liver. Occasional small enhancing foci in the periphery of the liver for example series 2, image 17 and series 2, image 13. physiologically distended gallbladder. No calcified gallstone or pericholecystic inflammation. There is no biliary dilatation. Pancreas: Parenchymal atrophy. A 7 x 5 mm cyst in the pancreatic neck, series 2, image 27. No ductal dilatation or inflammation. Spleen: Normal in size without focal abnormality. Adrenals/Urinary Tract: Heterogeneous central right upper quadrant mass is intimately related to both the posterior right lobe of the liver as well as the right adrenal gland, origin is difficult to delineate, however adrenal origin is favored. This measures 8.2 x 6.6 cm, series 2, image 11. There both enhancing as well as cystic components. The left adrenal gland is normal. No hydronephrosis. Homogeneous renal enhancement. Symmetric renal excretion. No renal calculi. Small cyst in the upper left kidney as well as low-density lesions too small to characterize. No specific imaging follow-up is recommended. The dome of the urinary bladder deviates into the left pelvis and approaches the left internal inguinal ring. There may be mild wall thickening about the bladder dome. Stomach/Bowel: Small right inguinal hernia contains short segment of small bowel. There is no associated wall thickening or obstruction. Small hiatal hernia. The stomach is nondistended. There is mild fecalization of distal small bowel contents. Normal appendix. Enhancing area of circumferential wall thickening in the proximal descending colon, series 4, image 52. Possible but not definite apple-core lesion. No adjacent fat stranding or  inflammation. Colonic diverticulosis which is most prominent in the sigmoid. There is faint fat stranding adjacent to the proximal sigmoid, series 2, image 58, that may represent mild acute diverticulitis. No abscess or perforation. Vascular/Lymphatic: Moderate to advanced aortic atherosclerosis. No aortic aneurysm. The portal vein is patent. There is contrast mixing at the portal splenic confluence. No definite enlarged lymph nodes in the abdomen or pelvis. Reproductive: Heterogeneous prostate gland. Other: Small right inguinal hernia contains short segment of small bowel. Small left inguinal hernia contains only fat. There is a small fat containing umbilical hernia without inflammation. No ascites. No free air or focal fluid collection. Musculoskeletal: Diffuse degenerative change in the spine. Advanced left and moderate to advanced right hip osteoarthritis. The bones are subjectively under mineralized. Allowing for this, there is no evidence of focal bone lesion. IMPRESSION: 1. Colonic diverticulosis with faint fat stranding adjacent to the proximal sigmoid, that may represent mild acute diverticulitis. No abscess or perforation. 2. Heterogeneous central right upper quadrant mass measuring 8.2 x 6.6 cm. This is  intimately related to both the posterior right lobe of the liver as well as the right adrenal gland, exact etiology is difficult to delineate, however adrenal origin is favored. This is new from 2018 exam and suspicious for neoplasm/metastasis. If patient is able to tolerate breath hold technique, recommend abdominal MRI for further assessment. There also 2 small subcentimeter enhancing foci in the periphery of the liver that are nonspecific. 3. Enhancing area of circumferential wall thickening in the proximal descending colon. Possible but not definite apple-core lesion. Neoplasm is considered. Recommend correlation with colonoscopy. 4. Small right inguinal hernia contains short segment of small bowel. No  associated bowel obstruction or inflammation. Small fat containing umbilical and left inguinal hernias. 5. Tiny punctate right lower lobe pulmonary nodule. Nodule follow-up will be dependent on right upper quadrant lesion assessment. 6. Small 7 x 5 mm cyst in the pancreatic neck, nonspecific. 7. Small hiatal hernia. Aortic Atherosclerosis (ICD10-I70.0). Electronically Signed   By: Narda Rutherford M.D.   On: 03/10/2023 15:52    Procedures Procedures    Medications Ordered in ED Medications  iohexol (OMNIPAQUE) 300 MG/ML solution 100 mL (100 mLs Intravenous Contrast Given 03/10/23 1524)  gadobutrol (GADAVIST) 1 MMOL/ML injection 6.5 mL (6.5 mLs Intravenous Contrast Given 03/10/23 1748)    ED Course/ Medical Decision Making/ A&P Clinical Course as of 03/11/23 1630  Fri Mar 10, 2023  4625 89 year old male here with abdominal pain.  He attributes it to an umbilical hernia after he tighten his belt this morning, lasted a few hours resolved now.  His abdomen is soft without significant mass.  Getting labs and CT imaging.  Disposition per results of testing. [MB]    Clinical Course User Index [MB] Terrilee Files, MD                             Medical Decision Making Patient complains of lower abdominal pain after he put his belt on too tight.  Patient reports that he thinks his belt may have aggravated his abdominal hernia.  Patient is currently being evaluated for prostate cancer  Amount and/or Complexity of Data Reviewed Independent Historian: spouse    Details: Patient's wife reports patient was having significant pain earlier. External Data Reviewed: notes.    Details: Primary care and urology notes reviewed Labs: ordered. Decision-making details documented in ED Course.    Details: Labs ordered reviewed and interpreted patient has a normal hemoglobin normal white blood cell count renal functions are normal Radiology: ordered and independent interpretation performed. Decision-making  details documented in ED Course.    Details: CT scan shows possible colon lesion, diverticulosis and possible posterior hepatic mass.  Radiologist advised MRI.  MRI shows enhancing mass right upper quadrant of abdomen colon wall thickening small hernia and cyst in pancreatic neck.  Risk Prescription drug management. Risk Details: Patient counseled on findings on CT and MRI scan he is advised to follow-up with his primary care for physician for evaluation.  Patient is also advised to follow-up with his urologist as scheduled.  Patient has been seen by Dr. Russella Dar with GI in the past he is advised that he should see GI for evaluation of possible colon mass.  Patient denies any current pain he reports that he is feeling better patient is discharged home in stable condition           Final Clinical Impression(s) / ED Diagnoses Final diagnoses:  Generalized abdominal pain  Abdominal mass,  unspecified abdominal location    Rx / DC Orders ED Discharge Orders     None      An After Visit Summary was printed and given to the patient.    Elson Areas, PA-C 03/11/23 1638    Elson Areas, PA-C 03/15/23 1936    Terrilee Files, MD 03/16/23 [phone]

## 2023-03-15 ENCOUNTER — Ambulatory Visit (INDEPENDENT_AMBULATORY_CARE_PROVIDER_SITE_OTHER): Payer: Medicare Other | Admitting: Internal Medicine

## 2023-03-15 ENCOUNTER — Telehealth: Payer: Self-pay | Admitting: Gastroenterology

## 2023-03-15 ENCOUNTER — Encounter: Payer: Self-pay | Admitting: Internal Medicine

## 2023-03-15 VITALS — BP 144/65 | HR 57 | Temp 97.5°F | Wt 154.3 lb

## 2023-03-15 DIAGNOSIS — Z09 Encounter for follow-up examination after completed treatment for conditions other than malignant neoplasm: Secondary | ICD-10-CM

## 2023-03-15 DIAGNOSIS — K6389 Other specified diseases of intestine: Secondary | ICD-10-CM

## 2023-03-15 DIAGNOSIS — R634 Abnormal weight loss: Secondary | ICD-10-CM

## 2023-03-15 NOTE — Telephone Encounter (Signed)
Urgent referral in [place] for weight loss and colonic mass. (Records in [facility]).  Last saw Dr. Russella Dar in 2017.  Please review and advise scheduling and urgency.  Thanks

## 2023-03-15 NOTE — Progress Notes (Signed)
Established Patient Office Visit     CC/Reason for Visit: ED follow-up  HPI: Mark Wu is a 89 y.o. male who is coming in today for the above mentioned reasons.  He has been losing weight unintentionally.  He is followed by Dr. Caryl Never.  Here today with wife and daughter-in-law to follow-up on ED visit.  He is extremely hard of hearing.  He went to the emergency department on April 24 with abdominal pain.  I will insert CT and MRI results below:  CT abdomen and pelvis with contrast:  IMPRESSION: 1. Colonic diverticulosis with faint fat stranding adjacent to the proximal sigmoid, that may represent mild acute diverticulitis. No abscess or perforation. 2. Heterogeneous central right upper quadrant mass measuring 8.2 x 6.6 cm. This is intimately related to both the posterior right lobe of the liver as well as the right adrenal gland, exact etiology is difficult to delineate, however adrenal origin is favored. This is new from 04/07/2017 exam and suspicious for neoplasm/metastasis. If patient is able to tolerate breath hold technique, recommend abdominal MRI for further assessment. There also 2 small subcentimeter enhancing foci in the periphery of the liver that are nonspecific. 3. Enhancing area of circumferential wall thickening in the proximal descending colon. Possible but not definite apple-core lesion. Neoplasm is considered. Recommend correlation with colonoscopy. 4. Small right inguinal hernia contains short segment of small bowel. No associated bowel obstruction or inflammation. Small fat containing umbilical and left inguinal hernias. 5. Tiny punctate right lower lobe pulmonary nodule. Nodule follow-up will be dependent on right upper quadrant lesion assessment. 6. Small 7 x 5 mm cyst in the pancreatic neck, nonspecific. 7. Small hiatal hernia.   Aortic Atherosclerosis (ICD10-I70.0).  MRI with and without contrast of the abdomen:   IMPRESSION: 1. There is a  heterogeneous, enhancing mass within the right upper quadrant of the abdomen which is intimately associated with the posterior right hepatic lobe and right adrenal gland. This is favored to be adrenal in origin. Differential considerations include metastatic disease versus primary adrenal neoplasm. Recommend referral to urology for further management. 2. Focal area of enhancement involving the descending colon is again noted. This is incompletely characterized. Further evaluation with colonoscopy is advised. 3. 8 mm cystic lesion within the neck of pancreas. According to consensus criteria follow-up imaging in 24 months with pancreas protocol MRI is recommended. 4. Bosniak class 1 left kidney cysts. No follow-up imaging recommended.    Past Medical/Surgical History: Past Medical History:  Diagnosis Date   Anal fissure 06/11/2009   Atrial fibrillation (HCC) 06/11/2009   HYPERTENSION 06/11/2009   NECK MASS 10/01/2010   Prostate cancer [facility])     Past Surgical History:  Procedure Laterality Date   CARPAL TUNNEL RELEASE     Both hands   CYST REMOVAL HAND     Left hand   CYSTOSCOPY  2000   hand   PROSTATE BIOPSY      Social History:  reports that he quit smoking about 31 years ago. His smoking use included cigars. He has never used smokeless tobacco. He reports that he does not drink alcohol and does not use drugs.  Allergies: No Known Allergies  Family History:  Family History  Problem Relation Age of Onset   Cancer Sister 12       unknown type   Cancer Brother        prostate/seed implant with [name]/died 04-07-14 of heart attack     Current Outpatient Medications:  amLODipine (NORVASC) 5 MG tablet, TAKE 1 TABLET DAILY, Disp: 90 tablet, Rfl: 3   Calcium Carb-Cholecalciferol (CALCIUM CARBONATE-VITAMIN D3 PO), Take by mouth., Disp: , Rfl:    clotrimazole-betamethasone (LOTRISONE) cream, APPLY TOPICALLY AS NEEDED, Disp: 45 g, Rfl: 7   latanoprost (XALATAN) 0.005 %  ophthalmic solution, INSTILL 1 DROP IN THE LEFT EYE AT BEDTIME (DISCARD 42 DAYS AFTER OPENING), Disp: 7.5 mL, Rfl: 3   warfarin (COUMADIN) 2.5 MG tablet, TAKE 1 1/2 TABLETS BY MOUTH DAILY EXCEPT TAKE 1 TABLET ON SUNDAYS, TUESDAYS AND THURSDAYS OR TAKE AS DIRECTED BY ANTICOAGULATION CLINIC, Disp: 155 tablet, Rfl: 3  Review of Systems:  Negative unless indicated in HPI.   Physical Exam: Vitals:   03/15/23 0857 03/15/23 0901  BP: (!) 150/76 (!) 144/65  Pulse: (!) 57   Temp: (!) 97.5 F (36.4 C)   TempSrc: Oral   SpO2: 100%   Weight: 154 lb 4.8 oz (70 kg)     Body mass index is 22.79 kg/m.   Physical Exam Vitals reviewed.  Constitutional:      Appearance: Normal appearance.  HENT:     Head: Normocephalic and atraumatic.  Eyes:     Conjunctiva/sclera: Conjunctivae normal.     Pupils: Pupils are equal, round, and reactive to light.  Skin:    General: Skin is warm and dry.  Neurological:     General: No focal deficit present.     Mental Status: He is alert and oriented to person, place, and time.  Psychiatric:        Mood and Affect: Mood normal.        Behavior: Behavior normal.        Thought Content: Thought content normal.        Judgment: Judgment normal.      Impression and Plan:  Hospital discharge follow-up  Unintentional weight loss -     Ambulatory referral to Gastroenterology  Colonic mass -     Ambulatory referral to Gastroenterology  -CT and MRI done in the emergency room on May 24 show colon mass, liver mass and what seems to be a right adrenal mass.  Looks like he has a malignancy of yet unknown primary.  I am suspicious that the colonic mass is primary.  He has a history of prostate cancer.  I feel like he needs to see GI for consideration of a colonoscopy.  His age makes it a little more tricky but he is in good health otherwise.  This would certainly explain his unintentional weight loss.  Urgent GI referral placed.   Time spent:31 minutes  reviewing chart, interviewing and examining patient and formulating plan of care.     Chaya Jan, MD [facility]

## 2023-03-16 ENCOUNTER — Encounter: Payer: Self-pay | Admitting: Gastroenterology

## 2023-03-16 ENCOUNTER — Telehealth: Payer: Self-pay

## 2023-03-16 NOTE — Transitions of Care (Post Inpatient/ED Visit) (Signed)
   03/16/2023  Name: Mark Wu MRN: [id number] DOB: 06-06-1926  Today's TOC FU Call Status: Today's TOC FU Call Status:: Unsuccessul Call (1st Attempt) Unsuccessful Call (1st Attempt) Date: 03/16/23  Red on EMMI-ED Discharge Alert Date & Reason:03/12/23 "Scheduled follow-up appt? No"   Attempted to reach the patient regarding the most recent Inpatient/ED visit.  Follow Up Plan: Additional outreach attempts will be made to reach the patient to complete the Transitions of Care (Post Inpatient/ED visit) call.     Antionette Fairy, RN,BSN,CCM [facility] Care Management Care Management Community Coordinator Direct Phone: [phone] Toll Free: [phone] Fax: [phone]

## 2023-03-16 NOTE — Telephone Encounter (Signed)
If 6/10 is the first we can get then that will do

## 2023-03-16 NOTE — Telephone Encounter (Signed)
Patient is scheduled for 03/24/23 @ 10:30am with Doug Sou, PA.  I called and left a detailed voicemail regarding patient's appointment.  Paperwork mailed

## 2023-03-16 NOTE — Telephone Encounter (Signed)
Mark Wu this pt can be scheduled on 6/10 when the 7 day hold opens up with any app.  See Dr Anselm Jungling note

## 2023-03-16 NOTE — Telephone Encounter (Signed)
Urgent APP appt within 1 week

## 2023-03-16 NOTE — Telephone Encounter (Signed)
Dr Russella Dar there are no appts available.  The only option is 6/10 and I can not schedule into that spot until 6/3 due to the 7 day hold.   Please advise

## 2023-03-17 ENCOUNTER — Telehealth: Payer: Self-pay

## 2023-03-17 NOTE — Transitions of Care (Post Inpatient/ED Visit) (Signed)
03/17/2023  Name: Mark Wu MRN: [id number] DOB: 09-Nov-1925  Today's TOC FU Call Status: Today's TOC FU Call Status:: Successful TOC FU Call Competed TOC FU Call Complete Date: 03/17/23  Red on EMMI-ED Discharge Alert Date & Reason:03/12/23 "Scheduled follow-up appt? No"  Transition Care Management Follow-up Telephone Call Date of Discharge: 03/10/23 [facility] Type of Discharge: Emergency Department Reason for ED Visit: Other: ("gen abd pain") How have you been since you were released from the hospital?: Better (Spoke w/ pt and wife-things going well-no further abd pain. He states he has a good appetite but continues to lose wgt.LBM was yest.) Any questions or concerns?: No  Items Reviewed: Did you receive and understand the discharge instructions provided?: Yes Medications obtained,verified, and reconciled?: Yes (Medications Reviewed) Any new allergies since your discharge?: No Dietary orders reviewed?: Yes Type of Diet Ordered:: low salt/heart healthy Do you have support at home?: Yes People in Home: spouse Name of Support/Comfort Primary Source: [name]  Medications Reviewed Today: Medications Reviewed Today     Reviewed by [name], RN (Registered Nurse) on 03/17/23 at [phone]  Med List Status: <None>   Medication Order Taking? Sig Documenting Provider Last Dose Status Informant  amLODipine (NORVASC) 5 MG tablet [id number] Yes TAKE 1 TABLET DAILY Burchette, Elberta Fortis, MD Taking Active   bisacodyl (DULCOLAX) 5 MG EC tablet [id number] Yes Take 5 mg by mouth daily as needed for moderate constipation. [provider] Taking Active Self  Calcium Carb-Cholecalciferol (CALCIUM CARBONATE-VITAMIN D3 PO) [id number] Yes Take by mouth. [provider] Taking Active Self  clotrimazole-betamethasone Thurmond Butts) cream [id number] Yes APPLY TOPICALLY AS NEEDED Burchette, Elberta Fortis, MD Taking Active   latanoprost (XALATAN) 0.005 %  ophthalmic solution [id number] Yes INSTILL 1 DROP IN THE LEFT EYE AT BEDTIME (DISCARD 42 DAYS AFTER OPENING) Rankin, Alford Highland, MD Taking Active   warfarin (COUMADIN) 2.5 MG tablet [id number] Yes TAKE 1 1/2 TABLETS BY MOUTH DAILY EXCEPT TAKE 1 TABLET ON SUNDAYS, TUESDAYS AND THURSDAYS OR TAKE AS DIRECTED BY ANTICOAGULATION CLINIC Burchette, Elberta Fortis, MD Taking Active             Home Care and Equipment/Supplies: Were Home Health Services Ordered?: NA Any new equipment or medical supplies ordered?: NA  Functional Questionnaire: Do you need assistance with bathing/showering or dressing?: No Do you need assistance with meal preparation?: No Do you need assistance with eating?: No Do you have difficulty maintaining continence: No Do you need assistance with getting out of bed/getting out of a chair/moving?: No Do you have difficulty managing or taking your medications?: No  Follow up appointments reviewed: PCP Follow-up appointment confirmed?: Yes Date of PCP follow-up appointment?: 03/15/23 Follow-up Provider: Dr. Philip Aspen Specialist [facility] Follow-up appointment confirmed?: Yes Date of Specialist follow-up appointment?: 03/24/23 Follow-Up Specialty Provider:: Jolyne Loa, pt will call Dr. Eskew(urology) for sooner f/u appt, Dr. Holli Humbles MD)-04/11/23 Do you need transportation to your follow-up appointment?: No (pt confirms his stepdaughter takes him to appts) Do you understand care options if your condition(s) worsen?: Yes-patient verbalized understanding  SDOH Interventions Today    Flowsheet Row Most Recent Value  SDOH Interventions   Food Insecurity Interventions Intervention Not Indicated  Transportation Interventions Intervention Not Indicated      TOC Interventions Today    Flowsheet Row Most Recent Value  TOC Interventions   TOC Interventions Discussed/Reviewed TOC Interventions Discussed      Interventions Today    Flowsheet Row Most Recent Value   Chronic Disease  Chronic disease during today's visit Other  [Prostate Cancer-with new mass to stomach/liver]  General Interventions   General Interventions Discussed/Reviewed General Interventions Discussed, Doctor Visits, Referral to Nurse  [follow up appt scheduled with assigned RN care coordinator]  Doctor Visits Discussed/Reviewed Doctor Visits Discussed, PCP, Specialist  PCP/Specialist Visits Compliance with follow-up visit  Education Interventions   Education Provided Provided Education  Provided Verbal Education On Nutrition, When to see the doctor, Other  Nutrition Interventions   Nutrition Discussed/Reviewed Nutrition Discussed  Pharmacy Interventions   Pharmacy Dicussed/Reviewed Pharmacy Topics Discussed, Medications and their functions  Safety Interventions   Safety Discussed/Reviewed Safety Discussed       [name] Care Management Care Management Community Coordinator Direct Phone: [phone] Toll Free: [phone] Fax: [phone]

## 2023-03-20 ENCOUNTER — Ambulatory Visit (INDEPENDENT_AMBULATORY_CARE_PROVIDER_SITE_OTHER): Payer: Medicare Other

## 2023-03-20 DIAGNOSIS — Z7901 Long term (current) use of anticoagulants: Secondary | ICD-10-CM | POA: Diagnosis not present

## 2023-03-20 LAB — POCT INR: INR: 4.2 — AB (ref 2.0–3.0)

## 2023-03-20 NOTE — Progress Notes (Addendum)
Pt was in ER on 2/24 for abdominal pain. Reports he had CT and they discovered a mass on his colon. He has apt with GI on 6/7 and let the coumadin clinic know if a colonoscopy is scheduled. Pt also reports he has lost about 10-15 lbs in the last 2 months. Pt denies any bleeding or abnormal bruising. Advised if any s/s to contact office of go to the ER. Pt verbalized understanding.   Hold warfarin today and then change weekly dose to take 1 tablet daily. Recheck in 2 weeks.

## 2023-03-20 NOTE — Patient Instructions (Addendum)
Pre visit review using our clinic review tool, if applicable. No additional management support is needed unless otherwise documented below in the visit note.  Hold warfarin today and then change weekly dose to take 1 tablet daily. Recheck in 2 weeks.

## 2023-03-24 ENCOUNTER — Telehealth: Payer: Self-pay

## 2023-03-24 ENCOUNTER — Ambulatory Visit (INDEPENDENT_AMBULATORY_CARE_PROVIDER_SITE_OTHER): Payer: Medicare Other | Admitting: Gastroenterology

## 2023-03-24 ENCOUNTER — Encounter: Payer: Self-pay | Admitting: Gastroenterology

## 2023-03-24 VITALS — BP 150/60 | HR 61 | Ht 69.0 in | Wt 151.0 lb

## 2023-03-24 DIAGNOSIS — Z7901 Long term (current) use of anticoagulants: Secondary | ICD-10-CM | POA: Diagnosis not present

## 2023-03-24 DIAGNOSIS — K6389 Other specified diseases of intestine: Secondary | ICD-10-CM

## 2023-03-24 DIAGNOSIS — R933 Abnormal findings on diagnostic imaging of other parts of digestive tract: Secondary | ICD-10-CM

## 2023-03-24 NOTE — Progress Notes (Signed)
03/24/2023 Mark Wu [id number] 08/08/1926   HISTORY OF PRESENT ILLNESS: This is a 89 year old male who is remotely known to Dr. Russella Dar.  He has atrial fibrillation with history of a mild stroke and is on Coumadin for that, otherwise has hypertension history of prostate cancer.  He went to the emergency department last month for some complaints of periumbilical abdominal pain.  He says by the time he even got seen in the emergency department the pain had pretty much resolved, nonetheless, he proceed with evaluation.  CT scan abdomen and pelvis with contrast 03/10/2023:  IMPRESSION: 1. Colonic diverticulosis with faint fat stranding adjacent to the proximal sigmoid, that may represent mild acute diverticulitis. No abscess or perforation. 2. Heterogeneous central right upper quadrant mass measuring 8.2 x 6.6 cm. This is intimately related to both the posterior right lobe of the liver as well as the right adrenal gland, exact etiology is difficult to delineate, however adrenal origin is favored. This is new from 2018 exam and suspicious for neoplasm/metastasis. If patient is able to tolerate breath hold technique, recommend abdominal MRI for further assessment. There also 2 small subcentimeter enhancing foci in the periphery of the liver that are nonspecific. 3. Enhancing area of circumferential wall thickening in the proximal descending colon. Possible but not definite apple-core lesion. Neoplasm is considered. Recommend correlation with colonoscopy. 4. Small right inguinal hernia contains short segment of small bowel. No associated bowel obstruction or inflammation. Small fat containing umbilical and left inguinal hernias. 5. Tiny punctate right lower lobe pulmonary nodule. Nodule follow-up will be dependent on right upper quadrant lesion assessment. 6. Small 7 x 5 mm cyst in the pancreatic neck, nonspecific. 7. Small hiatal hernia.   Aortic Atherosclerosis  (ICD10-I70.0).  MRI abdomen 03/10/2023:  IMPRESSION: 1. There is a heterogeneous, enhancing mass within the right upper quadrant of the abdomen which is intimately associated with the posterior right hepatic lobe and right adrenal gland. This is favored to be adrenal in origin. Differential considerations include metastatic disease versus primary adrenal neoplasm. Recommend referral to urology for further management. 2. Focal area of enhancement involving the descending colon is again noted. This is incompletely characterized. Further evaluation with colonoscopy is advised. 3. 8 mm cystic lesion within the neck of pancreas. According to consensus criteria follow-up imaging in 24 months with pancreas protocol MRI is recommended. 4. Bosniak class 1 left kidney cysts. No follow-up imaging recommended.  No significant issues with his bowel habits.  No rectal bleeding.  CBC and CMP looks good other than an elevated alk phos.  No further abdominal pain.    His wife and stepdaughter are present during the visit today.  Past Medical History:  Diagnosis Date   Anal fissure 06/11/2009   Atrial fibrillation (HCC) 06/11/2009   HYPERTENSION 06/11/2009   NECK MASS 10/01/2010   Prostate cancer (HCC)    Stroke (HCC) 08/09/2020   Past Surgical History:  Procedure Laterality Date   CARPAL TUNNEL RELEASE     Both hands   CYST REMOVAL HAND     Left hand   CYSTOSCOPY  2000   hand   PROSTATE BIOPSY      reports that he quit smoking about 31 years ago. His smoking use included cigars. He has never used smokeless tobacco. He reports that he does not drink alcohol and does not use drugs. family history includes Cancer in his brother; Cancer (age of onset: 8) in his sister. No Known Allergies    Outpatient Encounter  Medications as of 03/24/2023  Medication Sig   amLODipine (NORVASC) 5 MG tablet TAKE 1 TABLET DAILY   bisacodyl (DULCOLAX) 5 MG EC tablet Take 5 mg by mouth daily as needed for  moderate constipation.   Calcium Carb-Cholecalciferol (CALCIUM CARBONATE-VITAMIN D3 PO) Take by mouth.   clotrimazole-betamethasone (LOTRISONE) cream APPLY TOPICALLY AS NEEDED   latanoprost (XALATAN) 0.005 % ophthalmic solution INSTILL 1 DROP IN THE LEFT EYE AT BEDTIME (DISCARD 42 DAYS AFTER OPENING)   warfarin (COUMADIN) 2.5 MG tablet TAKE 1 1/2 TABLETS BY MOUTH DAILY EXCEPT TAKE 1 TABLET ON SUNDAYS, TUESDAYS AND THURSDAYS OR TAKE AS DIRECTED BY ANTICOAGULATION CLINIC   No facility-administered encounter medications on file as of 03/24/2023.     REVIEW OF SYSTEMS  : All other systems reviewed and negative except where noted in the History of Present Illness.   PHYSICAL EXAM: BP (!) 150/60   Pulse 61   Ht 5\' 9"  (1.753 m)   Wt 151 lb (68.5 kg)   SpO2 96%   BMI 22.30 kg/m  General: Well developed white male in no acute distress Head: Normocephalic and atraumatic Eyes:  Sclerae anicteric, conjunctiva pink. Ears: Normal auditory acuity Lungs: Clear throughout to auscultation; no W/R/R. Heart: Regular rate and rhythm; no M/R/G. Abdomen: Soft, non-distended.  BS present.  Non-tender.  Small reducible umbilical hernia noted.   Rectal:  Will be done at the time of colonoscopy. Musculoskeletal: Symmetrical with no gross deformities  Skin: No lesions on visible extremities Extremities: No edema  Neurological: Alert oriented x 4, grossly non-focal Psychological:  Alert and cooperative. Normal mood and affect  ASSESSMENT AND PLAN: *89 year old male with abnormal CT scan imaging of the colon showing circumferential wall thickening of the proximal descending colon, neoplasm is considered.  Last colonoscopy was close to 30 years ago.  He is interested in colonoscopy to evaluate and biopsy this.  He does have atrial fibrillation and is on Coumadin and amlodipine is his only other medication.  I think that he will tolerate colonoscopy.  Will schedule with Dr. Russella Dar. *Chronic anticoagulation with  Coumadin for history of atrial fibrillation:  Will hold Couamdin for 5 days prior to endoscopic procedures - will instruct when and how to resume after procedure. Benefits and risks of procedure explained including risks of bleeding, perforation, infection, missed lesions, reactions to medications and possible need for hospitalization and surgery for complications. Additional rare but real risk of stroke or other vascular clotting events off of Coumadin also explained and need to seek urgent help if any signs of these problems occur. Will communicate by phone or EMR with patient's prescribing provider, Dr. Caryl Never, to confirm that holding Coumadin is reasonable in this case.    CC:  Kristian Covey, MD

## 2023-03-24 NOTE — Telephone Encounter (Unsigned)
Pt was in coumadin clinic at the beginning of the week and reported he had apt with GI today and that he may have to have a procedure.  GI notes from today report pt is to have colonoscopy on 6/27 and will need to hold warfarin.   Pt hx related to warfarin, Afib and stroke (07/2020)

## 2023-03-24 NOTE — Patient Instructions (Signed)
You have been scheduled for a colonoscopy. Please follow written instructions given to you at your visit today.  Please pick up your prep supplies at the pharmacy within the next 1-3 days. If you use inhalers (even only as needed), please bring them with you on the day of your procedure.  _______________________________________________________  If your blood pressure at your visit was 140/90 or greater, please contact your primary care physician to follow up on this.  _______________________________________________________  If you are age 87 or older, your body mass index should be between 23-30. Your Body mass index is 22.3 kg/m. If this is out of the aforementioned range listed, please consider follow up with your Primary Care Provider.  If you are age 31 or younger, your body mass index should be between 19-25. Your Body mass index is 22.3 kg/m. If this is out of the aformentioned range listed, please consider follow up with your Primary Care Provider.   ________________________________________________________  The [facility] providers would like to encourage you to use [facility] to communicate with providers for non-urgent requests or questions.  Due to long hold times on the telephone, sending your provider a message by [facility] may be a faster and more efficient way to get a response.  Please allow 48 business hours for a response.  Please remember that this is for non-urgent requests.  _______________________________________________________

## 2023-03-27 ENCOUNTER — Ambulatory Visit: Payer: Self-pay

## 2023-03-27 NOTE — Patient Outreach (Signed)
  Care Coordination   Initial Visit Note   03/27/2023 Name: Mark Wu MRN: [id number] DOB: 1926-02-01  Mark Wu is a 89 y.o. year old male who sees Burchette, Elberta Fortis, MD for primary care. I spoke with  Staci Righter by phone today.  What matters to the patients health and wellness today?  Getting colonoscopy    Goals Addressed             This Visit's Progress    Getting colon evaluiated       Care Coordination Interventions: Evaluation of current treatment plan related to colon mass and patient's adherence to plan as established by provider Discussed plans with patient for ongoing care management follow up and provided patient with direct contact information for care management team Active listening / Reflection utilized  Spoke with patient and spouse.  Patient acknowledges he has a colon mass and waiting for colonoscopy for evaluation.  He states he feels good now and that he his using stool softener to keep his bowel regular.  Discussed eating high fiber foods to also help keep bowel regular.  He verbalized understanding.  No concerns at this time.           SDOH assessments and interventions completed:  Yes  SDOH Interventions Today    Flowsheet Row Most Recent Value  SDOH Interventions   Housing Interventions Intervention Not Indicated  Transportation Interventions Intervention Not Indicated        Care Coordination Interventions:  Yes, provided   Follow up plan: Follow up call scheduled for July    Encounter Outcome:  Pt. Visit Completed   Bary Leriche, RN, MSN [facility] Care Management Care Management Coordinator Direct Line [phone]

## 2023-03-27 NOTE — Telephone Encounter (Signed)
Received call from Donata Duff, pt's stepdaughter, who reports pt will have colonoscopy on 6/27 and was told by GI that warfarin would need to be held.  Advised pt will benefit from a lovenox bridge and a calendar will be created for dosing instructions. Clydie Braun will come to pt apt for education. Had to Rs coumadin clinic apt for 6/19 to assure Clydie Braun could make it to the apt.

## 2023-03-27 NOTE — Telephone Encounter (Addendum)
Wt: 68.5 kg CrCl: 47.92 mL/min  Lovenox; 100 mg, QD (9 syringes)  6/22: Take last dose of warfarin before surgery 6/23: NO warfarin, NO Lovenox 6/24: NO warfarin, inject Lovenox once in AM 6/25: NO warfarin, inject Lovenox once in AM 6/26: NO warfarin, inject Lovenox once in AM (before 7 AM)  6/27: Colonoscopy; NO WARFARIN, NO LOVENOX  6/28: Take 1 1/2 tablets (3.75 mg) warfarin, inject Lovenox once in AM 6/29: Take 1 1/2 tablets (3.75 mg) warfarin, inject Lovenox once in AM 6/30: Take 1 1/2 tablets (3.75 mg) warfarin, inject Lovenox once in AM 7/1: Take 1 1/2 tablets (3.75 mg) warfarin, inject Lovenox once in AM 7/2: Take 1 tablet (2.5 mg) warfarin, inject Lovenox once in AM 7/3: Take 1 tablet (2.5 mg) warfarin, inject Lovenox once in AM 7/4: Take 1 tablet (2.5 mg) warfarin, stop lovenox injections 7/5: Take 1 tablet (2.5 mg) warfarin 7/6: Take 1 tablet (2.5 mg) warfarin 7/7: Take 1 tablet (2.5 mg) warfarin 7/8: Recheck INR; hold warfarin until after INR check

## 2023-03-27 NOTE — Telephone Encounter (Signed)
Mark Covey, MD  You21 hours ago (2:00 PM)   I think the Lovenox would be appropriate.   Hopefully he can do the injections.   Lives with his wife but she has some mild cognitive impairment.

## 2023-03-27 NOTE — Patient Instructions (Signed)
Visit Information  Thank you for taking time to visit with me today. Please don't hesitate to contact me if I can be of assistance to you.   Following are the goals we discussed today:   Goals Addressed             This Visit's Progress    Getting colon evaluiated       Care Coordination Interventions: Evaluation of current treatment plan related to colon mass and patient's adherence to plan as established by provider Discussed plans with patient for ongoing care management follow up and provided patient with direct contact information for care management team Active listening / Reflection utilized  Spoke with patient and spouse.  Patient acknowledges he has a colon mass and waiting for colonoscopy for evaluation.  He states he feels good now and that he his using stool softener to keep his bowel regular.  Discussed eating high fiber foods to also help keep bowel regular.  He verbalized understanding.  No concerns at this time.           Our next appointment is by telephone on 04/18/23 at 1230 pm  Please call the care guide team at [phone] if you need to cancel or reschedule your appointment.   If you are experiencing a Mental Health or Behavioral Health Crisis or need someone to talk to, please call the Suicide and Crisis Lifeline: 988   Patient verbalizes understanding of instructions and care plan provided today and agrees to view in MyChart. Active MyChart status and patient understanding of how to access instructions and care plan via MyChart confirmed with patient.     The patient has been provided with contact information for the care management team and has been advised to call with any health related questions or concerns.   Bary Leriche, RN, MSN [facility] Care Management Care Management Coordinator Direct Line [phone]

## 2023-03-27 NOTE — Patient Instructions (Signed)
Pre visit review using our clinic review tool, if applicable. No additional management support is needed unless otherwise documented below in the visit note.  Lovenox; 100 mg, QD (9 syringes) Continue 1 tablet daily until you start the schedule below.  6/22: Take last dose of warfarin before surgery 6/23: NO warfarin, NO Lovenox 6/24: NO warfarin, inject Lovenox once in AM 6/25: NO warfarin, inject Lovenox once in AM 6/26: NO warfarin, inject Lovenox once in AM (before 7 AM)  6/27: Colonoscopy; NO WARFARIN, NO LOVENOX  6/28: Take 1 1/2 tablets (3.75 mg) warfarin, inject Lovenox once in AM 6/29: Take 1 1/2 tablets (3.75 mg) warfarin, inject Lovenox once in AM 6/30: Take 1 1/2 tablets (3.75 mg) warfarin, inject Lovenox once in AM 7/1: Take 1 1/2 tablets (3.75 mg) warfarin, inject Lovenox once in AM 7/2: Take 1 tablet (2.5 mg) warfarin, inject Lovenox once in AM 7/3: Take 1 tablet (2.5 mg) warfarin, inject Lovenox once in AM 7/4: Take 1 tablet (2.5 mg) warfarin, stop lovenox injections 7/5: Take 1 tablet (2.5 mg) warfarin 7/6: Take 1 tablet (2.5 mg) warfarin 7/7: Take 1 tablet (2.5 mg) warfarin 7/8: Recheck INR; hold warfarin until after INR check

## 2023-03-28 ENCOUNTER — Telehealth: Payer: Self-pay | Admitting: *Deleted

## 2023-03-28 NOTE — Telephone Encounter (Signed)
   Mark Wu 16-Sep-1926 [id number]  Dear Dr. Caryl Never:  We have scheduled the above named patient for a(n) colonoscopy procedure. Our records show that (s)he is on anticoagulation therapy.  Please advise as to whether the patient may come off their therapy of Coumadin 5 days prior to their procedure which is scheduled for 04/13/23.  Please route your response to Cristela Felt, CMA or fax response to [phone].  Sincerely,   Cristela Felt, [facility]

## 2023-03-28 NOTE — Telephone Encounter (Signed)
Pt has never seen [facility]. His PCP manages his warfarin. Recommend managing physician provide clearance.

## 2023-03-28 NOTE — Telephone Encounter (Signed)
   Patient Name: Mark Wu  DOB: 1926-03-29 MRN: [id number]  Primary Cardiologist: None  Chart reviewed as part of pre-operative protocol coverage. Pre-op clearance already addressed by colleagues in earlier phone notes. To summarize recommendations:  - Pt has never seen [facility]. His PCP manages his warfarin. Recommend managing physician provide clearance.    Will route this bundled recommendation to requesting provider via Epic fax function and remove from pre-op pool. Please call with questions.  Sharlene Dory, PA-C 03/28/2023, 12:49 PM

## 2023-03-28 NOTE — Telephone Encounter (Signed)
[facility] Pre-operative Risk Assessment     Request for surgical clearance:     Endoscopy Procedure  What type of surgery is being performed?     colonoscopy  When is this surgery scheduled?     04/13/23  What type of clearance is required ?   Pharmacy  Are there any medications that need to be held prior to surgery and how long? Coumadin 5 days  Practice name and name of physician performing surgery?       Gastroenterology  What is your office phone and fax number?      Phone- [phone]  Fax- [phone]  Anesthesia type (None, local, MAC, general) ?       MAC

## 2023-03-29 ENCOUNTER — Ambulatory Visit (INDEPENDENT_AMBULATORY_CARE_PROVIDER_SITE_OTHER): Payer: Medicare Other

## 2023-03-29 DIAGNOSIS — Z7901 Long term (current) use of anticoagulants: Secondary | ICD-10-CM

## 2023-03-29 LAB — POCT INR: INR: 2.7 (ref 2.0–3.0)

## 2023-03-29 MED ORDER — ENOXAPARIN SODIUM 100 MG/ML IJ SOSY
100.0000 mg | PREFILLED_SYRINGE | INTRAMUSCULAR | 0 refills | Status: DC
Start: 2023-03-29 — End: 2023-11-09

## 2023-03-29 NOTE — Progress Notes (Signed)
Continue 1 tablet daily until you start the schedule below.   Lovenox; 100 mg, QD (9 syringes)  6/22: Take last dose of warfarin before surgery 6/23: NO warfarin, NO Lovenox 6/24: NO warfarin, inject Lovenox once in AM 6/25: NO warfarin, inject Lovenox once in AM 6/26: NO warfarin, inject Lovenox once in AM (before 7 AM)  6/27: Colonoscopy; NO WARFARIN, NO LOVENOX  6/28: Take 1 1/2 tablets (3.75 mg) warfarin, inject Lovenox once in AM 6/29: Take 1 1/2 tablets (3.75 mg) warfarin, inject Lovenox once in AM 6/30: Take 1 1/2 tablets (3.75 mg) warfarin, inject Lovenox once in AM 7/1: Take 1 1/2 tablets (3.75 mg) warfarin, inject Lovenox once in AM 7/2: Take 1 tablet (2.5 mg) warfarin, inject Lovenox once in AM 7/3: Take 1 tablet (2.5 mg) warfarin, inject Lovenox once in AM 7/4: Take 1 tablet (2.5 mg) warfarin, stop lovenox injections 7/5: Take 1 tablet (2.5 mg) warfarin 7/6: Take 1 tablet (2.5 mg) warfarin 7/7: Take 1 tablet (2.5 mg) warfarin 7/8: Recheck INR; hold warfarin until after INR check  Sent in lovenox

## 2023-03-29 NOTE — Telephone Encounter (Signed)
Patient informed to hold Coumadin via Coumadin clinic.

## 2023-04-03 ENCOUNTER — Ambulatory Visit: Payer: Medicare Other

## 2023-04-05 ENCOUNTER — Ambulatory Visit: Payer: Medicare Other

## 2023-04-11 DIAGNOSIS — H3562 Retinal hemorrhage, left eye: Secondary | ICD-10-CM | POA: Diagnosis not present

## 2023-04-11 DIAGNOSIS — H401122 Primary open-angle glaucoma, left eye, moderate stage: Secondary | ICD-10-CM | POA: Diagnosis not present

## 2023-04-11 DIAGNOSIS — H353114 Nonexudative age-related macular degeneration, right eye, advanced atrophic with subfoveal involvement: Secondary | ICD-10-CM | POA: Diagnosis not present

## 2023-04-11 DIAGNOSIS — H353221 Exudative age-related macular degeneration, left eye, with active choroidal neovascularization: Secondary | ICD-10-CM | POA: Diagnosis not present

## 2023-04-11 DIAGNOSIS — H353212 Exudative age-related macular degeneration, right eye, with inactive choroidal neovascularization: Secondary | ICD-10-CM | POA: Diagnosis not present

## 2023-04-11 DIAGNOSIS — H353123 Nonexudative age-related macular degeneration, left eye, advanced atrophic without subfoveal involvement: Secondary | ICD-10-CM | POA: Diagnosis not present

## 2023-04-13 ENCOUNTER — Ambulatory Visit (AMBULATORY_SURGERY_CENTER): Payer: Medicare Other | Admitting: Gastroenterology

## 2023-04-13 ENCOUNTER — Encounter: Payer: Self-pay | Admitting: Gastroenterology

## 2023-04-13 VITALS — BP 149/88 | HR 75 | Temp 97.7°F | Resp 18 | Ht 69.0 in | Wt 151.0 lb

## 2023-04-13 DIAGNOSIS — R634 Abnormal weight loss: Secondary | ICD-10-CM

## 2023-04-13 DIAGNOSIS — K635 Polyp of colon: Secondary | ICD-10-CM | POA: Diagnosis not present

## 2023-04-13 DIAGNOSIS — D125 Benign neoplasm of sigmoid colon: Secondary | ICD-10-CM | POA: Diagnosis not present

## 2023-04-13 DIAGNOSIS — D124 Benign neoplasm of descending colon: Secondary | ICD-10-CM | POA: Diagnosis not present

## 2023-04-13 DIAGNOSIS — I4891 Unspecified atrial fibrillation: Secondary | ICD-10-CM | POA: Diagnosis not present

## 2023-04-13 DIAGNOSIS — R933 Abnormal findings on diagnostic imaging of other parts of digestive tract: Secondary | ICD-10-CM

## 2023-04-13 DIAGNOSIS — I10 Essential (primary) hypertension: Secondary | ICD-10-CM | POA: Diagnosis not present

## 2023-04-13 MED ORDER — SODIUM CHLORIDE 0.9 % IV SOLN
500.0000 mL | Freq: Once | INTRAVENOUS | Status: DC
Start: 2023-04-13 — End: 2023-04-13

## 2023-04-13 NOTE — Patient Instructions (Addendum)
Resume Coumadin tomorrow Resume Lovenox tomorrow No Aspirin,ibuprofen,Naproxen,and other Nsaids for 2 weeks Referral to urology for further evaluation of adrenal mass Resolution clip x2 was placed today- show the provided card if needing any imaging done   YOU HAD AN ENDOSCOPIC PROCEDURE TODAY: Refer to the procedure report and other information in the discharge instructions given to you for any specific questions about what was found during the examination. If this information does not answer your questions, please call [facility] office at [phone] to clarify.   YOU SHOULD EXPECT: Some feelings of bloating in the abdomen. Passage of more gas than usual. Walking can help get rid of the air that was put into your GI tract during the procedure and reduce the bloating. If you had a lower endoscopy (such as a colonoscopy or flexible sigmoidoscopy) you may notice spotting of blood in your stool or on the toilet paper. Some abdominal soreness may be present for a day or two, also.  DIET: Your first meal following the procedure should be a light meal and then it is ok to progress to your normal diet. A half-sandwich or bowl of soup is an example of a good first meal. Heavy or fried foods are harder to digest and may make you feel nauseous or bloated. Drink plenty of fluids but you should avoid alcoholic beverages for 24 hours. If you had a esophageal dilation, please see attached instructions for diet.    ACTIVITY: Your care partner should take you home directly after the procedure. You should plan to take it easy, moving slowly for the rest of the day. You can resume normal activity the day after the procedure however YOU SHOULD NOT DRIVE, use power tools, machinery or perform tasks that involve climbing or major physical exertion for 24 hours (because of the sedation medicines used during the test).   SYMPTOMS TO REPORT IMMEDIATELY: A gastroenterologist can be reached at any hour. Please call  [phone]  for any of the following symptoms:  Following lower endoscopy (colonoscopy, flexible sigmoidoscopy) Excessive amounts of blood in the stool  Significant tenderness, worsening of abdominal pains  Swelling of the abdomen that is new, acute  Fever of 100 or higher  Following upper endoscopy (EGD, EUS, ERCP, esophageal dilation) Vomiting of blood or coffee ground material  New, significant abdominal pain  New, significant chest pain or pain under the shoulder blades  Painful or persistently difficult swallowing  New shortness of breath  Black, tarry-looking or red, bloody stools  FOLLOW UP:  If any biopsies were taken you will be contacted by phone or by letter within the next 1-3 weeks. Call [phone]  if you have not heard about the biopsies in 3 weeks.  Please also call with any specific questions about appointments or follow up tests.

## 2023-04-13 NOTE — Progress Notes (Signed)
Called to room to assist during endoscopic procedure.  Patient ID and intended procedure confirmed with present staff. Received instructions for my participation in the procedure from the performing physician.  

## 2023-04-13 NOTE — Op Note (Signed)
[facility] Patient Name: Mark Wu Procedure Date: 04/13/2023 11:42 AM MRN: [id number] Endoscopist: Meryl Dare , MD, [phone] Age: 89 Referring MD:  Date of Birth: May 19, 1926 Gender: Male Account #: [id number] Procedure:                Colonoscopy Indications:              Abnormal CT of the GI tract (colon), Weight loss Medicines:                Monitored Anesthesia Care Procedure:                Pre-Anesthesia Assessment:                           - Prior to the procedure, a History and Physical                            was performed, and patient medications and                            allergies were reviewed. The patient's tolerance of                            previous anesthesia was also reviewed. The risks                            and benefits of the procedure and the sedation                            options and risks were discussed with the patient.                            All questions were answered, and informed consent                            was obtained. Prior Anticoagulants: The patient has                            taken Coumadin (warfarin), last dose was 5 days                            prior to procedure. Lovenox stopped 2 days prior to                            procedure. ASA Grade Assessment: III - A patient                            with severe systemic disease. After reviewing the                            risks and benefits, the patient was deemed in                            satisfactory condition to undergo the procedure.  After obtaining informed consent, the colonoscope                            was passed under direct vision. Throughout the                            procedure, the patient's blood pressure, pulse, and                            oxygen saturations were monitored continuously. The                            CF [id number] #[id number] was introduced through the anus                             and advanced to the the cecum, identified by                            appendiceal orifice and ileocecal valve. The                            ileocecal valve, appendiceal orifice, and rectum                            were photographed. The quality of the bowel                            preparation was adequate. The colonoscopy was                            performed without difficulty. The patient tolerated                            the procedure well. Scope In: 11:51:29 AM Scope Out: 12:14:34 PM Scope Withdrawal Time: 0 hours 0 minutes 9 seconds  Total Procedure Duration: 0 hours 23 minutes 5 seconds  Findings:                 The perianal and digital rectal examinations were                            normal.                           A 25 mm polyp was found in the descending colon at                            40 cm. The polyp was semi-pedunculated. The polyp                            was removed with a hot snare. Resection and                            retrieval were complete. To prevent bleeding after  the polypectomy, two hemostatic clips were                            successfully placed (MR conditional). Clip                            manufacturer: [employer]. There was no bleeding                            during, or at the end, of the procedure. Two areas                            ~ 7 cm distal to the polypectomy site were tattooed                            with injections of 2 mL of Spot (carbon black).                           A 7 mm polyp was found in the sigmoid colon. The                            polyp was sessile. The polyp was removed with a                            cold snare. Resection and retrieval were complete.                           Multiple medium-mouthed and small-mouthed                            diverticula were found in the left colon. There was                            no evidence of diverticular  bleeding.                           Internal hemorrhoids were found during                            retroflexion. The hemorrhoids were moderate and                            Grade I (internal hemorrhoids that do not prolapse).                           The exam was otherwise without abnormality on                            direct and retroflexion views. Complications:            No immediate complications. Estimated blood loss:                            None. Estimated Blood Loss:  Estimated blood loss: none. Impression:               - One 25 mm polyp in the descending colon, removed                            with a hot snare. Resected and retrieved. Clips (MR                            conditional) were placed. Clip manufacturer:                            [employer]. Tattooed.                           - One 7 mm polyp in the sigmoid colon, removed with                            a cold snare. Resected and retrieved.                           - Moderate diverticulosis in the left colon.                           - Internal hemorrhoids.                           - The examination was otherwise normal on direct                            and retroflexion views. Recommendation:           - Consider repeat colonoscopy vs no repeat due to                            age after studies are complete for surveillance                            based on pathology results.                           - Resume Coumadin (warfarin) tomorrow and Lovenox                            (enoxaparin) tomorrow at prior doses. Refer to                            managing physician for further adjustment of                            therapy.                           - Patient has a contact number available for                            emergencies. The signs and  symptoms of potential                            delayed complications were discussed with the                            patient. Return to  normal activities tomorrow.                            Written discharge instructions were provided to the                            patient.                           - Resume previous diet.                           - Continue present medications.                           - Await pathology results.                           - No aspirin, ibuprofen, naproxen, or other                            non-steroidal anti-inflammatory drugs for 2 weeks                            after polyp removal.                           - Referral to Urology for further evaluation of                            adenral mass. Meryl Dare, MD 04/13/2023 12:26:09 PM This report has been signed electronically.

## 2023-04-13 NOTE — Progress Notes (Signed)
See 03/24/2023 H&P, no changes 

## 2023-04-13 NOTE — Progress Notes (Signed)
Report to PACU, RN, vss, BBS= Clear.  

## 2023-04-14 ENCOUNTER — Telehealth: Payer: Self-pay | Admitting: *Deleted

## 2023-04-14 NOTE — Telephone Encounter (Signed)
  Follow up Call-     04/13/2023   10:58 AM  Call back number  Post procedure Call Back phone  # [phone]  [phone]  Permission to leave phone message Yes     Patient questions:   Message left to call us if necessary.

## 2023-04-17 ENCOUNTER — Telehealth: Payer: Self-pay

## 2023-04-17 NOTE — Telephone Encounter (Signed)
Pt had apt for INR check on 7/8, Monday, with coumadin clinic. Pt requested apt be moved to 7/10 when he has apt with PCP at 9:15. Advised coumadin clinic nurse is not at [facility] on Wednesday mornings. Pt has some transportation difficulities and would like to combine visits. Advised pt we would cancel his coumadin clinic apt on 7/8 and send a msg to PCP for INR check on 7/10 at his apt with PCP.   Advised when result is received by this nurse I will call him with any warfarin dosing instructions. Pt verbalized understanding and was appreciative.

## 2023-04-18 ENCOUNTER — Ambulatory Visit: Payer: Self-pay

## 2023-04-18 NOTE — Patient Instructions (Signed)
Visit Information  Thank you for taking time to visit with me today. Please don't hesitate to contact me if I can be of assistance to you.   Following are the goals we discussed today:   Goals Addressed             This Visit's Progress    Getting colon evaluated       Care Coordination Interventions: Evaluation of current treatment plan related to colon mass and patient's adherence to plan as established by provider Discussed plans with patient for ongoing care management follow up and provided patient with direct contact information for care management team Active listening / Reflection utilized  Spoke with patient and spouse.  Patient states he had an endoscopy and nothing was found there but he has a questionable mass close to his liver that he will discuss with Dr. Caryl Never for appropriate follow up.  He states he feels fine and that he has a stool softener to keep his bowel regular.  Discussed eating high fiber foods to also help keep bowel regular.  He verbalized understanding.  No concerns.           Our next appointment is by telephone on 05/22/23 at 1 pm  Please call the care guide team at [phone] if you need to cancel or reschedule your appointment.   If you are experiencing a Mental Health or Behavioral Health Crisis or need someone to talk to, please call the Suicide and Crisis Lifeline: 988   Patient verbalizes understanding of instructions and care plan provided today and agrees to view in MyChart. Active MyChart status and patient understanding of how to access instructions and care plan via MyChart confirmed with patient.     The patient has been provided with contact information for the care management team and has been advised to call with any health related questions or concerns.   Bary Leriche, RN, MSN [facility] Care Management Care Management Coordinator Direct Line [phone]

## 2023-04-18 NOTE — Patient Outreach (Signed)
  Care Coordination   Follow Up Visit Note   04/18/2023 Name: Alijah Glisan MRN: [id number] DOB: 04/14/1926  Random Tousant is a 89 y.o. year old male who sees Burchette, Elberta Fortis, MD for primary care. I spoke with  Staci Righter by phone today.  What matters to the patients health and wellness today?  Stomach mass follow up.    Goals Addressed             This Visit's Progress    Getting colon evaluated       Care Coordination Interventions: Evaluation of current treatment plan related to colon mass and patient's adherence to plan as established by provider Discussed plans with patient for ongoing care management follow up and provided patient with direct contact information for care management team Active listening / Reflection utilized  Spoke with patient and spouse.  Patient states he had an endoscopy and nothing was found there but he has a questionable mass close to his liver that he will discuss with Dr. Caryl Never for appropriate follow up.  He states he feels fine and that he has a stool softener to keep his bowel regular.  Discussed eating high fiber foods to also help keep bowel regular.  He verbalized understanding.  No concerns.           SDOH assessments and interventions completed:  Yes     Care Coordination Interventions:  Yes, provided   Follow up plan: Follow up call scheduled for August    Encounter Outcome:  Pt. Visit Completed {THN Tip this will not be part of the note when signed-REQUIRED REPORT FIELD DO NOT DELETE (Optional):[id number]  Bary Leriche, RN, MSN [facility] Care Management Care Management Coordinator Direct Line [phone]

## 2023-04-23 ENCOUNTER — Encounter: Payer: Self-pay | Admitting: Gastroenterology

## 2023-04-24 ENCOUNTER — Ambulatory Visit: Payer: Medicare Other

## 2023-04-26 ENCOUNTER — Ambulatory Visit: Payer: Self-pay

## 2023-04-26 ENCOUNTER — Telehealth: Payer: Self-pay | Admitting: Family Medicine

## 2023-04-26 ENCOUNTER — Encounter: Payer: Self-pay | Admitting: Family Medicine

## 2023-04-26 ENCOUNTER — Ambulatory Visit (INDEPENDENT_AMBULATORY_CARE_PROVIDER_SITE_OTHER): Payer: Medicare Other | Admitting: Family Medicine

## 2023-04-26 VITALS — BP 140/60 | HR 50 | Temp 97.4°F | Ht 69.0 in | Wt 146.1 lb

## 2023-04-26 DIAGNOSIS — R933 Abnormal findings on diagnostic imaging of other parts of digestive tract: Secondary | ICD-10-CM | POA: Diagnosis not present

## 2023-04-26 DIAGNOSIS — R634 Abnormal weight loss: Secondary | ICD-10-CM

## 2023-04-26 DIAGNOSIS — I4891 Unspecified atrial fibrillation: Secondary | ICD-10-CM | POA: Diagnosis not present

## 2023-04-26 DIAGNOSIS — Z7901 Long term (current) use of anticoagulants: Secondary | ICD-10-CM | POA: Diagnosis not present

## 2023-04-26 DIAGNOSIS — I10 Essential (primary) hypertension: Secondary | ICD-10-CM | POA: Diagnosis not present

## 2023-04-26 LAB — PROTIME-INR
INR: 1.7 ratio — ABNORMAL HIGH (ref 0.8–1.0)
Prothrombin Time: 17.5 s — ABNORMAL HIGH (ref 9.6–13.1)

## 2023-04-26 NOTE — Telephone Encounter (Signed)
Pt has been contacted and advised of warfarin dosing.

## 2023-04-26 NOTE — Progress Notes (Signed)
Established Patient Office Visit  Subjective   Patient ID: Mark Wu, male    DOB: 1925-12-22  Age: 89 y.o. MRN: [id number]  Chief Complaint  Patient presents with   Hospitalization Follow-up    HPI   Mark Wu is here today accompanied by daughter and wife.  He is very hard of hearing which makes communication difficult.  He has chronic problems including hypertension, atrial fibrillation, macular degeneration, history of GERD, prostate cancer.  He had recent CAT scan imaging back in May which showed question of proximal descending colon lesion.  Underwent colonoscopy end of June which showed only couple polyps but no mass concerning for cancer.  His x-rays also raised concern for right upper quadrant mass.  Subsequent MRI showed heterogenous enhancing mass right upper quadrant of the abdomen associated with the posterior right hepatic lobe and right adrenal gland.  Favored to be adrenal in origin.  Metastatic versus primary neoplasm.  He has pending appointment with urologist for further evaluation.  MRI also showed 8 mm cystic lesion neck of pancreas with consideration for 24 month follow-up MRI imaging  Has had some weight loss by family states his appetite is actually fairly good.  Currently not doing any nutritional supplement.  Does frequently complain of constipation.  Apparently drinks a lot of sweet tea daily.  Does eat a fair amount of fruit and vegetables.  Currently using Dulcolax occasionally  Past Medical History:  Diagnosis Date   Anal fissure 06/11/2009   Atrial fibrillation (HCC) 06/11/2009   HYPERTENSION 06/11/2009   NECK MASS 10/01/2010   Prostate cancer (HCC)    Stroke (HCC) 08/09/2020   Past Surgical History:  Procedure Laterality Date   CARPAL TUNNEL RELEASE     Both hands   CYST REMOVAL HAND     Left hand   CYSTOSCOPY  2000   hand   PROSTATE BIOPSY      reports that he quit smoking about 31 years ago. His smoking use included cigars. He has never used  smokeless tobacco. He reports that he does not drink alcohol and does not use drugs. family history includes Cancer in his brother; Cancer (age of onset: 55) in his sister. No Known Allergies  Review of Systems  Constitutional:  Negative for chills, fever and malaise/fatigue.  Eyes:  Negative for blurred vision.  Respiratory:  Negative for shortness of breath.   Cardiovascular:  Negative for chest pain.  Gastrointestinal:  Positive for constipation. Negative for abdominal pain and blood in stool.  Neurological:  Negative for dizziness, weakness and headaches.      Objective:     BP (!) 140/60 (BP Location: Left Arm, Patient Position: Sitting, Cuff Size: Normal)   Pulse (!) 50   Temp (!) 97.4 F (36.3 C) (Oral)   Ht 5\' 9"  (1.753 m)   Wt 146 lb 1.6 oz (66.3 kg)   SpO2 99%   BMI 21.58 kg/m  BP Readings from Last 3 Encounters:  04/26/23 (!) 140/60  04/13/23 (!) 149/88  03/24/23 (!) 150/60   Wt Readings from Last 3 Encounters:  04/26/23 146 lb 1.6 oz (66.3 kg)  04/13/23 151 lb (68.5 kg)  03/24/23 151 lb (68.5 kg)      Physical Exam Vitals reviewed.  Constitutional:      Appearance: Normal appearance.  Cardiovascular:     Comments: Bradycardic with rate around 50 which is apparently near his baseline. Pulmonary:     Effort: Pulmonary effort is normal.     Breath  sounds: Normal breath sounds.  Musculoskeletal:     Right lower leg: No edema.     Left lower leg: No edema.  Neurological:     Mental Status: He is alert.      No results found for any visits on 04/26/23.  Last CBC Lab Results  Component Value Date   WBC 6.9 03/10/2023   HGB 14.7 03/10/2023   HCT 42.7 03/10/2023   MCV 92.4 03/10/2023   MCH 31.8 03/10/2023   RDW 14.5 03/10/2023   PLT 253 03/10/2023   Last metabolic panel Lab Results  Component Value Date   GLUCOSE 104 (H) 03/10/2023   NA 136 03/10/2023   K 4.1 03/10/2023   CL 99 03/10/2023   CO2 25 03/10/2023   BUN 13 03/10/2023    CREATININE 0.92 03/10/2023   GFRNONAA >60 03/10/2023   CALCIUM 9.4 03/10/2023   PROT 7.6 03/10/2023   ALBUMIN 3.9 03/10/2023   BILITOT 0.7 03/10/2023   ALKPHOS 171 (H) 03/10/2023   AST 26 03/10/2023   ALT 17 03/10/2023   ANIONGAP 12 03/10/2023   Last hemoglobin A1c No results found for: "HGBA1C" Last thyroid functions Lab Results  Component Value Date   TSH 0.93 01/05/2022      The ASCVD Risk score (Arnett DK, et al., 2019) failed to calculate for the following reasons:   The 2019 ASCVD risk score is only valid for ages 36 to 58   The patient has a prior MI or stroke diagnosis    Assessment & Plan:   #1 chronic atrial fibrillation.  He is on chronic Coumadin therapy.  Rate stable at this time.  Check INR today.  #2 hypertension stable on amlodipine 5 mg daily.  Continue low-sodium diet  #3 recent question of colon mass on imaging.  Colonoscopy showed a couple polyps but no concerning mass  #4 question of right hepatic mass versus adrenal mass.  Pending follow-up with urologist.  #5 intermittent constipation.  Discussed importance of adequate fiber with least 25 to 30 g/day and adequate fluids.  Scale back caffeinated beverages.  Consider MiraLAX daily as needed  #6 weight loss.  By family's scales he is actually stabilized.  He has decent appetite.  Consider nutritional supplement with boost.  Recheck weight in 3 months  Evelena Peat, MD

## 2023-04-26 NOTE — Patient Instructions (Addendum)
Pre visit review using our clinic review tool, if applicable. No additional management support is needed unless otherwise documented below in the visit note.  Increase dose today to take 1 1/2 tablets and then continue 1 tablet daily. Recheck in 6 weeks.

## 2023-04-26 NOTE — Telephone Encounter (Signed)
Pt called inquiring how much Warfarin he should be taking today, please call and advise

## 2023-04-26 NOTE — Patient Instructions (Signed)
Consider nutritional supplement such as Boost daily.

## 2023-04-26 NOTE — Progress Notes (Signed)
Pt had PCP apt today and lab INR was drawn.  Pt has been on a lovenox bridge for surgery on 6/27. Increase dose today to take 1 1/2 tablets and then continue 1 tablet daily. Recheck in 6 weeks.  Contacted pt by phone and advised of dosing and recheck in 6 weeks. Pt read back instructions and we made next coumadin clinic apt while on the phone.

## 2023-05-05 ENCOUNTER — Telehealth: Payer: Self-pay | Admitting: *Deleted

## 2023-05-05 NOTE — Progress Notes (Signed)
Per pt and wife - cx care coordination phone appt with RN - declined to reschedule at this time and wanted to discontinue care coordination services.

## 2023-05-08 DIAGNOSIS — H6123 Impacted cerumen, bilateral: Secondary | ICD-10-CM | POA: Diagnosis not present

## 2023-05-17 ENCOUNTER — Encounter (INDEPENDENT_AMBULATORY_CARE_PROVIDER_SITE_OTHER): Payer: Self-pay

## 2023-05-23 DIAGNOSIS — H353114 Nonexudative age-related macular degeneration, right eye, advanced atrophic with subfoveal involvement: Secondary | ICD-10-CM | POA: Diagnosis not present

## 2023-05-23 DIAGNOSIS — H353212 Exudative age-related macular degeneration, right eye, with inactive choroidal neovascularization: Secondary | ICD-10-CM | POA: Diagnosis not present

## 2023-05-23 DIAGNOSIS — H353123 Nonexudative age-related macular degeneration, left eye, advanced atrophic without subfoveal involvement: Secondary | ICD-10-CM | POA: Diagnosis not present

## 2023-05-23 DIAGNOSIS — H401122 Primary open-angle glaucoma, left eye, moderate stage: Secondary | ICD-10-CM | POA: Diagnosis not present

## 2023-05-23 DIAGNOSIS — H353221 Exudative age-related macular degeneration, left eye, with active choroidal neovascularization: Secondary | ICD-10-CM | POA: Diagnosis not present

## 2023-05-23 DIAGNOSIS — H3562 Retinal hemorrhage, left eye: Secondary | ICD-10-CM | POA: Diagnosis not present

## 2023-06-05 ENCOUNTER — Ambulatory Visit (INDEPENDENT_AMBULATORY_CARE_PROVIDER_SITE_OTHER): Payer: Medicare Other

## 2023-06-05 DIAGNOSIS — Z7901 Long term (current) use of anticoagulants: Secondary | ICD-10-CM | POA: Diagnosis not present

## 2023-06-05 LAB — POCT INR: INR: 1.4 — AB (ref 2.0–3.0)

## 2023-06-05 NOTE — Patient Instructions (Addendum)
Pre visit review using our clinic review tool, if applicable. No additional management support is needed unless otherwise documented below in the visit note.  Increase dose today to take 1 1/2 tablets and increase dose tomorrow to take 1 1/2 tablets and then change weekly dose to take  1 tablet daily except take 1 1/2 tablets on Mondays and Thursdays. Recheck in 3 weeks

## 2023-06-05 NOTE — Progress Notes (Signed)
Increase dose today to take 1 1/2 tablets and increase dose tomorrow to take 1 1/2 tablets and then change weekly dose to take  1 tablet daily except take 1 1/2 tablets on Mondays and Thursdays. Recheck in 3 weeks per pt request.

## 2023-06-13 DIAGNOSIS — Z1330 Encounter for screening examination for mental health and behavioral disorders, unspecified: Secondary | ICD-10-CM | POA: Diagnosis not present

## 2023-06-13 DIAGNOSIS — C61 Malignant neoplasm of prostate: Secondary | ICD-10-CM | POA: Diagnosis not present

## 2023-06-26 ENCOUNTER — Ambulatory Visit (INDEPENDENT_AMBULATORY_CARE_PROVIDER_SITE_OTHER): Payer: Medicare Other

## 2023-06-26 DIAGNOSIS — Z7901 Long term (current) use of anticoagulants: Secondary | ICD-10-CM | POA: Diagnosis not present

## 2023-06-26 LAB — POCT INR: INR: 2.2 (ref 2.0–3.0)

## 2023-06-26 NOTE — Progress Notes (Signed)
Continue 1 tablet daily except take 1 1/2 tablets on Mondays and Thursdays. Recheck in 8 weeks per pt request due to transportation difficulties.

## 2023-06-26 NOTE — Patient Instructions (Addendum)
Pre visit review using our clinic review tool, if applicable. No additional management support is needed unless otherwise documented below in the visit note.  Continue 1 tablet daily except take 1 1/2 tablets on Mondays and Thursdays. Recheck in 8 weeks

## 2023-06-27 DIAGNOSIS — K4090 Unilateral inguinal hernia, without obstruction or gangrene, not specified as recurrent: Secondary | ICD-10-CM | POA: Diagnosis not present

## 2023-06-27 DIAGNOSIS — R1901 Right upper quadrant abdominal swelling, mass and lump: Secondary | ICD-10-CM | POA: Diagnosis not present

## 2023-06-27 DIAGNOSIS — C61 Malignant neoplasm of prostate: Secondary | ICD-10-CM | POA: Diagnosis not present

## 2023-07-05 ENCOUNTER — Other Ambulatory Visit: Payer: Self-pay | Admitting: Family Medicine

## 2023-07-05 DIAGNOSIS — Z7901 Long term (current) use of anticoagulants: Secondary | ICD-10-CM

## 2023-07-05 NOTE — Telephone Encounter (Signed)
Pt is compliant with warfarin management and PCP apts.  Sent in refill of warfarin to requested pharmacy.

## 2023-07-07 NOTE — Telephone Encounter (Signed)
Pt called to report he received a call from [facility] yesterday, 9/19, that the request for warfarin refill has not been responded to.   Advised pt script was sent in on 9/18 and their is electronic confirmation that they received it. Advised pt this nurse will contact pharmacy to verify they have the script. Advised if any further problems with receiving the script to contact the coumadin clinic. Pt verbalized understanding and was appreciative of the call.   Contacted [facility] and verified the script sent was for a 90 day supply. They will process the script today.

## 2023-07-11 DIAGNOSIS — E278 Other specified disorders of adrenal gland: Secondary | ICD-10-CM | POA: Diagnosis not present

## 2023-07-11 DIAGNOSIS — C61 Malignant neoplasm of prostate: Secondary | ICD-10-CM | POA: Diagnosis not present

## 2023-07-14 DIAGNOSIS — E278 Other specified disorders of adrenal gland: Secondary | ICD-10-CM | POA: Diagnosis not present

## 2023-07-18 DIAGNOSIS — H3562 Retinal hemorrhage, left eye: Secondary | ICD-10-CM | POA: Diagnosis not present

## 2023-07-18 DIAGNOSIS — H353212 Exudative age-related macular degeneration, right eye, with inactive choroidal neovascularization: Secondary | ICD-10-CM | POA: Diagnosis not present

## 2023-07-18 DIAGNOSIS — H353221 Exudative age-related macular degeneration, left eye, with active choroidal neovascularization: Secondary | ICD-10-CM | POA: Diagnosis not present

## 2023-07-18 DIAGNOSIS — H401122 Primary open-angle glaucoma, left eye, moderate stage: Secondary | ICD-10-CM | POA: Diagnosis not present

## 2023-08-10 DIAGNOSIS — C787 Secondary malignant neoplasm of liver and intrahepatic bile duct: Secondary | ICD-10-CM | POA: Diagnosis not present

## 2023-08-10 DIAGNOSIS — C61 Malignant neoplasm of prostate: Secondary | ICD-10-CM | POA: Diagnosis not present

## 2023-08-10 DIAGNOSIS — R978 Other abnormal tumor markers: Secondary | ICD-10-CM | POA: Diagnosis not present

## 2023-08-10 DIAGNOSIS — R1900 Intra-abdominal and pelvic swelling, mass and lump, unspecified site: Secondary | ICD-10-CM | POA: Diagnosis not present

## 2023-08-21 ENCOUNTER — Telehealth: Payer: Self-pay

## 2023-08-21 ENCOUNTER — Ambulatory Visit (INDEPENDENT_AMBULATORY_CARE_PROVIDER_SITE_OTHER): Payer: Medicare Other

## 2023-08-21 DIAGNOSIS — Z7901 Long term (current) use of anticoagulants: Secondary | ICD-10-CM | POA: Diagnosis not present

## 2023-08-21 LAB — POCT INR: INR: 2.3 (ref 2.0–3.0)

## 2023-08-21 NOTE — Telephone Encounter (Addendum)
Pt in coumadin clinic today with wife and her daughter, Clydie Braun. Clydie Braun reported pt is scheduled for a CT guided liver/adrenal gland biopsy for 11/20 and radiology is requesting INR at or below 1.5. they would like INR checked the day before the procedure.  Advised Clydie Braun this nurse will f/u with PCP and then contact her with instructions around the biopsy. Clydie Braun verbalized understanding.   Current warfarin dosing is 2.5 mg daily except take 3.75 mg on Monday and Thursday. Procedure will take place on a Wednesday, 11/20.   Pt is scheduled for INR check at [facility] on 11/19 at 9 am.   Warfarin dosing recommendation for pre and post-op CT guided biopsy of liver/adrenal gland.  Hold warfarin on 11/18 and 11/19 and restart warfarin, if advised by radiologist, restart warfarin at normal dosing on 11/20 the evening of the biopsy. Increase dose on 11/21, from 3.75 mg to 5 mg and increase dose on 11/22, from 2.5 mg to 3.75 mg. Recheck on 12/4.

## 2023-08-21 NOTE — Patient Instructions (Addendum)
Pre visit review using our clinic review tool, if applicable. No additional management support is needed unless otherwise documented below in the visit note.  Continue 1 tablet daily except take 1 1/2 tablets on Mondays and Thursdays. Recheck  on 11/19 at [facility] at [address]

## 2023-08-21 NOTE — Progress Notes (Signed)
Pt scheduled for CT guided biopsy or adrenal gland on 11/20 and radiology is requesting INR at 1.5 or below. Pt will need INR check the day before procedure.  Discussed with pt's step daughter, Clydie Braun, who accompanied pt to apt today, that this will be discussed with PCP and this nurse will f/u with instructions. Clydie Braun verbalized understanding.  Sent PCP msg to discuss warfarin dosing before and after biopsy.  Continue 1 tablet daily except take 1 1/2 tablets on Mondays and Thursdays. Recheck  on 11/19 at [facility] at [address]

## 2023-08-21 NOTE — Telephone Encounter (Signed)
Contacted Clydie Braun and advised of dosing. Scheduled next coumadin clinic apt. Clydie Braun wrote down instructions and read back. Advised if any changes to contact the coumadin clinic. Clydie Braun verbalized understanding.

## 2023-08-29 DIAGNOSIS — H401122 Primary open-angle glaucoma, left eye, moderate stage: Secondary | ICD-10-CM | POA: Diagnosis not present

## 2023-08-29 DIAGNOSIS — H353123 Nonexudative age-related macular degeneration, left eye, advanced atrophic without subfoveal involvement: Secondary | ICD-10-CM | POA: Diagnosis not present

## 2023-08-29 DIAGNOSIS — H3562 Retinal hemorrhage, left eye: Secondary | ICD-10-CM | POA: Diagnosis not present

## 2023-08-29 DIAGNOSIS — H353114 Nonexudative age-related macular degeneration, right eye, advanced atrophic with subfoveal involvement: Secondary | ICD-10-CM | POA: Diagnosis not present

## 2023-08-29 DIAGNOSIS — H353221 Exudative age-related macular degeneration, left eye, with active choroidal neovascularization: Secondary | ICD-10-CM | POA: Diagnosis not present

## 2023-08-29 DIAGNOSIS — H02036 Senile entropion of left eye, unspecified eyelid: Secondary | ICD-10-CM | POA: Diagnosis not present

## 2023-08-29 DIAGNOSIS — H353212 Exudative age-related macular degeneration, right eye, with inactive choroidal neovascularization: Secondary | ICD-10-CM | POA: Diagnosis not present

## 2023-09-04 ENCOUNTER — Ambulatory Visit: Payer: Medicare Other | Admitting: Family Medicine

## 2023-09-05 ENCOUNTER — Ambulatory Visit (INDEPENDENT_AMBULATORY_CARE_PROVIDER_SITE_OTHER): Payer: Medicare Other

## 2023-09-05 DIAGNOSIS — Z7901 Long term (current) use of anticoagulants: Secondary | ICD-10-CM

## 2023-09-05 LAB — POCT INR: INR: 1.7 — AB (ref 2.0–3.0)

## 2023-09-05 NOTE — Progress Notes (Signed)
Pt scheduled for CT guided biopsy or adrenal gland on 11/20 and radiology is requesting INR at 1.5 or below. Here this morning for INR check before biopsy. Warfarin dosing recommendation for pre and post-op CT guided biopsy of liver/adrenal gland.  Hold warfarin on 11/18 and 11/19 and restart warfarin, if advised by radiologist, restart warfarin at normal dosing on 11/20 the evening of the biopsy. Increase dose on 11/21, from 3.75 mg to 5 mg and increase dose on 11/22, from 2.5 mg to 3.75 mg. Recheck on 12/4.    INR today is 1.7, pt is holding warfarin today also so INR will be below 1.5 for tomorrow's biopsy.

## 2023-09-05 NOTE — Patient Instructions (Addendum)
Pre visit review using our clinic review tool, if applicable. No additional management support is needed unless otherwise documented below in the visit note.  Hold warfarin on 11/18 and 11/19 and restart warfarin, if advised by radiologist, restart warfarin at normal dosing on 11/20 the evening of the biopsy. Increase dose on 11/21, from 3.75 mg to 5 mg and increase dose on 11/22, from 2.5 mg to 3.75 mg. Recheck on 12/4.

## 2023-09-06 DIAGNOSIS — Z8546 Personal history of malignant neoplasm of prostate: Secondary | ICD-10-CM | POA: Diagnosis not present

## 2023-09-06 DIAGNOSIS — Z9889 Other specified postprocedural states: Secondary | ICD-10-CM | POA: Diagnosis not present

## 2023-09-06 DIAGNOSIS — R1909 Other intra-abdominal and pelvic swelling, mass and lump: Secondary | ICD-10-CM | POA: Diagnosis not present

## 2023-09-06 DIAGNOSIS — R1900 Intra-abdominal and pelvic swelling, mass and lump, unspecified site: Secondary | ICD-10-CM | POA: Diagnosis not present

## 2023-09-08 DIAGNOSIS — Z9889 Other specified postprocedural states: Secondary | ICD-10-CM | POA: Diagnosis not present

## 2023-09-08 DIAGNOSIS — R1900 Intra-abdominal and pelvic swelling, mass and lump, unspecified site: Secondary | ICD-10-CM | POA: Diagnosis not present

## 2023-09-08 DIAGNOSIS — R197 Diarrhea, unspecified: Secondary | ICD-10-CM | POA: Diagnosis not present

## 2023-09-08 DIAGNOSIS — Z8546 Personal history of malignant neoplasm of prostate: Secondary | ICD-10-CM | POA: Diagnosis not present

## 2023-09-20 ENCOUNTER — Ambulatory Visit (INDEPENDENT_AMBULATORY_CARE_PROVIDER_SITE_OTHER): Payer: Medicare Other

## 2023-09-20 DIAGNOSIS — Z7901 Long term (current) use of anticoagulants: Secondary | ICD-10-CM

## 2023-09-20 LAB — POCT INR: INR: 2.3 (ref 2.0–3.0)

## 2023-09-20 NOTE — Progress Notes (Signed)
Pt had CT guided adrenal gland biopsy on 11/20 and held two doses of warfarin before procedure per provider performing biopsy request.  Continue 1 tablet daily except take 1 1/2 tablets on Monday and Thursday. Recheck in 5 weeks.

## 2023-09-20 NOTE — Patient Instructions (Addendum)
Pre visit review using our clinic review tool, if applicable. No additional management support is needed unless otherwise documented below in the visit note.  Continue 1 tablet daily except take 1 1/2 tablets on Monday and Thursday. Recheck in 5 weeks.

## 2023-09-26 DIAGNOSIS — C61 Malignant neoplasm of prostate: Secondary | ICD-10-CM | POA: Diagnosis not present

## 2023-09-26 DIAGNOSIS — C220 Liver cell carcinoma: Secondary | ICD-10-CM | POA: Diagnosis not present

## 2023-10-06 DIAGNOSIS — J984 Other disorders of lung: Secondary | ICD-10-CM | POA: Diagnosis not present

## 2023-10-06 DIAGNOSIS — C220 Liver cell carcinoma: Secondary | ICD-10-CM | POA: Diagnosis not present

## 2023-10-06 DIAGNOSIS — J479 Bronchiectasis, uncomplicated: Secondary | ICD-10-CM | POA: Diagnosis not present

## 2023-10-06 DIAGNOSIS — R918 Other nonspecific abnormal finding of lung field: Secondary | ICD-10-CM | POA: Diagnosis not present

## 2023-10-06 DIAGNOSIS — I2510 Atherosclerotic heart disease of native coronary artery without angina pectoris: Secondary | ICD-10-CM | POA: Diagnosis not present

## 2023-10-06 DIAGNOSIS — I7121 Aneurysm of the ascending aorta, without rupture: Secondary | ICD-10-CM | POA: Diagnosis not present

## 2023-10-06 DIAGNOSIS — C61 Malignant neoplasm of prostate: Secondary | ICD-10-CM | POA: Diagnosis not present

## 2023-10-17 DIAGNOSIS — H353114 Nonexudative age-related macular degeneration, right eye, advanced atrophic with subfoveal involvement: Secondary | ICD-10-CM | POA: Diagnosis not present

## 2023-10-17 DIAGNOSIS — H353221 Exudative age-related macular degeneration, left eye, with active choroidal neovascularization: Secondary | ICD-10-CM | POA: Diagnosis not present

## 2023-10-17 DIAGNOSIS — H401122 Primary open-angle glaucoma, left eye, moderate stage: Secondary | ICD-10-CM | POA: Diagnosis not present

## 2023-10-17 DIAGNOSIS — H353123 Nonexudative age-related macular degeneration, left eye, advanced atrophic without subfoveal involvement: Secondary | ICD-10-CM | POA: Diagnosis not present

## 2023-10-17 DIAGNOSIS — H353212 Exudative age-related macular degeneration, right eye, with inactive choroidal neovascularization: Secondary | ICD-10-CM | POA: Diagnosis not present

## 2023-10-17 DIAGNOSIS — H3562 Retinal hemorrhage, left eye: Secondary | ICD-10-CM | POA: Diagnosis not present

## 2023-10-23 ENCOUNTER — Ambulatory Visit: Payer: Medicare Other

## 2023-10-24 DIAGNOSIS — C220 Liver cell carcinoma: Secondary | ICD-10-CM | POA: Diagnosis not present

## 2023-10-24 DIAGNOSIS — C61 Malignant neoplasm of prostate: Secondary | ICD-10-CM | POA: Diagnosis not present

## 2023-10-30 ENCOUNTER — Ambulatory Visit: Payer: Medicare Other

## 2023-10-31 DIAGNOSIS — C220 Liver cell carcinoma: Secondary | ICD-10-CM | POA: Diagnosis not present

## 2023-11-03 DIAGNOSIS — N281 Cyst of kidney, acquired: Secondary | ICD-10-CM | POA: Diagnosis not present

## 2023-11-03 DIAGNOSIS — I700 Atherosclerosis of aorta: Secondary | ICD-10-CM | POA: Diagnosis not present

## 2023-11-03 DIAGNOSIS — K449 Diaphragmatic hernia without obstruction or gangrene: Secondary | ICD-10-CM | POA: Diagnosis not present

## 2023-11-03 DIAGNOSIS — C220 Liver cell carcinoma: Secondary | ICD-10-CM | POA: Diagnosis not present

## 2023-11-06 ENCOUNTER — Ambulatory Visit (INDEPENDENT_AMBULATORY_CARE_PROVIDER_SITE_OTHER): Payer: Medicare Other

## 2023-11-06 DIAGNOSIS — Z7901 Long term (current) use of anticoagulants: Secondary | ICD-10-CM | POA: Diagnosis not present

## 2023-11-06 LAB — POCT INR: INR: 2.6 (ref 2.0–3.0)

## 2023-11-06 NOTE — Patient Instructions (Addendum)
 Pre visit review using our clinic review tool, if applicable. No additional management support is needed unless otherwise documented below in the visit note.  Continue 1 tablet daily except take 1 1/2 tablets on Monday and Thursday. Recheck in 5 weeks.

## 2023-11-06 NOTE — Progress Notes (Signed)
Pt has been diagnosed with hepatocellular carcinoma. He is discussing with oncology treatment options. He has recently had a CT to further assess and is waiting further information from oncology as to next steps for treatment. Continue 1 tablet daily except take 1 1/2 tablets on Monday and Thursday. Recheck in 5 weeks.

## 2023-11-09 ENCOUNTER — Telehealth: Payer: Self-pay

## 2023-11-09 DIAGNOSIS — Z7901 Long term (current) use of anticoagulants: Secondary | ICD-10-CM

## 2023-11-09 MED ORDER — ENOXAPARIN SODIUM 100 MG/ML IJ SOSY
100.0000 mg | PREFILLED_SYRINGE | INTRAMUSCULAR | 0 refills | Status: DC
Start: 2023-11-09 — End: 2023-12-07

## 2023-11-09 NOTE — Telephone Encounter (Signed)
Clydie Braun called to report pt will have procedure, hepatic arteriogram, on 1/30. Pt will need to be placed on a lovenox bridge.  Wt: 66.3 kg CrCl: 58036 mL/min  Pt meets protocol for once daily dosing of lovenox at 1.5 mg/kg; 100 mg in the AM. Current warfarin dosing is 1 tablet (2.5 mg) daily except take 1 1/2 tablets (3.75) on Monday and Thursday.  1/25: Take last dose of warfarin 1/26: NO warfarin, NO Lovenox 1/27: NO warfarin, inject Lovenox once in the AM 1/28: NO warfarin, inject Lovenox once in the AM 1/29: NO warfarin, inject Lovenox once in the AM (BEFORE 7 AM)  1/30: SURGERY; NO WARFARIN, NO LOVENOX  1/31: Take 1 1/2 tablets (3.75 mg) warfarin, inject Lovenox once in the AM 2/1: Take 1 1/2 tablets (3.75 mg) warfarin, inject Lovenox once in the AM 2/2: Take 1 1/2 tablets (3.75 mg) warfarin, inject Lovenox once in the AM 2/3: Take 2 tablets (5 mg) warfarin, inject Lovenox once in the AM 2/4: Take 1 tablet (2.5 mg) warfarin, inject Lovenox once in the AM 2/5: Recheck INR; NO WARFARIN AND NO LOVENOX UNTIL INR CHECK

## 2023-11-09 NOTE — Telephone Encounter (Signed)
Contacted pt's stepdaughter, Clydie Braun, and advised for need for lovenox bridge. She reports they advised to stop warfarin tomorrow and that INR will need to be less than 1.7.  Advised a lovenox bridge schedule has been created and pt will take last dose of warfarin on 1/25. Explained instructions to Clydie Braun who reports she remembers this from his prior surgery. She helps with the injections and warfarin dosing for the pt.  She would like Lovenox prescription sent to [facility]. Advised if any problem receiving the prescription to contact the coumadin clinic.  Advised a mychart msg will be sent with instructions and if any questions to contact the coumadin clinic. Clydie Braun verbalized understanding.   Sent mychart msg with instructions and script for Lovenox.

## 2023-11-16 DIAGNOSIS — C220 Liver cell carcinoma: Secondary | ICD-10-CM | POA: Diagnosis not present

## 2023-11-22 ENCOUNTER — Ambulatory Visit: Payer: Medicare Other

## 2023-11-22 DIAGNOSIS — Z7901 Long term (current) use of anticoagulants: Secondary | ICD-10-CM | POA: Diagnosis not present

## 2023-11-22 LAB — POCT INR: INR: 2 (ref 2.0–3.0)

## 2023-11-22 NOTE — Progress Notes (Signed)
 Pt had surgery one 1/30 and was placed on a lovenox  bridge. Stop lovenox  injections. Continue 1 tablet daily except take 1 1/2 tablets on Monday and Thursday. Recheck in 6 weeks.

## 2023-11-22 NOTE — Patient Instructions (Addendum)
 Pre visit review using our clinic review tool, if applicable. No additional management support is needed unless otherwise documented below in the visit note.  Continue 1 tablet daily except take 1 1/2 tablets on Monday and Thursday. Recheck in 6 weeks.

## 2023-12-05 DIAGNOSIS — H3562 Retinal hemorrhage, left eye: Secondary | ICD-10-CM | POA: Diagnosis not present

## 2023-12-05 DIAGNOSIS — H353114 Nonexudative age-related macular degeneration, right eye, advanced atrophic with subfoveal involvement: Secondary | ICD-10-CM | POA: Diagnosis not present

## 2023-12-05 DIAGNOSIS — H353221 Exudative age-related macular degeneration, left eye, with active choroidal neovascularization: Secondary | ICD-10-CM | POA: Diagnosis not present

## 2023-12-05 DIAGNOSIS — H353212 Exudative age-related macular degeneration, right eye, with inactive choroidal neovascularization: Secondary | ICD-10-CM | POA: Diagnosis not present

## 2023-12-05 DIAGNOSIS — H401122 Primary open-angle glaucoma, left eye, moderate stage: Secondary | ICD-10-CM | POA: Diagnosis not present

## 2023-12-05 DIAGNOSIS — H353123 Nonexudative age-related macular degeneration, left eye, advanced atrophic without subfoveal involvement: Secondary | ICD-10-CM | POA: Diagnosis not present

## 2023-12-06 ENCOUNTER — Telehealth: Payer: Self-pay

## 2023-12-06 DIAGNOSIS — Z7901 Long term (current) use of anticoagulants: Secondary | ICD-10-CM

## 2023-12-06 NOTE — Telephone Encounter (Signed)
Clydie Braun called to report pt will have procedure for radiation seed implant on 3/7 and oncology is requesting a 5 day warfarin hold and INR below 1.7.  Advised will discuss with PCP and f/u with her the beginning of next week. Clydie Braun verbalized understanding.  Current warfarin dosing is 1 tablet (2.5 mg) daily except take 1 1/2 tablets (3.75 mg) on Monday and Thursday. Lovenox bridge schedule;  Recommend 100 mg Lovenox syringe once in the AM  3/2: Take last dose of warfairn 3/3: NO warfarin, NO Lovenox 3/4: NO warfarin, inject Lovenox once in the AM 3/5: NO warfarin, inject Lovenox once in the AM 3/6: NO warfarin, inject Lovenox once in the AM (BEFORE 7 AM)  3/7: SURGERY; NO WARFARIN,  NO LOVENOX  3/8: Take 1 1/2 tablets (3.75 mg) warfarin, inject Lovenox once in the AM 3/9: Take 1 1/2 tablets (3.75 mg) warfarin, inject Lovenox once in the AM 3/10: Take 2 tablets (5 mg) warfarin, inject Lovenox once in the AM 3/11: Take 1 1/2 tablets (3.75 mg) warfarin, inject Lovenox once in the AM 3/12: Take 1 tablet (2.5 mg) warfarin, inject Lovenox once in the AM 3/13: Recheck INR; NO WARFARIN AND NO LOVENOX UNTIL AFTER INR CHECK

## 2023-12-07 MED ORDER — ENOXAPARIN SODIUM 100 MG/ML IJ SOSY: 100.0000 mg | PREFILLED_SYRINGE | INTRAMUSCULAR | 0 refills | Status: DC

## 2023-12-07 NOTE — Telephone Encounter (Signed)
Mark Wu returned call. Advised of dosing. She requests instructions be sent mychart. Changed INR recheck date to 3/12 due to that is when coumadin clinic is at [facility] location. [facility] coumadin clinic apt for 3/12.  Pt will only need 7 syringes due to this change. Advised if any changes to contact the coumadin clinic. Mark Wu verbalized understanding.   Sent mychart msg with instructions and sent in script for lovenox.

## 2023-12-07 NOTE — Telephone Encounter (Signed)
Wu, Mark Fortis, MD  You2 hours ago (11:46 AM)    OK to proceed with Lovenox bridge.    Lovenox syringes needed are 8, at 100 mg once daily.  Pt will need to be [facility] for coumadin clinic for 3/13. His apt currently is on 3/19.   LVM for Clydie Braun to return call concerning dosing.

## 2023-12-11 ENCOUNTER — Ambulatory Visit: Payer: Medicare Other

## 2023-12-22 DIAGNOSIS — Z8673 Personal history of transient ischemic attack (TIA), and cerebral infarction without residual deficits: Secondary | ICD-10-CM | POA: Diagnosis not present

## 2023-12-22 DIAGNOSIS — C220 Liver cell carcinoma: Secondary | ICD-10-CM | POA: Diagnosis not present

## 2023-12-22 DIAGNOSIS — Z8546 Personal history of malignant neoplasm of prostate: Secondary | ICD-10-CM | POA: Diagnosis not present

## 2023-12-22 DIAGNOSIS — Z7901 Long term (current) use of anticoagulants: Secondary | ICD-10-CM | POA: Diagnosis not present

## 2023-12-22 DIAGNOSIS — I4891 Unspecified atrial fibrillation: Secondary | ICD-10-CM | POA: Diagnosis not present

## 2023-12-22 DIAGNOSIS — Z87891 Personal history of nicotine dependence: Secondary | ICD-10-CM | POA: Diagnosis not present

## 2023-12-22 DIAGNOSIS — H9190 Unspecified hearing loss, unspecified ear: Secondary | ICD-10-CM | POA: Diagnosis not present

## 2023-12-22 DIAGNOSIS — Z79899 Other long term (current) drug therapy: Secondary | ICD-10-CM | POA: Diagnosis not present

## 2023-12-22 DIAGNOSIS — I10 Essential (primary) hypertension: Secondary | ICD-10-CM | POA: Diagnosis not present

## 2023-12-23 DIAGNOSIS — C220 Liver cell carcinoma: Secondary | ICD-10-CM | POA: Diagnosis not present

## 2023-12-23 DIAGNOSIS — Z7901 Long term (current) use of anticoagulants: Secondary | ICD-10-CM | POA: Diagnosis not present

## 2023-12-23 DIAGNOSIS — I4891 Unspecified atrial fibrillation: Secondary | ICD-10-CM | POA: Diagnosis not present

## 2023-12-23 DIAGNOSIS — H9190 Unspecified hearing loss, unspecified ear: Secondary | ICD-10-CM | POA: Diagnosis not present

## 2023-12-23 DIAGNOSIS — Z79899 Other long term (current) drug therapy: Secondary | ICD-10-CM | POA: Diagnosis not present

## 2023-12-23 DIAGNOSIS — I10 Essential (primary) hypertension: Secondary | ICD-10-CM | POA: Diagnosis not present

## 2023-12-26 ENCOUNTER — Other Ambulatory Visit: Payer: Self-pay | Admitting: Family Medicine

## 2023-12-26 DIAGNOSIS — Z7901 Long term (current) use of anticoagulants: Secondary | ICD-10-CM

## 2023-12-27 ENCOUNTER — Ambulatory Visit (INDEPENDENT_AMBULATORY_CARE_PROVIDER_SITE_OTHER): Payer: Medicare Other

## 2023-12-27 DIAGNOSIS — Z7901 Long term (current) use of anticoagulants: Secondary | ICD-10-CM

## 2023-12-27 LAB — POCT INR: INR: 1.4 — AB (ref 2.0–3.0)

## 2023-12-27 NOTE — Progress Notes (Signed)
 Pt had surgery on 3/7 and was placed on a Lovenox bridge. Review of chart reports pt was prescribed Avelox on 3/7 x 5 days; pt took last dose today. Coumadin clinic was not aware pt was prescribed this abx. This abx has a major interaction with warfarin. Pt also reports he was inpt until 3/9 and was given chemoembolization. He was advised at d/c to stop lovenox. Also advised to restart normal dose of warfarin.  Due to these factors and pt stable on current dose there is no change to weekly dose, only addition of two conservative booster doses. Will recheck in 2 weeks.  Increase dose today to take 1/12 tablets and increase dose tomorrow to take 2 tablets and then continue 1 tablet daily except take 1 1/2 tablets on Monday and Thursday. Recheck in 2 weeks.

## 2023-12-27 NOTE — Patient Instructions (Addendum)
 Pre visit review using our clinic review tool, if applicable. No additional management support is needed unless otherwise documented below in the visit note.  Increase dose today to take 1/12 tablets and increase dose tomorrow to take 2 tablets and then continue 1 tablet daily except take 1 1/2 tablets on Monday and Thursday. Recheck in 2 weeks.

## 2023-12-27 NOTE — Telephone Encounter (Signed)
 Pt is compliant with warfarin management and PCP apts.  Sent in refill of warfarin to requested pharmacy.

## 2024-01-02 DIAGNOSIS — H6123 Impacted cerumen, bilateral: Secondary | ICD-10-CM | POA: Diagnosis not present

## 2024-01-03 ENCOUNTER — Ambulatory Visit: Payer: Medicare Other

## 2024-01-09 DIAGNOSIS — R351 Nocturia: Secondary | ICD-10-CM | POA: Diagnosis not present

## 2024-01-09 DIAGNOSIS — E278 Other specified disorders of adrenal gland: Secondary | ICD-10-CM | POA: Diagnosis not present

## 2024-01-09 DIAGNOSIS — C61 Malignant neoplasm of prostate: Secondary | ICD-10-CM | POA: Diagnosis not present

## 2024-01-10 ENCOUNTER — Ambulatory Visit (INDEPENDENT_AMBULATORY_CARE_PROVIDER_SITE_OTHER)

## 2024-01-10 DIAGNOSIS — Z7901 Long term (current) use of anticoagulants: Secondary | ICD-10-CM

## 2024-01-10 LAB — POCT INR: INR: 4.6 — AB (ref 2.0–3.0)

## 2024-01-10 NOTE — Progress Notes (Signed)
 Pt denies any changes except having cancer treatment with IR this week. This appears to be radiation therapy. Hold dose today and hold dose tomorrow and then change weekly dose to take 1 tablet daily. Recheck in 2 weeks.  Pt denies any s/s of abnormal bruising or bleeding. Advised pt and his step-daughter if any s/s to go to ER or call 911. Both verbalized understanding.

## 2024-01-10 NOTE — Patient Instructions (Addendum)
Pre visit review using our clinic review tool, if applicable. No additional management support is needed unless otherwise documented below in the visit note.  Hold dose today and hold dose tomorrow and then change weekly dose to take 1 tablet daily. Recheck in 2 weeks. 

## 2024-01-16 DIAGNOSIS — C220 Liver cell carcinoma: Secondary | ICD-10-CM | POA: Diagnosis not present

## 2024-01-22 ENCOUNTER — Telehealth: Payer: Self-pay

## 2024-01-22 ENCOUNTER — Ambulatory Visit (INDEPENDENT_AMBULATORY_CARE_PROVIDER_SITE_OTHER)

## 2024-01-22 DIAGNOSIS — C229 Malignant neoplasm of liver, not specified as primary or secondary: Secondary | ICD-10-CM

## 2024-01-22 DIAGNOSIS — Z7901 Long term (current) use of anticoagulants: Secondary | ICD-10-CM | POA: Diagnosis not present

## 2024-01-22 DIAGNOSIS — C220 Liver cell carcinoma: Secondary | ICD-10-CM

## 2024-01-22 LAB — POCT INR: INR: 2 (ref 2.0–3.0)

## 2024-01-22 NOTE — Patient Instructions (Addendum)
 Pre visit review using our clinic review tool, if applicable. No additional management support is needed unless otherwise documented below in the visit note.  Continue 1 tablet daily.  Re-check in 6 weeks.

## 2024-01-22 NOTE — Telephone Encounter (Signed)
 Pt and step-daughter, Clydie Braun,  were in office today for coumadin clinic apt. Pt's step-daughter, Clydie Braun, requested to have pt have labs at PCP office ordered by Dr. Kathi Simpers, oncologist with [facility] Dr. Henderson Cloud advised the family the pt could have labs at PCP office or [facility] if that was easier for the pt. Daughter reports it would be more difficult to go to [place] because they would have to go to [place]. They would prefer for pt to have labs at PCP office, if PCP agrees. Advised a msg would be sent to PCP with their request. Made a copy of requested oncology labs; AFP Tumor Marker, CMP, and CBC. Pt will need these completed one week before his next apt with oncology which is on June 10th, 2025. Advised the office would f/u with them concerning the labs. Pt's step-daughter was appreciative of the help.  Gave copy of lab orders to CMA, Mykal Good.

## 2024-01-22 NOTE — Progress Notes (Signed)
 Pt denies any changes except having one additional cancer treatment with IR since last INR check. This appears to be radiation therapy. Continue 1 tablet daily. Recheck in 6 weeks.  Pt's step-daughter, Clydie Braun, requested to have pt have labs at PCP office for Dr. Kathi Simpers, oncologist with [facility]. Dr. Henderson Cloud advised the family the pt could have labs at PCP office or [facility] if that was easier for the pt. Daughter reports it would be more difficult to go to [place] because they would have to go to [place]. They would prefer for pt to have labs at PCP office, if PCP agrees. Advised a msg would be sent to PCP with their request. Made a copy of requested oncology labs; AFP Tumor Marker, CMP, and CBC. Pt will need these completed one week before his next apt with oncology which is on June 10th, 2025. Advised the office would f/u with them concerning the labs. Pt's step-daughter was appreciative of the help. Sent msg to PCP with request and gave copy of requested lab orders to CMA.

## 2024-01-23 DIAGNOSIS — H353114 Nonexudative age-related macular degeneration, right eye, advanced atrophic with subfoveal involvement: Secondary | ICD-10-CM | POA: Diagnosis not present

## 2024-01-23 DIAGNOSIS — H3562 Retinal hemorrhage, left eye: Secondary | ICD-10-CM | POA: Diagnosis not present

## 2024-01-23 DIAGNOSIS — H353123 Nonexudative age-related macular degeneration, left eye, advanced atrophic without subfoveal involvement: Secondary | ICD-10-CM | POA: Diagnosis not present

## 2024-01-23 DIAGNOSIS — H353212 Exudative age-related macular degeneration, right eye, with inactive choroidal neovascularization: Secondary | ICD-10-CM | POA: Diagnosis not present

## 2024-01-23 DIAGNOSIS — H401122 Primary open-angle glaucoma, left eye, moderate stage: Secondary | ICD-10-CM | POA: Diagnosis not present

## 2024-01-23 DIAGNOSIS — H353221 Exudative age-related macular degeneration, left eye, with active choroidal neovascularization: Secondary | ICD-10-CM | POA: Diagnosis not present

## 2024-01-24 NOTE — Telephone Encounter (Signed)
 Labs placed and left detailed message on home vm informing patient of this.

## 2024-01-31 DIAGNOSIS — C220 Liver cell carcinoma: Secondary | ICD-10-CM | POA: Diagnosis not present

## 2024-02-28 ENCOUNTER — Other Ambulatory Visit: Payer: Self-pay | Admitting: Family Medicine

## 2024-03-04 ENCOUNTER — Ambulatory Visit

## 2024-03-04 DIAGNOSIS — Z7901 Long term (current) use of anticoagulants: Secondary | ICD-10-CM | POA: Diagnosis not present

## 2024-03-04 LAB — POCT INR: INR: 1.8 — AB (ref 2.0–3.0)

## 2024-03-04 NOTE — Progress Notes (Signed)
 Pt denies any changes. Increase dose today to take 1 1/2 tablets and then continue 1 tablet daily. Recheck in 4 weeks.

## 2024-03-04 NOTE — Patient Instructions (Addendum)
 Pre visit review using our clinic review tool, if applicable. No additional management support is needed unless otherwise documented below in the visit note.  Increase dose today to take 1 1/2 tablets and then continue 1 tablet daily. Recheck in 4 weeks.

## 2024-03-12 DIAGNOSIS — H02036 Senile entropion of left eye, unspecified eyelid: Secondary | ICD-10-CM | POA: Diagnosis not present

## 2024-03-12 DIAGNOSIS — H353212 Exudative age-related macular degeneration, right eye, with inactive choroidal neovascularization: Secondary | ICD-10-CM | POA: Diagnosis not present

## 2024-03-12 DIAGNOSIS — H3562 Retinal hemorrhage, left eye: Secondary | ICD-10-CM | POA: Diagnosis not present

## 2024-03-12 DIAGNOSIS — H4720 Unspecified optic atrophy: Secondary | ICD-10-CM | POA: Diagnosis not present

## 2024-03-12 DIAGNOSIS — H353123 Nonexudative age-related macular degeneration, left eye, advanced atrophic without subfoveal involvement: Secondary | ICD-10-CM | POA: Diagnosis not present

## 2024-03-12 DIAGNOSIS — H401122 Primary open-angle glaucoma, left eye, moderate stage: Secondary | ICD-10-CM | POA: Diagnosis not present

## 2024-03-12 DIAGNOSIS — H353221 Exudative age-related macular degeneration, left eye, with active choroidal neovascularization: Secondary | ICD-10-CM | POA: Diagnosis not present

## 2024-03-12 DIAGNOSIS — H353114 Nonexudative age-related macular degeneration, right eye, advanced atrophic with subfoveal involvement: Secondary | ICD-10-CM | POA: Diagnosis not present

## 2024-03-18 ENCOUNTER — Other Ambulatory Visit

## 2024-03-18 DIAGNOSIS — C220 Liver cell carcinoma: Secondary | ICD-10-CM

## 2024-03-18 LAB — CBC WITH DIFFERENTIAL/PLATELET
Basophils Absolute: 0 10*3/uL (ref 0.0–0.1)
Basophils Relative: 0.3 % (ref 0.0–3.0)
Eosinophils Absolute: 0.1 10*3/uL (ref 0.0–0.7)
Eosinophils Relative: 2.3 % (ref 0.0–5.0)
HCT: 40.8 % (ref 39.0–52.0)
Hemoglobin: 14.4 g/dL (ref 13.0–17.0)
Lymphocytes Relative: 28.4 % (ref 12.0–46.0)
Lymphs Abs: 1.6 10*3/uL (ref 0.7–4.0)
MCHC: 35.3 g/dL (ref 30.0–36.0)
MCV: 94.3 fl (ref 78.0–100.0)
Monocytes Absolute: 0.7 10*3/uL (ref 0.1–1.0)
Monocytes Relative: 13.2 % — ABNORMAL HIGH (ref 3.0–12.0)
Neutro Abs: 3.1 10*3/uL (ref 1.4–7.7)
Neutrophils Relative %: 55.8 % (ref 43.0–77.0)
Platelets: 281 10*3/uL (ref 150.0–400.0)
RBC: 4.33 Mil/uL (ref 4.22–5.81)
RDW: 15.7 % — ABNORMAL HIGH (ref 11.5–15.5)
WBC: 5.6 10*3/uL (ref 4.0–10.5)

## 2024-03-18 LAB — COMPREHENSIVE METABOLIC PANEL WITH GFR
ALT: 17 U/L (ref 0–53)
AST: 27 U/L (ref 0–37)
Albumin: 4.1 g/dL (ref 3.5–5.2)
Alkaline Phosphatase: 66 U/L (ref 39–117)
BUN: 18 mg/dL (ref 6–23)
CO2: 28 meq/L (ref 19–32)
Calcium: 9.9 mg/dL (ref 8.4–10.5)
Chloride: 101 meq/L (ref 96–112)
Creatinine, Ser: 0.94 mg/dL (ref 0.40–1.50)
GFR: 68.86 mL/min (ref >60.00)
Glucose, Bld: 73 mg/dL (ref 70–99)
Potassium: 4.4 meq/L (ref 3.5–5.1)
Sodium: 137 meq/L (ref 135–145)
Total Bilirubin: 0.6 mg/dL (ref 0.2–1.2)
Total Protein: 6.9 g/dL (ref 6.0–8.3)

## 2024-03-19 LAB — AFP TUMOR MARKER: AFP-Tumor Marker: 117098.8 ng/mL — ABNORMAL HIGH (ref <6.1)

## 2024-03-26 DIAGNOSIS — C220 Liver cell carcinoma: Secondary | ICD-10-CM | POA: Diagnosis not present

## 2024-03-26 DIAGNOSIS — K4020 Bilateral inguinal hernia, without obstruction or gangrene, not specified as recurrent: Secondary | ICD-10-CM | POA: Diagnosis not present

## 2024-03-26 DIAGNOSIS — N400 Enlarged prostate without lower urinary tract symptoms: Secondary | ICD-10-CM | POA: Diagnosis not present

## 2024-03-27 DIAGNOSIS — Z79899 Other long term (current) drug therapy: Secondary | ICD-10-CM | POA: Diagnosis not present

## 2024-03-27 DIAGNOSIS — C220 Liver cell carcinoma: Secondary | ICD-10-CM | POA: Diagnosis not present

## 2024-04-01 ENCOUNTER — Ambulatory Visit (INDEPENDENT_AMBULATORY_CARE_PROVIDER_SITE_OTHER)

## 2024-04-01 DIAGNOSIS — Z7901 Long term (current) use of anticoagulants: Secondary | ICD-10-CM

## 2024-04-01 LAB — POCT INR: INR: 1.2 — AB (ref 2.0–3.0)

## 2024-04-01 NOTE — Progress Notes (Signed)
 Pt reports he has had a new treatment for liver cancer but does not know the name of the medication. Unable to find this information under care everywhere tab either. Pt denies missing any doses of warfarin. This new treatment could be causing the subtherapeutic INR.  Increase dose today and tomorrow to take 1 1/2 tablets and then change weekly dose to take 1 tablet daily except take 1 1/2 tablets on Monday, Wednesday and Friday. Recheck in 2 weeks.

## 2024-04-01 NOTE — Patient Instructions (Addendum)
 Pre visit review using our clinic review tool, if applicable. No additional management support is needed unless otherwise documented below in the visit note.  Increase dose today and tomorrow to take 1 1/2 tablets and then change weekly dose to take 1 tablet daily except take 1 1/2 tablets on Monday, Wednesday and Friday. Recheck in 2 weeks.

## 2024-04-03 DIAGNOSIS — C220 Liver cell carcinoma: Secondary | ICD-10-CM | POA: Diagnosis not present

## 2024-04-03 DIAGNOSIS — J9809 Other diseases of bronchus, not elsewhere classified: Secondary | ICD-10-CM | POA: Diagnosis not present

## 2024-04-03 DIAGNOSIS — Z79899 Other long term (current) drug therapy: Secondary | ICD-10-CM | POA: Diagnosis not present

## 2024-04-03 DIAGNOSIS — Z2989 Encounter for other specified prophylactic measures: Secondary | ICD-10-CM | POA: Diagnosis not present

## 2024-04-03 DIAGNOSIS — H9190 Unspecified hearing loss, unspecified ear: Secondary | ICD-10-CM | POA: Diagnosis not present

## 2024-04-03 DIAGNOSIS — J90 Pleural effusion, not elsewhere classified: Secondary | ICD-10-CM | POA: Diagnosis not present

## 2024-04-03 DIAGNOSIS — C787 Secondary malignant neoplasm of liver and intrahepatic bile duct: Secondary | ICD-10-CM | POA: Diagnosis not present

## 2024-04-03 DIAGNOSIS — Z5112 Encounter for antineoplastic immunotherapy: Secondary | ICD-10-CM | POA: Diagnosis not present

## 2024-04-03 DIAGNOSIS — C61 Malignant neoplasm of prostate: Secondary | ICD-10-CM | POA: Diagnosis not present

## 2024-04-03 DIAGNOSIS — R918 Other nonspecific abnormal finding of lung field: Secondary | ICD-10-CM | POA: Diagnosis not present

## 2024-04-08 ENCOUNTER — Telehealth: Payer: Self-pay

## 2024-04-08 NOTE — Telephone Encounter (Signed)
 Pt's stepdaughter, Darice, called to report pt had apt with [facility] oncology last week. She reports pt was started on immunosuppressants (could not produce the name) on 6/18 and also had a CT. Images revealed pt has pneumonia. They started pt on abx, azithromycin x 5 days and Augmentin x 7 days on 6/20.  She reports oncology advised pt to stop warfarin on 6/20, when he started the abxs and hold warfarin until he finished the abxs and then when finished have his INR checked.   Advised Darice pt was very subtherapeutic last week, with INR of 1.2, with no known cause. She reports oncology did not check INR last week, they just advised him to stop warfarin until finished with abxs.   There is a potential interaction with azithromycin which could increase INR but no interaction with Augmentin. Unsure why pt was advised to stop warfarin while taking these abx and to f/u when finished to check INR.   Pt is now at increased risk of a clot due to holding warfarin for the last 3 days. Myles Darice to restart prior warfarin dosing and will recheck INR on 6/25. Made apt for pt to coumadin  clinic. Advised to watch for s/s of a clot and also s/s of abnormal bruising or bleeding. Advised if any s/s to take pt to ER or call 911. Darice verbalized understanding.

## 2024-04-10 ENCOUNTER — Ambulatory Visit (INDEPENDENT_AMBULATORY_CARE_PROVIDER_SITE_OTHER)

## 2024-04-10 DIAGNOSIS — Z7901 Long term (current) use of anticoagulants: Secondary | ICD-10-CM | POA: Diagnosis not present

## 2024-04-10 LAB — POCT INR: INR: 1.5 — AB (ref 2.0–3.0)

## 2024-04-10 NOTE — Progress Notes (Signed)
 Pt recently diagnosed with pneumonia and was prescribed azithromycin x 5 days and Augmentin x 7 days on 6/23. Interaction with azithromycin but not Augmentin. Pt was also advised by oncology to stop taking warfarin while he was taking abxs. So, pt missed 3 doses of warfarin. Advised pt's stepdaughter, when she called on 6/23 to restart warfarin.  Increase dose today to take 2 tablets and increase dose tomorrow to take 1 1/2 tablets and then change weekly dose to take 1 1/2 tablets daily except take 1 tablet on Tuesday, Thursday and Saturday. Recheck in 2 weeks.

## 2024-04-10 NOTE — Patient Instructions (Addendum)
 Pre visit review using our clinic review tool, if applicable. No additional management support is needed unless otherwise documented below in the visit note.  Increase dose today to take 2 tablets and increase dose tomorrow to take 1 1/2 tablets and then change weekly dose to take 1 1/2 tablets daily except take 1 tablet on Tuesday, Thursday and Saturday. Recheck in 2 weeks.

## 2024-04-15 ENCOUNTER — Ambulatory Visit

## 2024-04-24 ENCOUNTER — Ambulatory Visit

## 2024-04-24 DIAGNOSIS — Z7901 Long term (current) use of anticoagulants: Secondary | ICD-10-CM

## 2024-04-24 LAB — POCT INR: INR: 2.6 (ref 2.0–3.0)

## 2024-04-24 NOTE — Patient Instructions (Addendum)
 Pre visit review using our clinic review tool, if applicable. No additional management support is needed unless otherwise documented below in the visit note.  Continue 1 1/2 tablets daily except take 1 tablet on Tuesday, Thursday and Saturday. Recheck in 4 weeks.

## 2024-04-24 NOTE — Progress Notes (Signed)
 Continue 1 1/2 tablets daily except take 1 tablet on Tuesday, Thursday and Saturday. Recheck in 4 weeks.

## 2024-04-28 DIAGNOSIS — H5462 Unqualified visual loss, left eye, normal vision right eye: Secondary | ICD-10-CM | POA: Diagnosis not present

## 2024-04-28 DIAGNOSIS — C786 Secondary malignant neoplasm of retroperitoneum and peritoneum: Secondary | ICD-10-CM | POA: Diagnosis not present

## 2024-04-28 DIAGNOSIS — H918X3 Other specified hearing loss, bilateral: Secondary | ICD-10-CM | POA: Diagnosis not present

## 2024-04-28 DIAGNOSIS — C220 Liver cell carcinoma: Secondary | ICD-10-CM | POA: Diagnosis not present

## 2024-04-28 DIAGNOSIS — Z87891 Personal history of nicotine dependence: Secondary | ICD-10-CM | POA: Diagnosis not present

## 2024-04-28 DIAGNOSIS — R5383 Other fatigue: Secondary | ICD-10-CM | POA: Diagnosis not present

## 2024-04-28 DIAGNOSIS — G319 Degenerative disease of nervous system, unspecified: Secondary | ICD-10-CM | POA: Diagnosis not present

## 2024-04-28 DIAGNOSIS — E222 Syndrome of inappropriate secretion of antidiuretic hormone: Secondary | ICD-10-CM | POA: Diagnosis not present

## 2024-04-28 DIAGNOSIS — I77810 Thoracic aortic ectasia: Secondary | ICD-10-CM | POA: Diagnosis not present

## 2024-04-28 DIAGNOSIS — R531 Weakness: Secondary | ICD-10-CM | POA: Diagnosis not present

## 2024-04-28 DIAGNOSIS — J90 Pleural effusion, not elsewhere classified: Secondary | ICD-10-CM | POA: Diagnosis not present

## 2024-04-28 DIAGNOSIS — T17320A Food in larynx causing asphyxiation, initial encounter: Secondary | ICD-10-CM | POA: Diagnosis not present

## 2024-04-28 DIAGNOSIS — I088 Other rheumatic multiple valve diseases: Secondary | ICD-10-CM | POA: Diagnosis not present

## 2024-04-28 DIAGNOSIS — R7989 Other specified abnormal findings of blood chemistry: Secondary | ICD-10-CM | POA: Diagnosis not present

## 2024-04-28 DIAGNOSIS — R918 Other nonspecific abnormal finding of lung field: Secondary | ICD-10-CM | POA: Diagnosis not present

## 2024-04-28 DIAGNOSIS — Z8673 Personal history of transient ischemic attack (TIA), and cerebral infarction without residual deficits: Secondary | ICD-10-CM | POA: Diagnosis not present

## 2024-04-28 DIAGNOSIS — R530 Neoplastic (malignant) related fatigue: Secondary | ICD-10-CM | POA: Diagnosis not present

## 2024-04-28 DIAGNOSIS — Z66 Do not resuscitate: Secondary | ICD-10-CM | POA: Diagnosis not present

## 2024-04-28 DIAGNOSIS — I480 Paroxysmal atrial fibrillation: Secondary | ICD-10-CM | POA: Diagnosis not present

## 2024-04-28 DIAGNOSIS — Z7901 Long term (current) use of anticoagulants: Secondary | ICD-10-CM | POA: Diagnosis not present

## 2024-04-28 DIAGNOSIS — J9811 Atelectasis: Secondary | ICD-10-CM | POA: Diagnosis not present

## 2024-04-28 DIAGNOSIS — H547 Unspecified visual loss: Secondary | ICD-10-CM | POA: Diagnosis not present

## 2024-04-28 DIAGNOSIS — R0602 Shortness of breath: Secondary | ICD-10-CM | POA: Diagnosis not present

## 2024-04-28 DIAGNOSIS — I21A1 Myocardial infarction type 2: Secondary | ICD-10-CM | POA: Diagnosis not present

## 2024-04-28 DIAGNOSIS — R627 Adult failure to thrive: Secondary | ICD-10-CM | POA: Diagnosis not present

## 2024-04-28 DIAGNOSIS — I10 Essential (primary) hypertension: Secondary | ICD-10-CM | POA: Diagnosis not present

## 2024-04-28 DIAGNOSIS — Z1152 Encounter for screening for COVID-19: Secondary | ICD-10-CM | POA: Diagnosis not present

## 2024-04-28 DIAGNOSIS — R52 Pain, unspecified: Secondary | ICD-10-CM | POA: Diagnosis not present

## 2024-04-28 DIAGNOSIS — E871 Hypo-osmolality and hyponatremia: Secondary | ICD-10-CM | POA: Diagnosis not present

## 2024-04-28 DIAGNOSIS — Z515 Encounter for palliative care: Secondary | ICD-10-CM | POA: Diagnosis not present

## 2024-04-28 DIAGNOSIS — Z8546 Personal history of malignant neoplasm of prostate: Secondary | ICD-10-CM | POA: Diagnosis not present

## 2024-04-28 DIAGNOSIS — R269 Unspecified abnormalities of gait and mobility: Secondary | ICD-10-CM | POA: Diagnosis not present

## 2024-04-28 DIAGNOSIS — R059 Cough, unspecified: Secondary | ICD-10-CM | POA: Diagnosis not present

## 2024-04-28 DIAGNOSIS — Z8669 Personal history of other diseases of the nervous system and sense organs: Secondary | ICD-10-CM | POA: Diagnosis not present

## 2024-04-28 DIAGNOSIS — H353190 Nonexudative age-related macular degeneration, unspecified eye, stage unspecified: Secondary | ICD-10-CM | POA: Diagnosis not present

## 2024-04-28 DIAGNOSIS — J918 Pleural effusion in other conditions classified elsewhere: Secondary | ICD-10-CM | POA: Diagnosis not present

## 2024-04-28 DIAGNOSIS — C7801 Secondary malignant neoplasm of right lung: Secondary | ICD-10-CM | POA: Diagnosis not present

## 2024-04-28 DIAGNOSIS — R001 Bradycardia, unspecified: Secondary | ICD-10-CM | POA: Diagnosis not present

## 2024-04-28 DIAGNOSIS — R1312 Dysphagia, oropharyngeal phase: Secondary | ICD-10-CM | POA: Diagnosis not present

## 2024-04-28 DIAGNOSIS — C782 Secondary malignant neoplasm of pleura: Secondary | ICD-10-CM | POA: Diagnosis not present

## 2024-04-28 DIAGNOSIS — K769 Liver disease, unspecified: Secondary | ICD-10-CM | POA: Diagnosis not present

## 2024-04-28 DIAGNOSIS — R54 Age-related physical debility: Secondary | ICD-10-CM | POA: Diagnosis not present

## 2024-04-28 DIAGNOSIS — Z7189 Other specified counseling: Secondary | ICD-10-CM | POA: Diagnosis not present

## 2024-04-28 DIAGNOSIS — R1310 Dysphagia, unspecified: Secondary | ICD-10-CM | POA: Diagnosis not present

## 2024-04-28 DIAGNOSIS — I441 Atrioventricular block, second degree: Secondary | ICD-10-CM | POA: Diagnosis not present

## 2024-04-29 DIAGNOSIS — E871 Hypo-osmolality and hyponatremia: Secondary | ICD-10-CM | POA: Diagnosis not present

## 2024-04-29 DIAGNOSIS — R1310 Dysphagia, unspecified: Secondary | ICD-10-CM | POA: Diagnosis not present

## 2024-04-29 DIAGNOSIS — R52 Pain, unspecified: Secondary | ICD-10-CM | POA: Diagnosis not present

## 2024-04-29 DIAGNOSIS — I088 Other rheumatic multiple valve diseases: Secondary | ICD-10-CM | POA: Diagnosis not present

## 2024-04-29 DIAGNOSIS — Z515 Encounter for palliative care: Secondary | ICD-10-CM | POA: Diagnosis not present

## 2024-04-29 DIAGNOSIS — I77810 Thoracic aortic ectasia: Secondary | ICD-10-CM | POA: Diagnosis not present

## 2024-04-29 DIAGNOSIS — Z7189 Other specified counseling: Secondary | ICD-10-CM | POA: Diagnosis not present

## 2024-04-30 DIAGNOSIS — R1310 Dysphagia, unspecified: Secondary | ICD-10-CM | POA: Diagnosis not present

## 2024-04-30 DIAGNOSIS — E871 Hypo-osmolality and hyponatremia: Secondary | ICD-10-CM | POA: Diagnosis not present

## 2024-04-30 DIAGNOSIS — R52 Pain, unspecified: Secondary | ICD-10-CM | POA: Diagnosis not present

## 2024-04-30 DIAGNOSIS — Z7189 Other specified counseling: Secondary | ICD-10-CM | POA: Diagnosis not present

## 2024-04-30 DIAGNOSIS — Z515 Encounter for palliative care: Secondary | ICD-10-CM | POA: Diagnosis not present

## 2024-05-06 ENCOUNTER — Telehealth: Payer: Self-pay | Admitting: Family Medicine

## 2024-05-06 ENCOUNTER — Telehealth: Payer: Self-pay

## 2024-05-06 NOTE — Telephone Encounter (Signed)
 Darice, pt's step daughter called to report pt passed on 05-31-24 peacefully. He was brought to the ER on 7/13 for fatigue.  Gave condolences to her and pt's wife, who was with Darice on the phone. Advised if anything is ever needed in the future to feel free to contact the coumadin  clinic. Both verbalized understanding and were appreciative of the care the pt received at the office and coumadin  clinic.

## 2024-05-06 NOTE — Telephone Encounter (Signed)
 Copied from CRM [id number]. Topic: General - Other >> May 06, 2024 12:28 PM Taleah C wrote: Reason for CRM: pt's step daughter, Darice, called in to inform Dr. Micheal that the patient has recently just passed away.

## 2024-05-13 NOTE — Telephone Encounter (Signed)
 Copied from CRM [id number]. Topic: General - Other >> May 13, 2024  9:17 AM Jasmin G wrote: Reason for CRM: Pt daughter called regarding pt's appt on Aug 4th, she states that she tried to cancel appt but was told to call the clinic, she informed me that her dad passe away last week, if needed call Mrs. Rosaline Glance at [phone]

## 2024-05-17 DEATH — deceased

## 2024-05-20 ENCOUNTER — Ambulatory Visit
